# Patient Record
Sex: Female | Born: 1990 | Race: White | Hispanic: No | Marital: Married | Smoking: Never smoker
Health system: Southern US, Community
[De-identification: ages and names within clinical notes are randomized; demographics above are authoritative.]

## PROBLEM LIST (undated history)

## (undated) DIAGNOSIS — H9191 Unspecified hearing loss, right ear: Secondary | ICD-10-CM

## (undated) DIAGNOSIS — N879 Dysplasia of cervix uteri, unspecified: Secondary | ICD-10-CM

## (undated) DIAGNOSIS — F909 Attention-deficit hyperactivity disorder, unspecified type: Secondary | ICD-10-CM

## (undated) HISTORY — DX: Dysplasia of cervix uteri, unspecified: N87.9

## (undated) HISTORY — DX: Attention-deficit hyperactivity disorder, unspecified type: F90.9

## (undated) HISTORY — PX: TYMPANOPLASTY: SHX33

## (undated) HISTORY — DX: Unspecified hearing loss, right ear: H91.91

## (undated) HISTORY — PX: OTHER SURGICAL HISTORY: SHX169

---

## 2012-01-07 DIAGNOSIS — G43109 Migraine with aura, not intractable, without status migrainosus: Secondary | ICD-10-CM | POA: Insufficient documentation

## 2013-04-28 DIAGNOSIS — H9191 Unspecified hearing loss, right ear: Secondary | ICD-10-CM

## 2013-04-28 HISTORY — DX: Unspecified hearing loss, right ear: H91.91

## 2014-04-28 DIAGNOSIS — N879 Dysplasia of cervix uteri, unspecified: Secondary | ICD-10-CM

## 2014-04-28 DIAGNOSIS — R87619 Unspecified abnormal cytological findings in specimens from cervix uteri: Secondary | ICD-10-CM

## 2014-04-28 HISTORY — DX: Dysplasia of cervix uteri, unspecified: N87.9

## 2014-04-28 HISTORY — DX: Unspecified abnormal cytological findings in specimens from cervix uteri: R87.619

## 2015-04-29 HISTORY — PX: OTHER SURGICAL HISTORY: SHX169

## 2017-02-13 ENCOUNTER — Encounter: Payer: Self-pay | Admitting: Internal Medicine

## 2017-02-13 ENCOUNTER — Ambulatory Visit (INDEPENDENT_AMBULATORY_CARE_PROVIDER_SITE_OTHER): Payer: 59 | Admitting: Internal Medicine

## 2017-02-13 VITALS — BP 102/58 | HR 78 | Ht 62.0 in | Wt 131.4 lb

## 2017-02-13 DIAGNOSIS — F909 Attention-deficit hyperactivity disorder, unspecified type: Secondary | ICD-10-CM

## 2017-02-13 DIAGNOSIS — F901 Attention-deficit hyperactivity disorder, predominantly hyperactive type: Secondary | ICD-10-CM | POA: Insufficient documentation

## 2017-02-13 DIAGNOSIS — F902 Attention-deficit hyperactivity disorder, combined type: Secondary | ICD-10-CM | POA: Diagnosis not present

## 2017-02-13 DIAGNOSIS — R5383 Other fatigue: Secondary | ICD-10-CM

## 2017-02-13 DIAGNOSIS — L300 Nummular dermatitis: Secondary | ICD-10-CM | POA: Insufficient documentation

## 2017-02-13 DIAGNOSIS — R87619 Unspecified abnormal cytological findings in specimens from cervix uteri: Secondary | ICD-10-CM | POA: Diagnosis not present

## 2017-02-13 DIAGNOSIS — H9191 Unspecified hearing loss, right ear: Secondary | ICD-10-CM | POA: Diagnosis not present

## 2017-02-13 DIAGNOSIS — L309 Dermatitis, unspecified: Secondary | ICD-10-CM | POA: Diagnosis not present

## 2017-02-13 NOTE — Progress Notes (Signed)
Date:  02/13/2017   Name:  [name]   DOB:  [birth year]   MRN:  [id number]   Chief Complaint: Establish Care; Lethargic (Feeling sick for the last month. Feeling tired. Feeling like fighting off sickness for long time. Wants bloodwork to make sure everything is okay.); and ADHD (Insurance changed. Can no longer see previous psyc doctor for meds. Needs new referral.  )  HPI Fatigue - for the past month patient's been much less energetic than usual. She may have had some low-grade fevers but no other specific complaints. She has not been going to the gym and believes that she has gained about 10 pounds. There is a family history of thyroid disorder.  ADHD - diagnosed in 2014 to begin medication but had to start in grad school.  Now that she is working, only taking medication PRN when she has to do a lot of paper work.  Abnormal Pap - patient reports onset of abnormal Pap smear about 2 years ago. She has been getting follow-up Pap smears every 6 months and is about 2 months overdue. She does not know the exact diagnostic name of her cellular abnormality.   Review of Systems  Constitutional: Positive for fatigue. Negative for chills and fever.  HENT: Positive for congestion and hearing loss. Nosebleeds: right deafness.   Eyes: Negative for visual disturbance.  Respiratory: Negative for chest tightness, shortness of breath and wheezing.   Cardiovascular: Negative for chest pain, palpitations and leg swelling.  Gastrointestinal: Negative for constipation and diarrhea.  Genitourinary: Negative for dysuria.  Musculoskeletal: Negative for arthralgias and gait problem.  Skin: Positive for rash.  Neurological: Positive for weakness and headaches. Negative for dizziness and light-headedness.  Hematological: Negative for adenopathy.  Psychiatric/Behavioral: Positive for decreased concentration. Negative for dysphoric mood and sleep disturbance.    There are no active problems to  display for this patient.   Prior to Admission medications   Medication Sig Start Date End Date Taking? Authorizing Provider  dextroamphetamine (DEXTROSTAT) 10 MG tablet Take 10 mg by mouth daily.   Yes [provider]  etonogestrel (NEXPLANON) 68 MG IMPL implant 1 each by Subdermal route once.   Yes [provider]    Allergies  Allergen Reactions  . Codeine     Past Surgical History:  Procedure Laterality Date  . wisdom teeth removal      Social History  Substance Use Topics  . Smoking status: Never Smoker  . Smokeless tobacco: Never Used  . Alcohol use 1.2 oz/week    2 Glasses of wine per week     Medication list has been reviewed and updated.  PHQ 2/9 Scores 02/13/2017  PHQ - 2 Score 0    Physical Exam  Constitutional: She is oriented to person, place, and time. She appears well-developed. No distress.  HENT:  Head: Normocephalic and atraumatic.  Right Ear: Ear canal normal. Tympanic membrane is perforated.  Left Ear: Tympanic membrane and ear canal normal.  Nose: Nose normal. Right sinus exhibits no maxillary sinus tenderness. Left sinus exhibits no maxillary sinus tenderness.  Mouth/Throat: No posterior oropharyngeal edema or posterior oropharyngeal erythema.  Neck: Normal range of motion. No thyroid mass and no thyromegaly present.  Cardiovascular: Normal rate, regular rhythm, normal heart sounds and intact distal pulses.   Pulmonary/Chest: Effort normal and breath sounds normal. No respiratory distress. She has no wheezes. She has no rales.  Abdominal: Soft. Normal appearance and bowel sounds are normal. There is no tenderness.  Musculoskeletal: Normal range of motion.  Lymphadenopathy:    She has no cervical adenopathy.  Neurological: She is alert and oriented to person, place, and time. She has normal reflexes.  Skin: Skin is warm and dry. Rash noted. Rash is maculopapular (scaly pale lesions scattered on lower legs).  Psychiatric: She  has a normal mood and affect. Her behavior is normal. Thought content normal.  Nursing note and vitals reviewed.   BP (!) 102/58   Pulse 78   Ht 5\' 2"  (1.575 m)   Wt 131 lb 6.4 oz (59.6 kg)   SpO2 99%   BMI 24.03 kg/m   Assessment and Plan: 1. Abnormal cervical Papanicolaou smear, unspecified abnormal pap finding Obtain records from GYN to determine if she can have Pap with PCP or needs to go to GYN  2. Attention deficit hyperactivity disorder (ADHD), combined type Continue current medications PRN - Ambulatory referral to Psychiatry  3. Fatigue, unspecified type Likely EBV or CMV; rule out hypothyroidism Supportive care, adequate sleep - CBC with Differential/Platelet - Comprehensive metabolic panel - TSH - CMV IgM - Epstein-Barr virus VCA antibody panel  4. Dermatitis Recommend Dermatology follow up - can schedule with specialist of her choice  5. Deafness in right ear   No orders of the defined types were placed in this encounter.   Partially dictated using Animal nutritionist. Any errors are unintentional.  Bari Edward, MD [facility]  02/13/2017

## 2017-02-14 LAB — CBC WITH DIFFERENTIAL/PLATELET
BASOS ABS: 0 10*3/uL (ref 0.0–0.2)
Basos: 0 %
EOS (ABSOLUTE): 0.1 10*3/uL (ref 0.0–0.4)
Eos: 1 %
HEMOGLOBIN: 14.3 g/dL (ref 11.1–15.9)
Hematocrit: 42.5 % (ref 34.0–46.6)
IMMATURE GRANS (ABS): 0 10*3/uL (ref 0.0–0.1)
Immature Granulocytes: 0 %
LYMPHS: 36 %
Lymphocytes Absolute: 2.9 10*3/uL (ref 0.7–3.1)
MCH: 31.1 pg (ref 26.6–33.0)
MCHC: 33.6 g/dL (ref 31.5–35.7)
MCV: 92 fL (ref 79–97)
MONOCYTES: 4 %
Monocytes Absolute: 0.3 10*3/uL (ref 0.1–0.9)
NEUTROS ABS: 4.6 10*3/uL (ref 1.4–7.0)
Neutrophils: 59 %
Platelets: 319 10*3/uL (ref 150–379)
RBC: 4.6 x10E6/uL (ref 3.77–5.28)
RDW: 12.7 % (ref 12.3–15.4)
WBC: 7.9 10*3/uL (ref 3.4–10.8)

## 2017-02-14 LAB — COMPREHENSIVE METABOLIC PANEL
ALBUMIN: 4.6 g/dL (ref 3.5–5.5)
ALK PHOS: 55 IU/L (ref 39–117)
ALT: 13 IU/L (ref 0–32)
AST: 20 IU/L (ref 0–40)
Albumin/Globulin Ratio: 2 (ref 1.2–2.2)
BILIRUBIN TOTAL: 0.5 mg/dL (ref 0.0–1.2)
BUN / CREAT RATIO: 14 (ref 9–23)
BUN: 12 mg/dL (ref 6–20)
CHLORIDE: 101 mmol/L (ref 96–106)
CO2: 25 mmol/L (ref 20–29)
CREATININE: 0.85 mg/dL (ref 0.57–1.00)
Calcium: 9.9 mg/dL (ref 8.7–10.2)
GFR calc Af Amer: 110 mL/min/{1.73_m2} (ref >59)
GFR calc non Af Amer: 96 mL/min/{1.73_m2} (ref >59)
GLUCOSE: 89 mg/dL (ref 65–99)
Globulin, Total: 2.3 g/dL (ref 1.5–4.5)
POTASSIUM: 3.8 mmol/L (ref 3.5–5.2)
SODIUM: 141 mmol/L (ref 134–144)
TOTAL PROTEIN: 6.9 g/dL (ref 6.0–8.5)

## 2017-02-14 LAB — EPSTEIN-BARR VIRUS VCA ANTIBODY PANEL
EBV NA IgG: >600 U/mL — ABNORMAL HIGH (ref 0.0–17.9)
EBV VCA IgG: 170 U/mL — ABNORMAL HIGH (ref 0.0–17.9)
EBV VCA IgM: <36 U/mL (ref 0.0–35.9)

## 2017-02-14 LAB — CMV IGM

## 2017-02-14 LAB — TSH: TSH: 0.904 u[IU]/mL (ref 0.450–4.500)

## 2017-04-13 ENCOUNTER — Ambulatory Visit (INDEPENDENT_AMBULATORY_CARE_PROVIDER_SITE_OTHER): Payer: 59 | Admitting: Psychiatry

## 2017-04-13 ENCOUNTER — Other Ambulatory Visit: Payer: Self-pay

## 2017-04-13 ENCOUNTER — Encounter: Payer: Self-pay | Admitting: Psychiatry

## 2017-04-13 VITALS — BP 115/71 | HR 71 | Temp 97.9°F | Wt 132.4 lb

## 2017-04-13 DIAGNOSIS — F901 Attention-deficit hyperactivity disorder, predominantly hyperactive type: Secondary | ICD-10-CM

## 2017-04-13 MED ORDER — AMPHETAMINE-DEXTROAMPHETAMINE 10 MG PO TABS: 10.0000 mg | ORAL_TABLET | Freq: Two times a day (BID) | ORAL | 0 refills | Status: DC

## 2017-04-13 NOTE — Progress Notes (Signed)
Psychiatric Initial Adult Assessment   Patient Identification: Ronnette Rump MRN:  [id number] Date of Evaluation:  04/13/2017 Referral Source: Tia Alert  Chief Complaint:  " I have ADHD.'  Chief Complaint    Establish Care; ADHD     Visit Diagnosis:    ICD-10-CM   1. Attention deficit hyperactivity disorder (ADHD), predominantly hyperactive type F90.1 amphetamine-dextroamphetamine (ADDERALL) 10 MG tablet    History of Present Illness: Nala is a  26 year old Caucasian female, employed, married, who lives in [place], who presented to the clinic today to establish care.  Auna reports that she has a history of ADHD and she was under the care of Dr.Sarah Leighton Parody in [place].  Parisha also used to see a provider at [facility] in the past.  Marine reports that she struggles with attention and focus as well as impulsivity and hyperactivity.  She reports that she was diagnosed with ADHD when she was very little.  She reports later on she was rediagnosed when she went to grad school.  She struggles with attention and focus on a regular basis, making careless mistakes at work.  Being unable to focus, being hyperactive, interrupting others, falling back on assignments and project deadlines, being forgetful and losing things and so on.  She reports that she used to be on other medications like Dextrostat in the past.  However she is currently on Adderall which has been working well.  Last prescription was filled by her previous provider on 01/16/2017.  She reports that she has been stretching it out a little bit and she continues to have a few pills left from that prescription.  She denies any side effects to her medications.  She denies any restlessness, appetite suppression, weight loss, sleep issues from her medications.  Her vitals were reviewed and they seem to be within normal limits today.  She denies any history of sadness, mood changes, irritability, mania or hypomania,  perceptual disturbances.  She denies any history of trauma.  She denies any anxiety attacks or panic attacks.  She denies any history of alcohol or drug abuse.  She denies any history of medical problems.  She follows up with her PMD on a regular basis.  She had her TSH done recently, it was within normal limits.  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  She reports work is busy around this time of the year.  She reports she enjoys working here and she enjoys her patients.    Associated Signs/Symptoms: Depression Symptoms:  denies (Hypo) Manic Symptoms:  denies Anxiety Symptoms:  denies Psychotic Symptoms:  denies PTSD Symptoms: Negative  Past Psychiatric History: Past hx of ADHD, diagnosed when she was very little.  She was rediagnosed when she went to grad school.  She denies any history of IP mental health admissions.  She denies any history of suicide attempts.  She used to follow-up with Dr.Sarah Leighton Parody.  Previous Psychotropic Medications: Yes , dextrostat  Substance Abuse History in the last 12 months:  No.  Consequences of Substance Abuse: Negative  Past Medical History:  Past Medical History:  Diagnosis Date  . Abnormal cells of cervix 2016   Was told needs PAP every 6 months due to abnormal cells.   . Acquired deafness of right ear 2015  . ADHD     Past Surgical History:  Procedure Laterality Date  . nexplanon  2017  . wisdom teeth removal      Family Psychiatric History: Denies history of mental  health problems in her family.  Denies history of suicide in her family.  Denies history of substance abuse in family.  Family History:  Family History  Problem Relation Age of Onset  . Hypertension Father   . Hyperthyroidism Father   . Lung cancer Maternal Grandmother   . Lung cancer Paternal Grandmother   . Lung cancer Paternal Grandfather     Social History:   Social History   Socioeconomic History  . Marital status: Married    Spouse name:  jonathan  . Number of children: 0  . Years of education: None  . Highest education level: Doctorate  Social Needs  . Financial resource strain: Not hard at all  . Food insecurity - worry: Never true  . Food insecurity - inability: Never true  . Transportation needs - medical: No  . Transportation needs - non-medical: No  Occupational History    Comment: full time  Tobacco Use  . Smoking status: Never Smoker  . Smokeless tobacco: Never Used  Substance and Sexual Activity  . Alcohol use: Yes    Alcohol/week: 1.2 oz    Types: 1 Glasses of wine, 1 Cans of beer per week  . Drug use: No  . Sexual activity: Yes    Birth control/protection: Implant  Other Topics Concern  . None  Social History Narrative  . None    Additional Social History: [place] in [place]. 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 She denies having any children.  Allergies:   Allergies  Allergen Reactions  . Codeine     Metabolic Disorder Labs: No results found for: HGBA1C, MPG No results found for: PROLACTIN No results found for: CHOL, TRIG, HDL, CHOLHDL, VLDL, LDLCALC   Current Medications: Current Outpatient Medications  Medication Sig Dispense Refill  . dextroamphetamine (DEXTROSTAT) 10 MG tablet Take 10 mg by mouth daily.    Marland Kitchen. etonogestrel (NEXPLANON) 68 MG IMPL implant 1 each by Subdermal route once.    Marland Kitchen. amphetamine-dextroamphetamine (ADDERALL) 10 MG tablet Take 1 tablet (10 mg total) by mouth 2 (two) times daily. 60 tablet 0   No current facility-administered medications for this visit.     Neurologic: Headache: No Seizure: No Paresthesias:No  Musculoskeletal: Strength & Muscle Tone: within normal limits Gait & Station: normal Patient leans: N/A  Psychiatric Specialty Exam: Review of Systems  Psychiatric/Behavioral: Negative for depression, hallucinations, substance abuse and suicidal ideas. The patient does not have insomnia.   All other systems reviewed  and are negative.   Blood pressure 115/71, pulse 71, temperature 97.9 F (36.6 C), temperature source Oral, weight 132 lb 6.4 oz (60.1 kg).Body mass index is 24.22 kg/m.  General Appearance: Casual  Eye Contact:  Fair  Speech:  Clear and Coherent  Volume:  Normal  Mood:  euthymic  Affect:  Congruent  Thought Process:  Goal Directed and Descriptions of Associations: Intact  Orientation:  Full (Time, Place, and Person)  Thought Content:  Logical  Suicidal Thoughts:  No  Homicidal Thoughts:  No  Memory:  Immediate;   Fair Recent;   Fair Remote;   Fair  Judgement:  Fair  Insight:  Fair  Psychomotor Activity:  Normal  Concentration:  Concentration: Fair and Attention Span: Fair  Recall:  FiservFair  Fund of Knowledge:Fair  Language: Fair  Akathisia:  No  Handed:  Right  AIMS (if indicated):  NA  Assets:  Communication Skills Desire for Improvement Financial Resources/Insurance Housing Intimacy Leisure Time Physical Health Resilience  Social Support Energy managerTalents/Skills Transportation Vocational/Educational  ADL's:  Intact  Cognition: WNL  Sleep: Fair    Treatment Plan Summary:Harolyn is a 26 year old Caucasian female, who is employed, married, who has a history of ADHD, who presented to the clinic to establish care.  Jearld AdjutantMarina denied any anxiety or other mood symptoms at this time.  She denies a history of trauma.  She denies a history of substance use.  She denies any mental health problems in her family.  She denies any suicidality at this time.  She reports her medications as effective.  Discussed that we need to obtain medical records from her previous provider and she will sign a release today.  Plan as noted below. Medication management and Plan see below Plan  ADHD, hyperactive type Continue Adderall 10 mg p.o. twice daily. We will obtain medical records from her previous provider, she will sign a release.  I have reviewed [place] controlled substance database.  Reviewed vital  signs, within normal limits.  Reviewed TSH - 02/13/2017 - wnl.  Provided medication education provided handouts.  Follow-up in 4 weeks or sooner if needed.  This note was generated in part or whole with voice recognition software. Voice recognition is usually quite accurate but there are transcription errors that can and very often do occur. I apologize for any typographical errors that were not detected and corrected.       Jomarie LongsSaramma Vaanya Shambaugh, MD 12/17/20181:29 PM

## 2017-05-06 ENCOUNTER — Ambulatory Visit: Payer: 59 | Attending: Internal Medicine | Admitting: Occupational Therapy

## 2017-05-06 DIAGNOSIS — M25531 Pain in right wrist: Secondary | ICD-10-CM | POA: Insufficient documentation

## 2017-05-06 NOTE — Therapy (Signed)
[facility] [address] [place], [place], [address] Phone: [phone]   Fax:  [phone]  Occupational Therapy screen  Patient Details  Name: Susan Hoffman MRN: [id number] Date of Birth: 02/01/1991 No Data Recorded  Encounter Date: 05/06/2017    Past Medical History:  Diagnosis Date  . Abnormal cells of cervix 2016   Was told needs PAP every 6 months due to abnormal cells.   . Acquired deafness of right ear 2015  . ADHD     Past Surgical History:  Procedure Laterality Date  . nexplanon  2017  . wisdom teeth removal      There were no vitals filed for this visit.  Subjective Assessment - 05/06/17 1249    Subjective   I walked somebody with gaitbelt about 2 wks ago -and then thy jerk on it and my wrist twitch -and since then hurting and more lately - with weight bearing and gripping , twisting with some popping     Patient Stated Goals  I just don't want it to get worse     Currently in Pain?  -- Pain at rest 0/10 , but after using it or weight bear can go to 7/10        1/10 pain with AROM wrist flexion and extention end range  No pain with AROM RD,UD, sup and pronation -some popping and clicking per pt - but had some prior   Tender over ulnar side of wrist at [facility] - but 1/10  And radial side of wrist  With joint mobs at distal ulna and radial joint some pain but not with traction or compression  Pain with resistance of thumb flexion and ADD   some pain with gripping on volar wrist  Grip on R dominant hand 50 and L55   Recommend for pt to wear Benik neoprene splint most all the time with act - for compression and support at end range  Avoid weight bearing , gripping , pulling  And can do ionto with dexamethazone or pulse US - 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 Abnormal Pap smear of cervix 02/13/2017  . ADHD 02/13/2017  . Dermatitis 02/13/2017  . Deafness in right ear 02/13/2017    Oletta CohnuPreez, Austan Nicholl OTR/L,CLT 05/06/2017, 12:52 PM  [facility] [address] [place], [place], [address] Phone: [phone]   Fax:  [phone]  Name: Susan Hoffman MRN: [id number] Date of Birth: 02/01/1991

## 2017-05-08 ENCOUNTER — Other Ambulatory Visit: Payer: Self-pay

## 2017-05-08 ENCOUNTER — Encounter: Payer: Self-pay | Admitting: Psychiatry

## 2017-05-08 ENCOUNTER — Ambulatory Visit (INDEPENDENT_AMBULATORY_CARE_PROVIDER_SITE_OTHER): Payer: 59 | Admitting: Psychiatry

## 2017-05-08 DIAGNOSIS — F901 Attention-deficit hyperactivity disorder, predominantly hyperactive type: Secondary | ICD-10-CM | POA: Diagnosis not present

## 2017-05-08 MED ORDER — AMPHETAMINE-DEXTROAMPHETAMINE 10 MG PO TABS: 10.0000 mg | ORAL_TABLET | Freq: Two times a day (BID) | ORAL | 0 refills | Status: DC

## 2017-05-08 NOTE — Progress Notes (Signed)
BH MD OP Progress Note  05/08/2017 12:22 PM Susan Hoffman  MRN:  [id number]  Chief Complaint: ' I am ok.'  Chief Complaint    Follow-up; Medication Refill     HPI: Susan Hoffman a 27 year old Caucasian female, employed, married, lives in [place], presented to the clinic today for a follow-up visit.  Susan Hoffman currently is on Adderall for her ADHD symptoms.  She reports she has been doing well on the current dose.  She reports her focus and attention is better.  She denies any impulsivity or hyperactivity.  She denies any side effects to the medication like appetite changes, anxiety, sleep issues.  She reports her mood is okay.  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  She has a sprain to her right forearm.  She currently wears braces.  She reports she has to take pain medications for couple of days and also give her arm rest.  She denies any  new concerns.  She denies abusing any drugs or alcohol. Visit Diagnosis:    ICD-10-CM   1. Attention deficit hyperactivity disorder (ADHD), predominantly hyperactive type F90.1 amphetamine-dextroamphetamine (ADDERALL) 10 MG tablet    DISCONTINUED: amphetamine-dextroamphetamine (ADDERALL) 10 MG tablet    DISCONTINUED: amphetamine-dextroamphetamine (ADDERALL) 10 MG tablet    Past Psychiatric History: History of ADHD, diagnosed when she was very little.  She was rediagnosed when she went to grad school.  She denies any history of IP mental health admissions.  She denies any history of suicide attempts.  She used to follow up with Dr. Mahala MenghiniSarah Bryce in the past.  Previous trials of Dextrostat.  Past Medical History:  Past Medical History:  Diagnosis Date  . Abnormal cells of cervix 2016   Was told needs PAP every 6 months due to abnormal cells.   . Acquired deafness of right ear 2015  . ADHD     Past Surgical History:  Procedure Laterality Date  . nexplanon  2017  . wisdom teeth removal      Family Psychiatric  History: Denies history of mental health problems in her family.  Denies history of suicide in her family.  Denies history of substance abuse in her family  Family History:  Family History  Problem Relation Age of Onset  . Hypertension Father   . Hyperthyroidism Father   . Lung cancer Maternal Grandmother   . Lung cancer Paternal Grandmother   . Lung cancer Paternal Grandfather    Substance abuse history: Denies   Social History: She lives with her husband.  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  She denies having any children.  Her husband is currently in school and also in [employer] and they would hence like to wait another 3-4 years off before having children Social History   Socioeconomic History  . Marital status: Married    Spouse name: jonathan  . Number of children: 0  . Years of education: None  . Highest education level: Doctorate  Social Needs  . Financial resource strain: Not hard at all  . Food insecurity - worry: Never true  . Food insecurity - inability: Never true  . Transportation needs - medical: No  . Transportation needs - non-medical: No  Occupational History    Comment: full time  Tobacco Use  . Smoking status: Never Smoker  . Smokeless tobacco: Never Used  Substance and Sexual Activity  . Alcohol use: Yes    Alcohol/week: 1.2 oz    Types: 1  Glasses of wine, 1 Cans of beer per week  . Drug use: No  . Sexual activity: Yes    Birth control/protection: Implant  Other Topics Concern  . None  Social History Narrative  . None    Allergies:  Allergies  Allergen Reactions  . Codeine     Metabolic Disorder Labs: No results found for: HGBA1C, MPG No results found for: PROLACTIN No results found for: CHOL, TRIG, HDL, CHOLHDL, VLDL, LDLCALC Lab Results  Component Value Date   TSH 0.904 02/13/2017    Therapeutic Level Labs: No results found for: LITHIUM No results found for: VALPROATE No components found for:  CBMZ  Current  Medications: Current Outpatient Medications  Medication Sig Dispense Refill  . amphetamine-dextroamphetamine (ADDERALL) 10 MG tablet Take 1 tablet (10 mg total) by mouth 2 (two) times daily. 60 tablet 0  . etonogestrel (NEXPLANON) 68 MG IMPL implant 1 each by Subdermal route once.     No current facility-administered medications for this visit.      Musculoskeletal: Strength & Muscle Tone: within normal limits Gait & Station: normal Patient leans: N/A  Psychiatric Specialty Exam: Review of Systems  Musculoskeletal: Positive for joint pain (recent sprain of her forearm , wears braces).  All other systems reviewed and are negative.   Blood pressure 128/80, pulse (!) 56, temperature 98.5 F (36.9 C), temperature source Oral, weight 131 lb 12.8 oz (59.8 kg).Body mass index is 24.11 kg/m.  General Appearance: Casual  Eye Contact:  Fair  Speech:  Clear and Coherent  Volume:  Normal  Mood:  Euthymic  Affect:  Congruent  Thought Process:  Goal Directed and Descriptions of Associations: Intact  Orientation:  Full (Time, Place, and Person)  Thought Content: Logical   Suicidal Thoughts:  No  Homicidal Thoughts:  No  Memory:  Immediate;   Fair Recent;   Fair Remote;   Fair  Judgement:  Fair  Insight:  Fair  Psychomotor Activity:  Normal  Concentration:  Concentration: Fair and Attention Span: Fair  Recall:  [employer] of Knowledge: Fair  Language: Fair  Akathisia:  No  Handed:  Right  AIMS (if indicated): NA  Assets:  Communication Skills Desire for Improvement Financial Resources/Insurance Housing Intimacy Leisure Time Physical Health Resilience Social Support Talents/Skills Transportation Vocational/Educational  ADL's:  Intact  Cognition: WNL  Sleep:  Fair   Screenings: PHQ2-9     Office Visit from 02/13/2017 in [facility]  PHQ-2 Total Score  0       Assessment and Plan: Susan Hoffman is a 27 year old Caucasian female who is employed, married, has a  history of ADHD, presented to the clinic for a follow-up visit.  Patient currently reports she is stable on the current dose of Adderall.  She denies any side effects.  Denies any mood symptoms.  Denies any substance abuse problems.  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  Plan as noted below.  Plan ADHD, hyperactive type Continue Adderall 10 mg p.o. twice daily.  Provided 3 scripts with date specified. Pending Medical records from previous provider. Reviewed [place] controlled substance database.  Vital signs reviewed-within normal limits.  Provided medication education, provided handouts.  Follow-up in 2-3 months or sooner if needed  More than 50 % of the time was spent for psychoeducation and supportive psychotherapy and care coordination.  This note was generated in part or whole with voice recognition software. Voice recognition is usually quite accurate but there are transcription errors that  can and very often do occur. I apologize for any typographical errors that were not detected and corrected.      Jomarie Longs, MD 05/08/2017, 12:22 PM

## 2017-06-04 ENCOUNTER — Telehealth: Payer: Self-pay | Admitting: Physician Assistant

## 2017-06-04 ENCOUNTER — Other Ambulatory Visit: Payer: Self-pay

## 2017-06-04 ENCOUNTER — Ambulatory Visit
Admission: EM | Admit: 2017-06-04 | Discharge: 2017-06-04 | Disposition: A | Discharge status: Home / Self Care | Payer: 59 | Attending: Family Medicine | Admitting: Family Medicine

## 2017-06-04 ENCOUNTER — Ambulatory Visit (INDEPENDENT_AMBULATORY_CARE_PROVIDER_SITE_OTHER): Payer: 59

## 2017-06-04 DIAGNOSIS — R079 Chest pain, unspecified: Secondary | ICD-10-CM | POA: Diagnosis not present

## 2017-06-04 DIAGNOSIS — R42 Dizziness and giddiness: Secondary | ICD-10-CM

## 2017-06-04 DIAGNOSIS — R0602 Shortness of breath: Secondary | ICD-10-CM

## 2017-06-04 DIAGNOSIS — Z01810 Encounter for preprocedural cardiovascular examination: Secondary | ICD-10-CM | POA: Diagnosis not present

## 2017-06-04 LAB — COMPREHENSIVE METABOLIC PANEL
ALK PHOS: 49 U/L (ref 38–126)
ALT: 16 U/L (ref 14–54)
AST: 22 U/L (ref 15–41)
Albumin: 4.6 g/dL (ref 3.5–5.0)
Anion gap: 6 (ref 5–15)
BILIRUBIN TOTAL: 1.1 mg/dL (ref 0.3–1.2)
BUN: 10 mg/dL (ref 6–20)
CALCIUM: 9.3 mg/dL (ref 8.9–10.3)
CO2: 26 mmol/L (ref 22–32)
CREATININE: 0.92 mg/dL (ref 0.44–1.00)
Chloride: 104 mmol/L (ref 101–111)
Glucose, Bld: 105 mg/dL — ABNORMAL HIGH (ref 65–99)
Potassium: 3.3 mmol/L — ABNORMAL LOW (ref 3.5–5.1)
Sodium: 136 mmol/L (ref 135–145)
Total Protein: 7.2 g/dL (ref 6.5–8.1)

## 2017-06-04 LAB — CBC WITH DIFFERENTIAL/PLATELET
BASOS ABS: 0 10*3/uL (ref 0–0.1)
Basophils Relative: 0 %
EOS PCT: 1 %
Eosinophils Absolute: 0.1 10*3/uL (ref 0–0.7)
HEMATOCRIT: 41.3 % (ref 35.0–47.0)
HEMOGLOBIN: 14.3 g/dL (ref 12.0–16.0)
LYMPHS ABS: 3.4 10*3/uL (ref 1.0–3.6)
LYMPHS PCT: 51 %
MCH: 31.7 pg (ref 26.0–34.0)
MCHC: 34.7 g/dL (ref 32.0–36.0)
MCV: 91.3 fL (ref 80.0–100.0)
Monocytes Absolute: 0.4 10*3/uL (ref 0.2–0.9)
Monocytes Relative: 6 %
NEUTROS ABS: 2.9 10*3/uL (ref 1.4–6.5)
NEUTROS PCT: 42 %
Platelets: 313 10*3/uL (ref 150–440)
RBC: 4.52 MIL/uL (ref 3.80–5.20)
RDW: 12.1 % (ref 11.5–14.5)
WBC: 6.8 10*3/uL (ref 3.6–11.0)

## 2017-06-04 LAB — FIBRIN DERIVATIVES D-DIMER (ARMC ONLY): FIBRIN DERIVATIVES D-DIMER (ARMC): 114.21 ng{FEU}/mL (ref 0.00–499.00)

## 2017-06-04 MED ORDER — POTASSIUM CHLORIDE CRYS ER 20 MEQ PO TBCR: 20.0000 meq | EXTENDED_RELEASE_TABLET | Freq: Two times a day (BID) | ORAL | 0 refills | Status: DC

## 2017-06-04 NOTE — Discharge Instructions (Signed)
CBC normal.  Metabolic panel normal except for slightly low potassium. Chest xray normal. Rest. If persists, see [facility]  Take care  Dr. Adriana Simasook

## 2017-06-04 NOTE — ED Triage Notes (Signed)
Pt c/o dyspnea. Sx started with a cold about 1.5 weeks ago, and included productive cough. States the cough has resolved. States she is experiencing exertional chest pain described as a "pressure". Denies N/V, neck pain (but is c/o neck stiffness), denies back, arm pain.

## 2017-06-04 NOTE — ED Provider Notes (Signed)
[facility]    CSN: [id number] Arrival date & time: 06/04/17  1811  History   Chief Complaint Chief Complaint  Patient presents with  . Shortness of Breath   HPI  27 year old female presents with shortness of breath.  Patient states that a week and a half ago she had a typical cold.  She states she had cough, runny nose, and fatigue.  She states that she improved.  However, she has had persistent shortness of breath.  She has been doing her normal physical activity which includes a 3 mile run prior to her workout.  She states that she has been progressively getting more short of breath.  She finds her self shortening her run as she cannot complete it.  She states that she feels short of breath.  She reports chest tightness.  Has been worsening and was worse as of yesterday and today.  She reports that she has had some dizziness/lightheadedness as well.  No reports of fevers or chills.  No cough.  No reports of palpitations.  She is very physically active and very healthy at baseline.  She uses Nexplanon for birth control.  She has recently traveled to [place] well as [place].  No reports of calf swelling/Pain.  No other reported symptoms.  No other complaints or concerns at this time.  Past Medical History:  Diagnosis Date  . Abnormal cells of cervix 2016   Was told needs PAP every 6 months due to abnormal cells.   . Acquired deafness of right ear 2015  . ADHD    Patient Active Problem List   Diagnosis Date Noted  . Abnormal Pap smear of cervix 02/13/2017  . ADHD 02/13/2017  . Dermatitis 02/13/2017  . Deafness in right ear 02/13/2017   Past Surgical History:  Procedure Laterality Date  . nexplanon  2017  . wisdom teeth removal     OB History    Gravida Para Term Preterm AB Living   0 0 0 0 0 0   SAB TAB Ectopic Multiple Live Births   0 0 0 0 0     Home Medications    Prior to Admission medications   Medication Sig Start Date End Date Taking?  Authorizing Provider  amphetamine-dextroamphetamine (ADDERALL) 10 MG tablet Take 1 tablet (10 mg total) by mouth 2 (two) times daily. 05/08/17  Yes Jomarie LongsEappen, Saramma, MD  etonogestrel (NEXPLANON) 68 MG IMPL implant 1 each by Subdermal route once.   Yes [provider]  potassium chloride SA (K-DUR,KLOR-CON) 20 MEQ tablet Take 1 tablet (20 mEq total) by mouth 2 (two) times daily for 2 days. 06/04/17 06/06/17  Tommie Samsook, Elsi Stelzer G, DO    Family History Family History  Problem Relation Age of Onset  . Hypertension Father   . Hyperthyroidism Father   . Lung cancer Maternal Grandmother   . Lung cancer Paternal Grandmother   . Lung cancer Paternal Grandfather     Social History Social History   Tobacco Use  . Smoking status: Never Smoker  . Smokeless tobacco: Never Used  Substance Use Topics  . Alcohol use: Yes    Alcohol/week: 1.2 oz    Types: 1 Glasses of wine, 1 Cans of beer per week  . Drug use: No     Allergies   Codeine   Review of Systems Review of Systems  Constitutional: Negative.   Respiratory: Positive for chest tightness and shortness of breath.   Neurological: Positive for dizziness and light-headedness.   Physical  Exam Triage Vital Signs ED Triage Vitals  Enc Vitals Group     BP 06/04/17 1852 116/73     Pulse Rate 06/04/17 1852 66     Resp 06/04/17 1852 16     Temp 06/04/17 1852 98.2 F (36.8 C)     Temp Source 06/04/17 1852 Oral     SpO2 06/04/17 1852 100 %     Weight 06/04/17 1853 128 lb 6.4 oz (58.2 kg)     Height 06/04/17 1853 5\' 2"  (1.575 m)     Head Circumference --      Peak Flow --      Pain Score --      Pain Loc --      Pain Edu? --      Excl. in GC? --    Updated Vital Signs BP 116/73 (BP Location: Left Arm)   Pulse 66   Temp 98.2 F (36.8 C) (Oral)   Resp 16   Ht 5\' 2"  (1.575 m)   Wt 128 lb 6.4 oz (58.2 kg)   LMP 05/21/2017   SpO2 100%   BMI 23.48 kg/m    Physical Exam  Constitutional: She is oriented to person, place, and time.  She appears well-developed. No distress.  HENT:  Head: Normocephalic and atraumatic.  Mouth/Throat: Oropharynx is clear and moist.  Eyes: Conjunctivae are normal. Right eye exhibits no discharge. Left eye exhibits no discharge.  Neck: Neck supple.  Cardiovascular: Normal rate and regular rhythm.  No murmur heard. Pulmonary/Chest: Effort normal and breath sounds normal. She has no wheezes. She has no rales.  Abdominal: Soft. She exhibits no distension. There is no tenderness.  Lymphadenopathy:    She has no cervical adenopathy.  Neurological: She is alert and oriented to person, place, and time.  Skin: Skin is warm. No rash noted.  Psychiatric: She has a normal mood and affect. Her behavior is normal.  Nursing note and vitals reviewed.  UC Treatments / Results  Labs (all labs ordered are listed, but only abnormal results are displayed) Labs Reviewed  COMPREHENSIVE METABOLIC PANEL - Abnormal; Notable for the following components:      Result Value   Potassium 3.3 (*)    Glucose, Bld 105 (*)    All other components within normal limits  CBC WITH DIFFERENTIAL/PLATELET  FIBRIN DERIVATIVES D-DIMER [facility] ONLY)    EKG   Date: 06/04/2017  EKG Time: 8:49 PM  Rate: 62  Rhythm: Normal sinus rhythm with sinus arrhythmia.  Axis: Normal.  Intervals:Normal  ST&T Change: No  Narrative Interpretation: Normal sinus rhythm at a rate of 62.  Sinus arrhythmia.  Normal axis. Normal EKG.  Radiology Dg Chest 2 View  Result Date: 06/04/2017 CLINICAL DATA:  Shortness of breath. EXAM: CHEST  2 VIEW COMPARISON:  None. FINDINGS: The heart size and mediastinal contours are within normal limits. Both lungs are clear. The visualized skeletal structures are unremarkable. IMPRESSION: No active cardiopulmonary disease. Electronically Signed   By: Gerome Sam III M.D   On: 06/04/2017 20:09   Procedures Procedures (including critical care time)  Medications Ordered in UC Medications - No data to  display   Initial Impression / Assessment and Plan / UC Course  I have reviewed the triage vital signs and the nursing notes.  Pertinent labs & imaging results that were available during my care of the patient were reviewed by me and considered in my medical decision making (see chart for details).     27 year old female presents  with shortness of breath.  Etiology remains unclear.  Chest x-ray negative.  CBC normal.  Mild hypokalemia which I will replete.  EKG unremarkable.  Advised rest and supportive care.  If persists, need to see pulmonology.  Final Clinical Impressions(s) / UC Diagnoses   Final diagnoses:  SOB (shortness of breath)    ED Discharge Orders        Ordered    potassium chloride SA (K-DUR,KLOR-CON) 20 MEQ tablet  2 times daily     06/04/17 2044     Controlled Substance Prescriptions [place] Controlled Substance Registry consulted? Not Applicable   Tommie Sams, DO 06/04/17 2050

## 2017-06-04 NOTE — Progress Notes (Signed)
Based on what you shared with me it looks like you have a serious condition that should be evaluated in a face to face office visit.  NOTE: If you entered your credit card information for this eVisit, you will not be charged. You may see a "hold" on your card for the $30 but that hold will drop off and you will not have a charge processed.  If you are having a true medical emergency please call 911.  If you need an urgent face to face visit, [facility] has four urgent care centers for your convenience.  If you need care fast and have a high deductible or no insurance consider:   [email or web address] to reserve your spot online an avoid wait times  [facility] [address], [address] [place], [place] [address] 8 am to 8 pm Monday-Friday 10 am to 4 pm Saturday-Sunday *Across the street from [employer]  [facility]  [address] [place], [address] 8 am to 5 pm Monday-Friday * In the [facility]   The following sites will take your  insurance:  . [facility]  [phone] Get Driving Directions Find a Provider at this Location  [address] [place], [place] [address] . 10 am to 8 pm Monday-Friday . 12 pm to 8 pm Saturday-Sunday   . [facility]  [phone] Get Driving Directions Find a Provider at this Location  [address], [address] [place], [place] [address] . 8 am to 8 pm Monday-Friday . 9 am to 6 pm Saturday . 11 am to 6 pm Sunday   . [facility] [place]  [phone] Get Driving Directions  [address] [place], [place] [address] . 8 am to 8 pm Monday-Friday . 8 am to 4 pm Saturday-Sunday   Your e-visit answers were reviewed by a board certified advanced clinical practitioner to complete your personal care plan.  Thank you for using e-Visits.  

## 2017-06-23 ENCOUNTER — Encounter: Payer: Self-pay | Admitting: Internal Medicine

## 2017-06-23 ENCOUNTER — Ambulatory Visit (INDEPENDENT_AMBULATORY_CARE_PROVIDER_SITE_OTHER): Payer: 59 | Admitting: Internal Medicine

## 2017-06-23 VITALS — BP 118/68 | HR 119 | Ht 62.0 in | Wt 131.0 lb

## 2017-06-23 DIAGNOSIS — R87619 Unspecified abnormal cytological findings in specimens from cervix uteri: Secondary | ICD-10-CM

## 2017-06-23 DIAGNOSIS — Z0001 Encounter for general adult medical examination with abnormal findings: Secondary | ICD-10-CM | POA: Diagnosis not present

## 2017-06-23 DIAGNOSIS — Z3042 Encounter for surveillance of injectable contraceptive: Secondary | ICD-10-CM

## 2017-06-23 DIAGNOSIS — E876 Hypokalemia: Secondary | ICD-10-CM | POA: Diagnosis not present

## 2017-06-23 DIAGNOSIS — R0602 Shortness of breath: Secondary | ICD-10-CM

## 2017-06-23 DIAGNOSIS — Z0000 Encounter for general adult medical examination without abnormal findings: Secondary | ICD-10-CM | POA: Diagnosis not present

## 2017-06-23 LAB — POCT URINALYSIS DIPSTICK
Bilirubin, UA: NEGATIVE
Blood, UA: NEGATIVE
Glucose, UA: NEGATIVE
Ketones, UA: NEGATIVE
Leukocytes, UA: NEGATIVE
NITRITE UA: NEGATIVE
PH UA: 6.5 (ref 5.0–8.0)
PROTEIN UA: NEGATIVE
Spec Grav, UA: 1.01 (ref 1.010–1.025)
UROBILINOGEN UA: 0.2 U/dL

## 2017-06-23 NOTE — Progress Notes (Signed)
Date:  06/23/2017   Name:  Susan Hoffman   DOB:  03-16-1991   MRN:  [id number]   Chief Complaint: Annual Exam (Breast and Papsmear. Needs pap every 6 months for abnormal cells on cervix. ) and Referral (Lives in [place]. [facility]. )  Susan KindsMarina Natasha Delprado is a 27 y.o. female who presents today for her Complete Annual Exam. She feels fairly well. She reports exercising none currently due to shortness of breath. She reports she is sleeping well.  She denies breast problems.  SOB - started several weeks ago.  First with extreme exercise then with less exertion and sometimes now at rest.  She was seen at ED;  D-dimer was negative and other labs normal except for mildly decreased potassium.  She has been referred to Pulmonology.  Contraception - she has Nexplanon which needs to be replaced this year.  She has intermittent menstrual bleeding but no other concerns.  [facility] in [place].  Abnormal Pap - overdue for repeat Pap.  She had abnormal cells and previous biopsy and was just scheduled for yearly or q 6 mo repeats.  Review of Systems  Constitutional: Negative for chills, fatigue and fever.  HENT: Negative for congestion, hearing loss, tinnitus, trouble swallowing and voice change.   Eyes: Negative for visual disturbance.  Respiratory: Positive for chest tightness and shortness of breath. Negative for cough and wheezing.   Cardiovascular: Negative for chest pain, palpitations and leg swelling.  Gastrointestinal: Negative for abdominal pain, constipation, diarrhea and vomiting.  Endocrine: Negative for polydipsia and polyuria.  Genitourinary: Negative for dysuria, frequency, genital sores, vaginal bleeding and vaginal discharge.  Musculoskeletal: Negative for arthralgias, gait problem and joint swelling.  Skin: Negative for color change and rash.  Neurological: Positive for dizziness and  headaches. Negative for tremors and light-headedness.  Hematological: Negative for adenopathy. Does not bruise/bleed easily.  Psychiatric/Behavioral: Negative for dysphoric mood and sleep disturbance. The patient is not nervous/anxious.     Patient Active Problem List   Diagnosis Date Noted  . Abnormal Pap smear of cervix 02/13/2017  . ADHD 02/13/2017  . Dermatitis 02/13/2017  . Deafness in right ear 02/13/2017    Prior to Admission medications   Medication Sig Start Date End Date Taking? Authorizing Provider  amphetamine-dextroamphetamine (ADDERALL) 10 MG tablet Take 1 tablet (10 mg total) by mouth 2 (two) times daily. 05/08/17  Yes Jomarie LongsEappen, Saramma, MD  etonogestrel (NEXPLANON) 68 MG IMPL implant 1 each by Subdermal route once.   Yes [provider]    Allergies  Allergen Reactions  . Codeine     Past Surgical History:  Procedure Laterality Date  . nexplanon  2017  . wisdom teeth removal      Social History   Tobacco Use  . Smoking status: Never Smoker  . Smokeless tobacco: Never Used  Substance Use Topics  . Alcohol use: Yes    Alcohol/week: 1.2 oz    Types: 1 Glasses of wine, 1 Cans of beer per week  . Drug use: No     Medication list has been reviewed and updated.  PHQ 2/9 Scores 02/13/2017  PHQ - 2 Score 0    Physical Exam  Constitutional: She is oriented to person, place, and time. She appears well-developed and well-nourished. No distress.  HENT:  Head: Normocephalic and atraumatic.  Right Ear: Tympanic membrane and ear canal normal.  Left Ear: Tympanic membrane and  ear canal normal.  Nose: Right sinus exhibits no maxillary sinus tenderness. Left sinus exhibits no maxillary sinus tenderness.  Mouth/Throat: Uvula is midline and oropharynx is clear and moist.  Eyes: Conjunctivae and EOM are normal. Right eye exhibits no discharge. Left eye exhibits no discharge. No scleral icterus.  Neck: Normal range of motion. Carotid bruit is not present. No  erythema present. No thyromegaly present.  Cardiovascular: Normal rate, regular rhythm, normal heart sounds and normal pulses.  Pulmonary/Chest: Effort normal. No respiratory distress. She has no wheezes. Right breast exhibits no mass, no nipple discharge, no skin change and no tenderness. Left breast exhibits no mass, no nipple discharge, no skin change and no tenderness.  Abdominal: Soft. Bowel sounds are normal. There is no hepatosplenomegaly. There is no tenderness. There is no CVA tenderness.  Genitourinary: Vagina normal and uterus normal. There is no tenderness, lesion or injury on the right labia. There is no tenderness, lesion or injury on the left labia. Cervix exhibits no motion tenderness, no discharge and no friability. Right adnexum displays no mass, no tenderness and no fullness. Left adnexum displays no mass, no tenderness and no fullness.  Musculoskeletal: Normal range of motion.  Lymphadenopathy:    She has no cervical adenopathy.    She has no axillary adenopathy.  Neurological: She is alert and oriented to person, place, and time. She has normal reflexes. No cranial nerve deficit or sensory deficit.  Skin: Skin is warm, dry and intact. No rash noted.  Psychiatric: She has a normal mood and affect. Her speech is normal and behavior is normal. Thought content normal.  Nursing note and vitals reviewed.   BP 118/68   Pulse (!) 119   Ht 5\' 2"  (1.575 m)   Wt 131 lb (59.4 kg)   SpO2 100%   BMI 23.96 kg/m   Assessment and Plan: 1. Annual physical exam Normal exam - Comprehensive metabolic panel - Lipid panel - POCT urinalysis dipstick  2. Abnormal cervical Papanicolaou smear, unspecified abnormal pap finding Repeated today - Pap IG w/ reflex to HPV when ASC-U  3. Low blood potassium Seen at ED last week - took supplement for a week - Comprehensive metabolic panel  4. Depot contraception Due to be replaced - Ambulatory referral to Obstetrics / Gynecology  5.  Shortness of breath at rest Seeing Pulmonary shortly  No orders of the defined types were placed in this encounter.   Partially dictated using Animal nutritionist. Any errors are unintentional.  Bari Edward, MD [facility]  06/23/2017

## 2017-06-23 NOTE — Patient Instructions (Signed)

## 2017-06-24 ENCOUNTER — Encounter: Payer: Self-pay | Admitting: Pulmonary Disease

## 2017-06-24 ENCOUNTER — Ambulatory Visit (INDEPENDENT_AMBULATORY_CARE_PROVIDER_SITE_OTHER): Payer: 59 | Admitting: Pulmonary Disease

## 2017-06-24 VITALS — BP 110/68 | HR 80 | Ht 62.0 in | Wt 131.0 lb

## 2017-06-24 DIAGNOSIS — R0609 Other forms of dyspnea: Secondary | ICD-10-CM

## 2017-06-24 DIAGNOSIS — R0600 Dyspnea, unspecified: Secondary | ICD-10-CM

## 2017-06-24 LAB — COMPREHENSIVE METABOLIC PANEL
A/G RATIO: 2.2 (ref 1.2–2.2)
ALK PHOS: 49 IU/L (ref 39–117)
ALT: 10 IU/L (ref 0–32)
AST: 16 IU/L (ref 0–40)
Albumin: 4.6 g/dL (ref 3.5–5.5)
BILIRUBIN TOTAL: 0.3 mg/dL (ref 0.0–1.2)
BUN/Creatinine Ratio: 10 (ref 9–23)
BUN: 9 mg/dL (ref 6–20)
CHLORIDE: 104 mmol/L (ref 96–106)
CO2: 22 mmol/L (ref 20–29)
Calcium: 9.2 mg/dL (ref 8.7–10.2)
Creatinine, Ser: 0.86 mg/dL (ref 0.57–1.00)
GFR calc Af Amer: 108 mL/min/{1.73_m2} (ref >59)
GFR, EST NON AFRICAN AMERICAN: 94 mL/min/{1.73_m2} (ref >59)
Globulin, Total: 2.1 g/dL (ref 1.5–4.5)
Glucose: 89 mg/dL (ref 65–99)
POTASSIUM: 4.1 mmol/L (ref 3.5–5.2)
Sodium: 141 mmol/L (ref 134–144)
Total Protein: 6.7 g/dL (ref 6.0–8.5)

## 2017-06-24 LAB — LIPID PANEL
CHOL/HDL RATIO: 2.3 ratio (ref 0.0–4.4)
CHOLESTEROL TOTAL: 158 mg/dL (ref 100–199)
HDL: 70 mg/dL (ref >39)
LDL Calculated: 76 mg/dL (ref 0–99)
TRIGLYCERIDES: 61 mg/dL (ref 0–149)
VLDL Cholesterol Cal: 12 mg/dL (ref 5–40)

## 2017-06-24 MED ORDER — BUDESONIDE-FORMOTEROL FUMARATE 80-4.5 MCG/ACT IN AERO: 2.0000 | INHALATION_SPRAY | Freq: Two times a day (BID) | RESPIRATORY_TRACT | 12 refills | Status: DC

## 2017-06-24 MED ORDER — ALBUTEROL SULFATE HFA 108 (90 BASE) MCG/ACT IN AERS: 1.0000 | INHALATION_SPRAY | Freq: Four times a day (QID) | RESPIRATORY_TRACT | 10 refills | Status: DC | PRN

## 2017-06-24 NOTE — Patient Instructions (Signed)
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  Therefore, we will initiate treatment for this presumptive diagnosis.  I have prescribed Symbicort-2 inhalations twice a day and albuterol inhaler, 1-2 puffs to be used as needed for shortness of breath or chest tightness.  You may also use the albuterol prior to exercise.  Lung function tests (PFTs) have been ordered.  Follow-up in 2-3 weeks.

## 2017-06-24 NOTE — Progress Notes (Signed)
PULMONARY CONSULT NOTE  Requesting MD/Service: Everlene Other, MD Date of initial consultation: 06/24/17 Reason for consultation: Dyspnea  PT PROFILE: 27 y.o. female never smoker, very athletic woman referred for evaluation of 4-5 weeks of exertional dyspnea  HPI:  As above.  Her onset of symptoms started with a "cold" approximately 1 month ago when she had symptoms of cough, sneezing, fatigue for 2-3 days.  Since that time, she has noted increased exertional dyspnea.  She is extremely active and athletic.  She plays competitive soccer and exercises regularly.  In the course of these activities, she notes chest heaviness and tightness.  She is also noted "random" episodes of chest tightness and lightheadedness.  She denies cough, sputum production, pleurodynia, calf tenderness.  She does report occasional ankle edema after being on her feet all day long.  Her evaluation to date has included a d-dimer which was normal and an EKG, also normal.  She is a physical therapist.  She has no significant occupational or environmental exposures.  There is no past history of asthma, allergies, atopy.  Her chest x-ray is reviewed below.  Past Medical History:  Diagnosis Date  . Abnormal cells of cervix 2016   Was told needs PAP every 6 months due to abnormal cells.   . Acquired deafness of right ear 2015  . ADHD     Past Surgical History:  Procedure Laterality Date  . nexplanon  2017  . wisdom teeth removal      MEDICATIONS: I have reviewed all medications and confirmed regimen as documented  Social History   Socioeconomic History  . Marital status: Married    Spouse name: jonathan  . Number of children: 0  . Years of education: Not on file  . Highest education level: Doctorate  Social Needs  . Financial resource strain: Not hard at all  . Food insecurity - worry: Never true  . Food insecurity - inability: Never true  . Transportation needs - medical: No  . Transportation needs - non-medical:  No  Occupational History    Comment: full time  Tobacco Use  . Smoking status: Never Smoker  . Smokeless tobacco: Never Used  Substance and Sexual Activity  . Alcohol use: Yes    Alcohol/week: 1.2 oz    Types: 1 Glasses of wine, 1 Cans of beer per week  . Drug use: No  . Sexual activity: Yes    Birth control/protection: Implant  Other Topics Concern  . Not on file  Social History Narrative  . Not on file    Family History  Problem Relation Age of Onset  . Hypertension Father   . Hyperthyroidism Father   . Lung cancer Maternal Grandmother   . Lung cancer Paternal Grandmother   . Lung cancer Paternal Grandfather     ROS: No fever, myalgias/arthralgias, unexplained weight loss or weight gain No new focal weakness or sensory deficits No otalgia, hearing loss, visual changes, nasal and sinus symptoms, mouth and throat problems No neck pain or adenopathy No abdominal pain, N/V/D, diarrhea, change in bowel pattern No dysuria, change in urinary pattern   Vitals:   06/24/17 0821 06/24/17 0826  BP:  110/68  Pulse:  80  SpO2:  99%  Weight: 59.4 kg (131 lb)   Height: 5\' 2"  (1.575 m)      EXAM:  Gen: WDWN, No overt respiratory distress HEENT: NCAT, sclera white, oropharynx normal Neck: Supple without LAN, thyromegaly, JVD Lungs: Breath sounds are full without wheezes or other adventitious  sounds Cardiovascular: RRR, no murmurs noted Abdomen: Soft, nontender, normal BS Ext: without clubbing, cyanosis, edema Neuro: CNs grossly intact, motor and sensory intact Skin: Limited exam, no lesions noted  DATA:   BMP Latest Ref Rng & Units 06/23/2017 06/04/2017 02/13/2017  Glucose 65 - 99 mg/dL 89 161(W105(H) 89  BUN 6 - 20 mg/dL 9 10 12   Creatinine 0.57 - 1.00 mg/dL 9.600.86 4.540.92 0.980.85  BUN/Creat Ratio 9 - 23 10 - 14  Sodium 134 - 144 mmol/L 141 136 141  Potassium 3.5 - 5.2 mmol/L 4.1 3.3(L) 3.8  Chloride 96 - 106 mmol/L 104 104 101  CO2 20 - 29 mmol/L 22 26 25   Calcium 8.7 - 10.2  mg/dL 9.2 9.3 9.9    CBC Latest Ref Rng & Units 06/04/2017 02/13/2017  WBC 3.6 - 11.0 K/uL 6.8 7.9  Hemoglobin 12.0 - 16.0 g/dL 11.914.3 14.714.3  Hematocrit 82.935.0 - 47.0 % 41.3 42.5  Platelets 150 - 440 K/uL 313 319    CXR 06/04/17: No acute cardiac or pulmonary findings  IMPRESSION:     ICD-10-CM   1. Dyspnea on exertion R06.09 Pulmonary Function Test [facility] Only   Her exertional dyspnea is rather acute onset after a upper respiratory infection.  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  A normal d-dimer virtually rules out pulmonary embolism.  The only other diagnoses to consider would be possible PSVT (noting that these episodes can also occur at rest).  PLAN:  Symbicort inhaler, 2 actuations twice a day.  Rinse mouth after use Albuterol inhaler to be used as needed for chest tightness or shortness of breath.    She may also use it prior to exercise  Follow-up in 3-4 weeks after pulmonary function tests have been performed   Billy Fischeravid Simonds, MD [facility] service Mobile [phone] Pager [phone] 06/24/2017 9:17 AM

## 2017-06-25 LAB — PAP IG W/ RFLX HPV ASCU: PAP Smear Comment: 0

## 2017-06-25 LAB — SPECIMEN STATUS REPORT

## 2017-06-30 ENCOUNTER — Ambulatory Visit: Payer: 59 | Attending: Pulmonary Disease

## 2017-06-30 DIAGNOSIS — R0609 Other forms of dyspnea: Secondary | ICD-10-CM | POA: Diagnosis not present

## 2017-06-30 DIAGNOSIS — R0600 Dyspnea, unspecified: Secondary | ICD-10-CM

## 2017-07-03 ENCOUNTER — Ambulatory Visit (INDEPENDENT_AMBULATORY_CARE_PROVIDER_SITE_OTHER): Payer: 59 | Admitting: Certified Nurse Midwife

## 2017-07-03 ENCOUNTER — Encounter: Payer: Self-pay | Admitting: Certified Nurse Midwife

## 2017-07-03 VITALS — BP 104/67 | HR 64 | Ht 62.0 in | Wt 130.3 lb

## 2017-07-03 DIAGNOSIS — Z30017 Encounter for initial prescription of implantable subdermal contraceptive: Secondary | ICD-10-CM | POA: Diagnosis not present

## 2017-07-03 DIAGNOSIS — Z3046 Encounter for surveillance of implantable subdermal contraceptive: Secondary | ICD-10-CM

## 2017-07-03 MED ORDER — ETONOGESTREL 68 MG ~~LOC~~ IMPL
68.0000 mg | DRUG_IMPLANT | Freq: Once | SUBCUTANEOUS | Status: AC
  Administered 2017-07-03: 68 mg via SUBCUTANEOUS

## 2017-07-03 NOTE — Progress Notes (Signed)
Susan Hoffman is a 27 y.o. year old [id number] Caucasian female here for Nexplanon removal and reinsertion.  She was given informed consent for removal and reinsertion of her Nexplanon. Her Nexplanon was placed 2016, Patient's last menstrual period was 05/26/2017 (approximate).   Risks/benefits/side effects of Nexplanon have been discussed and her questions have been answered.  Specifically, a failure rate of 04/998 has been reported, with an increased failure rate if pt takes St. John's Wort and/or antiseizure medicaitons.    Susan Hoffman is aware of the common side effect of irregular bleeding, which the incidence of decreases over time.  BP 104/67   Pulse 64   Ht 5\' 2"  (1.575 m)   Wt 130 lb 4.8 oz (59.1 kg)   LMP 05/26/2017 (Approximate)   BMI 23.83 kg/m  Patient's last menstrual period was 05/26/2017 (approximate). No results found for this or any previous visit (from the past 24 hour(s)).   Appropriate time out taken. Nexplanon site identified.  Area prepped in usual sterile fashon. Two cc's of 2% lidocaine was used to anesthetize the area. A small stab incision was made right beside the implant on the distal portion.  The Nexplanon rod was grasped using hemostats and removed intact without difficulty.  The area was cleansed again with betadine and the Nexplanon was inserted per manufacturer's recommendations without difficulty.  Steri-strips and a pressure bandage was applied.  There was less than 3 cc blood loss. There were no complications.  The patient tolerated the procedure well.  She was instructed to keep the area clean and dry, remove pressure bandage in 24 hours, and keep insertion site covered with the steri-strips for 3-5 days.  She was given a card indicating date Nexplanon was inserted and date it needs to be removed.   Reviewed red flag symptoms and when to call.   RTC as needed.    Gunnar BullaJenkins Michelle Lawhorn, CNM [facility], [facility]

## 2017-07-03 NOTE — Patient Instructions (Addendum)
Nexplanon Instructions After Insertion   Keep bandage clean and dry for 24 hours   May use ice/Tylenol/Ibuprofen for soreness or pain   If you develop fever, drainage or increased warmth from incision site-contact office immediately  Etonogestrel implant What is this medicine? ETONOGESTREL (et oh noe JES trel) is a contraceptive (birth control) device. It is used to prevent pregnancy. It can be used for up to 3 years. This medicine may be used for other purposes; ask your health care provider or pharmacist if you have questions. COMMON BRAND NAME(S): Implanon, Nexplanon What should I tell my health care provider before I take this medicine? They need to know if you have any of these conditions: -abnormal vaginal bleeding -blood vessel disease or blood clots -cancer of the breast, cervix, or liver -depression -diabetes -gallbladder disease -headaches -heart disease or recent heart attack -high blood pressure -high cholesterol -kidney disease -liver disease -renal disease -seizures -tobacco smoker -an unusual or allergic reaction to etonogestrel, other hormones, anesthetics or antiseptics, medicines, foods, dyes, or preservatives -pregnant or trying to get pregnant -breast-feeding How should I use this medicine? This device is inserted just under the skin on the inner side of your upper arm by a health care professional. Talk to your pediatrician regarding the use of this medicine in children. Special care may be needed. Overdosage: If you think you have taken too much of this medicine contact a poison control center or emergency room at once. NOTE: This medicine is only for you. Do not share this medicine with others. What if I miss a dose? This does not apply. What may interact with this medicine? Do not take this medicine with any of the following medications: -amprenavir -bosentan -fosamprenavir This medicine may also interact with the following  medications: -barbiturate medicines for inducing sleep or treating seizures -certain medicines for fungal infections like ketoconazole and itraconazole -grapefruit juice -griseofulvin -medicines to treat seizures like carbamazepine, felbamate, oxcarbazepine, phenytoin, topiramate -modafinil -phenylbutazone -rifampin -rufinamide -some medicines to treat HIV infection like atazanavir, indinavir, lopinavir, nelfinavir, tipranavir, ritonavir -St. John's wort This list may not describe all possible interactions. Give your health care provider a list of all the medicines, herbs, non-prescription drugs, or dietary supplements you use. Also tell them if you smoke, drink alcohol, or use illegal drugs. Some items may interact with your medicine. What should I watch for while using this medicine? This product does not protect you against HIV infection (AIDS) or other sexually transmitted diseases. You should be able to feel the implant by pressing your fingertips over the skin where it was inserted. Contact your doctor if you cannot feel the implant, and use a non-hormonal birth control method (such as condoms) until your doctor confirms that the implant is in place. If you feel that the implant may have broken or become bent while in your arm, contact your healthcare provider. What side effects may I notice from receiving this medicine? Side effects that you should report to your doctor or health care professional as soon as possible: -allergic reactions like skin rash, itching or hives, swelling of the face, lips, or tongue -breast lumps -changes in emotions or moods -depressed mood -heavy or prolonged menstrual bleeding -pain, irritation, swelling, or bruising at the insertion site -scar at site of insertion -signs of infection at the insertion site such as fever, and skin redness, pain or discharge -signs of pregnancy -signs and symptoms of a blood clot such as breathing problems; changes in  vision; chest pain; severe,   sudden headache; pain, swelling, warmth in the leg; trouble speaking; sudden numbness or weakness of the face, arm or leg -signs and symptoms of liver injury like dark yellow or brown urine; general ill feeling or flu-like symptoms; light-colored stools; loss of appetite; nausea; right upper belly pain; unusually weak or tired; yellowing of the eyes or skin -unusual vaginal bleeding, discharge -signs and symptoms of a stroke like changes in vision; confusion; trouble speaking or understanding; severe headaches; sudden numbness or weakness of the face, arm or leg; trouble walking; dizziness; loss of balance or coordination Side effects that usually do not require medical attention (report to your doctor or health care professional if they continue or are bothersome): -acne -back pain -breast pain -changes in weight -dizziness -general ill feeling or flu-like symptoms -headache -irregular menstrual bleeding -nausea -sore throat -vaginal irritation or inflammation This list may not describe all possible side effects. Call your doctor for medical advice about side effects. You may report side effects to FDA at 1-800-FDA-1088. Where should I keep my medicine? This drug is given in a hospital or clinic and will not be stored at home. NOTE: This sheet is a summary. It may not cover all possible information. If you have questions about this medicine, talk to your doctor, pharmacist, or health care provider.  2018 Elsevier/Gold Standard (2015-11-01 11:19:22)  

## 2017-07-24 ENCOUNTER — Encounter: Payer: Self-pay | Admitting: Pulmonary Disease

## 2017-07-24 ENCOUNTER — Ambulatory Visit (INDEPENDENT_AMBULATORY_CARE_PROVIDER_SITE_OTHER): Payer: 59 | Admitting: Pulmonary Disease

## 2017-07-24 VITALS — BP 110/72 | HR 88 | Ht 62.0 in | Wt 132.0 lb

## 2017-07-24 DIAGNOSIS — J45990 Exercise induced bronchospasm: Secondary | ICD-10-CM | POA: Diagnosis not present

## 2017-07-24 DIAGNOSIS — J4530 Mild persistent asthma, uncomplicated: Secondary | ICD-10-CM | POA: Diagnosis not present

## 2017-07-24 MED ORDER — MONTELUKAST SODIUM 10 MG PO TABS: 10.0000 mg | ORAL_TABLET | Freq: Every day | ORAL | 10 refills | Status: DC

## 2017-07-24 NOTE — Progress Notes (Signed)
PULMONARY OFFICE FOLLOW UP NOTE  Requesting MD/Service: Everlene OtherJayce Cook, MD Date of initial consultation: 06/24/17 Reason for consultation: Dyspnea  PT PROFILE: 27 y.o. female never smoker, very athletic woman referred for evaluation of 4-5 weeks of exertional dyspnea  DATA: 06/30/17 PFTs: FVC:  4.29 L (128 %pred), FEV1:  3.07 L (106 %pred), FEV1/FVC: 72%, TLC: 5.84 L (123 %pred), DLCO 89 %pred    SUBJ:  Routine re-eval. Reports that she is "75% better". Does not believe that Symbicort has helped much but notices improvement with albuterol during times of exercise (plays competitive soccer). No new complaints. Denies CP, fever, purulent sputum, hemoptysis, LE edema and calf tenderness    Vitals:   07/24/17 1053 07/24/17 1057  BP:  110/72  Pulse:  88  SpO2:  97%  Weight: 132 lb (59.9 kg)   Height: 5\' 2"  (1.575 m)   RA   EXAM:  Gen: NAD HEENT: WNL Lungs: BS full without wheezes or other adventitious sounds Cardiovascular: Reg, no M Abdomen: Soft, nontender, normal BS Ext: no C/C/E Neuro: grossly intact  DATA:   BMP Latest Ref Rng & Units 06/23/2017 06/04/2017 02/13/2017  Glucose 65 - 99 mg/dL 89 960(A105(H) 89  BUN 6 - 20 mg/dL 9 10 12   Creatinine 0.57 - 1.00 mg/dL 5.400.86 9.810.92 1.910.85  BUN/Creat Ratio 9 - 23 10 - 14  Sodium 134 - 144 mmol/L 141 136 141  Potassium 3.5 - 5.2 mmol/L 4.1 3.3(L) 3.8  Chloride 96 - 106 mmol/L 104 104 101  CO2 20 - 29 mmol/L 22 26 25   Calcium 8.7 - 10.2 mg/dL 9.2 9.3 9.9    CBC Latest Ref Rng & Units 06/04/2017 02/13/2017  WBC 3.6 - 11.0 K/uL 6.8 7.9  Hemoglobin 12.0 - 16.0 g/dL 47.814.3 29.514.3  Hematocrit 62.135.0 - 47.0 % 41.3 42.5  Platelets 150 - 440 K/uL 313 319    CXR: NNF  IMPRESSION:     ICD-10-CM   1. Mild persistent asthma without complication J45.30   2. 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

## 2017-07-24 NOTE — Patient Instructions (Signed)
Continue Symbicort for now Add Singulair (montelukast) 10 mg daily taken at bedtime Continue albuterol rescue inhaler as you are currently using it In 3-4 weeks, you may try discontinuation of Symbicort noting its effect on your respiratory symptoms Follow-up in 3 months.  You may call in the interim with any questions or updates.

## 2017-08-07 ENCOUNTER — Ambulatory Visit: Payer: 59 | Admitting: Psychiatry

## 2017-08-31 ENCOUNTER — Encounter: Payer: Self-pay | Admitting: Family Medicine

## 2017-08-31 ENCOUNTER — Ambulatory Visit: Payer: Self-pay | Admitting: Family Medicine

## 2017-08-31 VITALS — BP 110/62 | HR 74 | Temp 98.4°F | Wt 133.0 lb

## 2017-08-31 DIAGNOSIS — J309 Allergic rhinitis, unspecified: Secondary | ICD-10-CM

## 2017-08-31 DIAGNOSIS — J029 Acute pharyngitis, unspecified: Secondary | ICD-10-CM

## 2017-08-31 LAB — POCT RAPID STREP A (OFFICE): Rapid Strep A Screen: NEGATIVE

## 2017-08-31 MED ORDER — IPRATROPIUM BROMIDE 0.03 % NA SOLN: 2.0000 | Freq: Two times a day (BID) | NASAL | 0 refills | Status: DC

## 2017-08-31 MED ORDER — LEVOCETIRIZINE DIHYDROCHLORIDE 5 MG PO TABS: 5.0000 mg | ORAL_TABLET | Freq: Every evening | ORAL | 0 refills | Status: DC

## 2017-08-31 MED ORDER — IBUPROFEN 800 MG PO TABS: 800.0000 mg | ORAL_TABLET | Freq: Two times a day (BID) | ORAL | 0 refills | Status: DC | PRN

## 2017-08-31 NOTE — Progress Notes (Signed)
Subjective:  Susan Hoffman is a 27 y.o. female who presents for evaluation of sore throat and rhinorrhea. Symptoms include a one day history of hoarseness. Symptoms has been gradually worsening since that time.  Treatment to date:  oral throat pain relief which temporarily improved symptoms.  High risk factors for influenza complications:  none.  The following portions of the patient's history were reviewed and updated as appropriate:  allergies, current medications and past medical history.  Pertinent items are noted in HPI. Objective:  Physical Exam  Constitutional: She is well-developed, well-nourished, and in no distress.  HENT:  Head: Normocephalic and atraumatic.  Right Ear: External ear normal.  Nose: Mucosal edema and rhinorrhea present. Right sinus exhibits no maxillary sinus tenderness and no frontal sinus tenderness. Left sinus exhibits no maxillary sinus tenderness and no frontal sinus tenderness.  Mouth/Throat: Uvula is midline. Posterior oropharyngeal erythema present. No oropharyngeal exudate or posterior oropharyngeal edema.  hoariness of voice noted during exam   Cardiovascular: Normal rate, regular rhythm, normal heart sounds and intact distal pulses.  Pulmonary/Chest: Effort normal and breath sounds normal. She has no wheezes.  Vitals reviewed.  Assessment and Plan:  1. Sore throat 2. Allergic rhinitis, unspecified seasonality, unspecified trigger  Current episode is acute and symptoms present for approximately 24 hours. Treating symptomatically. Recommended one day of voice-rest in order to improve/resolve hoarseness symptoms. Discussed the importance of avoiding unnecessary antibiotic therapy. See medication orders:  Meds ordered this encounter  Medications  . levocetirizine (XYZAL) 5 MG tablet    Sig: Take 1 tablet (5 mg total) by mouth every evening.    Dispense:  60 tablet    Refill:  0  . ipratropium (ATROVENT) 0.03 % nasal spray    Sig: Place 2 sprays  into both nostrils 2 (two) times daily.    Dispense:  30 mL    Refill:  0  . ibuprofen (ADVIL,MOTRIN) 800 MG tablet    Sig: Take 1 tablet (800 mg total) by mouth every 12 (twelve) hours as needed (throat pain).    Dispense:  30 tablet    Refill:  0     -The patient was given clear instructions to go to ER or return to medical center if symptoms do not improve, worsen or new problems develop. The patient verbalized understanding.   Godfrey Pick. Tiburcio Pea, MSN, FNP-C [facility] [address]   [place], [place] [address] [phone]

## 2017-08-31 NOTE — Patient Instructions (Signed)
Allergic Rhinitis, Adult Allergic rhinitis is an allergic reaction that affects the mucous membrane inside the nose. It causes sneezing, a runny or stuffy nose, and the feeling of mucus going down the back of the throat (postnasal drip). Allergic rhinitis can be mild to severe. There are two types of allergic rhinitis:  Seasonal. This type is also called hay fever. It happens only during certain seasons.  Perennial. This type can happen at any time of the year.  What are the causes? This condition happens when the body's defense system (immune system) responds to certain harmless substances called allergens as though they were germs.  Seasonal allergic rhinitis is triggered by pollen, which can come from grasses, trees, and weeds. Perennial allergic rhinitis may be caused by:  House dust mites.  Pet dander.  Mold spores.  What are the signs or symptoms? Symptoms of this condition include:  Sneezing.  Runny or stuffy nose (nasal congestion).  Postnasal drip.  Itchy nose.  Tearing of the eyes.  Trouble sleeping.  Daytime sleepiness.  How is this diagnosed? This condition may be diagnosed based on:  Your medical history.  A physical exam.  Tests to check for related conditions, such as: ? Asthma. ? Pink eye. ? Ear infection. ? Upper respiratory infection.  Tests to find out which allergens trigger your symptoms. These may include skin or blood tests.  How is this treated? There is no cure for this condition, but treatment can help control symptoms. Treatment may include:  Taking medicines that block allergy symptoms, such as antihistamines. Medicine may be given as a shot, nasal spray, or pill.  Avoiding the allergen.  Desensitization. This treatment involves getting ongoing shots until your body becomes less sensitive to the allergen. This treatment may be done if other treatments do not help.  If taking medicine and avoiding the allergen does not work,  new, stronger medicines may be prescribed.  Follow these instructions at home:  Find out what you are allergic to. Common allergens include smoke, dust, and pollen.  Avoid the things you are allergic to. These are some things you can do to help avoid allergens: ? Replace carpet with wood, tile, or vinyl flooring. Carpet can trap dander and dust. ? Do not smoke. Do not allow smoking in your home. ? Change your heating and air conditioning filter at least once a month. ? During allergy season:  Keep windows closed as much as possible.  Plan outdoor activities when pollen counts are lowest. This is usually during the evening hours.  When coming indoors, change clothing and shower before sitting on furniture or bedding.  Take over-the-counter and prescription medicines only as told by your health care provider.  Keep all follow-up visits as told by your health care provider. This is important. Contact a health care provider if:  You have a fever.  You develop a persistent cough.  You make whistling sounds when you breathe (you wheeze).  Your symptoms interfere with your normal daily activities. Get help right away if:  You have shortness of breath. Summary  This condition can be managed by taking medicines as directed and avoiding allergens.  Contact your health care provider if you develop a persistent cough or fever.  During allergy season, keep windows closed as much as possible. This information is not intended to replace advice given to you by your health care provider. Make sure you discuss any questions you have with your health care provider. Document Released: 01/07/2001 Document Revised:  05/22/2016 Document Reviewed: 05/22/2016 Elsevier Interactive Patient Education  2018 Elsevier Inc.  Sore Throat When you have a sore throat, your throat may:  Hurt.  Burn.  Feel irritated.  Feel scratchy.  Many things can cause a sore throat, including:  An  infection.  Allergies.  Dryness in the air.  Smoke or pollution.  Gastroesophageal reflux disease (GERD).  A tumor.  A sore throat can be the first sign of another sickness. It can happen with other problems, like coughing or a fever. Most sore throats go away without treatment. Follow these instructions at home:  Take over-the-counter medicines only as told by your doctor.  Drink enough fluids to keep your pee (urine) clear or pale yellow.  Rest when you feel you need to.  To help with pain, try: ? Sipping warm liquids, such as broth, herbal tea, or warm water. ? Eating or drinking cold or frozen liquids, such as frozen ice pops. ? Gargling with a salt-water mixture 3-4 times a day or as needed. To make a salt-water mixture, add -1 tsp of salt in 1 cup of warm water. Mix it until you cannot see the salt anymore. ? Sucking on hard candy or throat lozenges. ? Putting a cool-mist humidifier in your bedroom at night. ? Sitting in the bathroom with the door closed for 5-10 minutes while you run hot water in the shower.  Do not use any tobacco products, such as cigarettes, chewing tobacco, and e-cigarettes. If you need help quitting, ask your doctor. Contact a doctor if:  You have a fever for more than 2-3 days.  You keep having symptoms for more than 2-3 days.  Your throat does not get better in 7 days.  You have a fever and your symptoms suddenly get worse. Get help right away if:  You have trouble breathing.  You cannot swallow fluids, soft foods, or your saliva.  You have swelling in your throat or neck that gets worse.  You keep feeling like you are going to throw up (vomit).  You keep throwing up. This information is not intended to replace advice given to you by your health care provider. Make sure you discuss any questions you have with your health care provider. Document Released: 01/22/2008 Document Revised: 12/09/2015 Document Reviewed: 02/02/2015 Elsevier  Interactive Patient Education  [employer].

## 2017-09-03 ENCOUNTER — Telehealth: Payer: Self-pay | Admitting: Emergency Medicine

## 2017-09-03 NOTE — Telephone Encounter (Signed)
Left message follow up call from [facility] visit 

## 2017-09-04 ENCOUNTER — Ambulatory Visit: Payer: 59 | Admitting: Psychiatry

## 2017-09-07 ENCOUNTER — Telehealth: Payer: 59 | Admitting: Family Medicine

## 2017-09-07 DIAGNOSIS — J40 Bronchitis, not specified as acute or chronic: Secondary | ICD-10-CM

## 2017-09-07 MED ORDER — PREDNISONE 20 MG PO TABS: 40.0000 mg | ORAL_TABLET | Freq: Every day | ORAL | 0 refills | Status: AC

## 2017-09-07 NOTE — Progress Notes (Signed)

## 2017-09-24 ENCOUNTER — Encounter: Payer: Self-pay | Admitting: Psychiatry

## 2017-09-24 ENCOUNTER — Other Ambulatory Visit: Payer: Self-pay

## 2017-09-24 ENCOUNTER — Ambulatory Visit (INDEPENDENT_AMBULATORY_CARE_PROVIDER_SITE_OTHER): Payer: 59 | Admitting: Psychiatry

## 2017-09-24 VITALS — BP 105/72 | HR 88 | Temp 97.9°F | Wt 135.2 lb

## 2017-09-24 DIAGNOSIS — F901 Attention-deficit hyperactivity disorder, predominantly hyperactive type: Secondary | ICD-10-CM | POA: Diagnosis not present

## 2017-09-24 MED ORDER — AMPHETAMINE-DEXTROAMPHETAMINE 10 MG PO TABS: 10.0000 mg | ORAL_TABLET | Freq: Two times a day (BID) | ORAL | 0 refills | Status: DC

## 2017-09-24 NOTE — Progress Notes (Signed)
[facility] MD OP Progress Note  09/24/2017 9:51 AM Susan Hoffman  MRN:  [id number]  Chief Complaint: ' I am here for follow up." Chief Complaint    Follow-up; Medication Refill     HPI: Susan Hoffman is a 27 year old Caucasian female, employed, married, lives in [place], presented to the clinic today for a follow-up visit.  Patient today reports she is currently doing well on the Adderall.  She reports she had bronchiolitis last month and hence did not take the medication for a while.  She reports hence she had to reschedule her appointment.  She reports she does not have any side effects to the medications.  She denies any appetite changes, anxiety symptoms, sleep issues and so on.  Patient denies any suicidality.  Patient denies any perceptual disturbances.  Patient continues to stay away from drugs and alcohol.  Patient reports she has been trying to exercise and to lose some weight.  She denies any new concerns.   Visit Diagnosis:    ICD-10-CM   1. 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  I have reviewed past psychiatric history from my progress note on 05/08/2017.  Past Medical History:  Past Medical History:  Diagnosis Date  . Abnormal cells of cervix 2016   Was told needs PAP every 6 months due to abnormal cells.   . Acquired deafness of right ear 2015  . ADHD     Past Surgical History:  Procedure Laterality Date  . nexplanon  2017  . TYMPANOPLASTY    . wisdom teeth removal      Family Psychiatric History: Reviewed family psychiatric history from my progress note on 05/08/2017.  Family History:  Family History  Problem Relation Age of Onset  . Hypertension Father   . Hyperthyroidism Father   . Lung cancer Maternal Grandmother   . Lung cancer Paternal Grandmother   . Lung cancer Paternal Grandfather   . Cancer  Maternal Aunt   . Ovarian cancer Neg Hx   . AAA (abdominal aortic aneurysm) Neg Hx   . Colon cancer Neg Hx   . Diabetes Neg Hx    Substance abuse history: Denies  Social History: She lives with her husband in [place].  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  She denies having children. Social History   Socioeconomic History  . Marital status: Married    Spouse name: jonathan  . Number of children: 0  . Years of education: Not on file  . Highest education level: Doctorate  Occupational History    Comment: full time  Social Needs  . Financial resource strain: Not hard at all  . Food insecurity:    Worry: Never true    Inability: Never true  . Transportation needs:    Medical: No    Non-medical: No  Tobacco Use  . Smoking status: Never Smoker  . Smokeless tobacco: Never Used  Substance and Sexual Activity  . Alcohol use: Yes    Alcohol/week: 1.2 oz    Types: 1 Glasses of wine, 1 Cans of beer per week    Comment: wine weekly  . Drug use: No  . Sexual activity: Yes    Birth control/protection: Implant  Lifestyle  . Physical activity:    Days per week: 4 days    Minutes per session: 60 min  . Stress: Only a little  Relationships  . Social connections:  Talks on phone: More than three times a week    Gets together: Three times a week    Attends religious service: Never    Active member of club or organization: Yes    Attends meetings of clubs or organizations: More than 4 times per year    Relationship status: Married  Other Topics Concern  . Not on file  Social History Narrative  . Not on file    Allergies:  Allergies  Allergen Reactions  . Codeine     Metabolic Disorder Labs: No results found for: HGBA1C, MPG No results found for: PROLACTIN Lab Results  Component Value Date   CHOL 158 06/23/2017   TRIG 61 06/23/2017   HDL 70 06/23/2017   CHOLHDL 2.3 06/23/2017   LDLCALC 76 06/23/2017   Lab Results  Component Value Date   TSH 0.904  02/13/2017    Therapeutic Level Labs: No results found for: LITHIUM No results found for: VALPROATE No components found for:  CBMZ  Current Medications: Current Outpatient Medications  Medication Sig Dispense Refill  . amphetamine-dextroamphetamine (ADDERALL) 10 MG tablet Take 1 tablet (10 mg total) by mouth 2 (two) times daily. 60 tablet 0  . SF 1.1 % GEL dental gel   2  . [START ON 10/24/2017] amphetamine-dextroamphetamine (ADDERALL) 10 MG tablet Take 1 tablet (10 mg total) by mouth 2 (two) times daily. 60 tablet 0  . [START ON 11/22/2017] amphetamine-dextroamphetamine (ADDERALL) 10 MG tablet Take 1 tablet (10 mg total) by mouth 2 (two) times daily. 60 tablet 0   No current facility-administered medications for this visit.      Musculoskeletal: Strength & Muscle Tone: within normal limits Gait & Station: normal Patient leans: N/A  Psychiatric Specialty Exam: Review of Systems  Psychiatric/Behavioral: Negative for depression, hallucinations, substance abuse and suicidal ideas. The patient is not nervous/anxious.   All other systems reviewed and are negative.   Blood pressure 105/72, pulse 88, temperature 97.9 F (36.6 C), temperature source Oral, weight 135 lb 3.2 oz (61.3 kg).Body mass index is 24.73 kg/m.  General Appearance: Casual  Eye Contact:  Fair  Speech:  Clear and Coherent  Volume:  Normal  Mood:  Euthymic  Affect:  Congruent  Thought Process:  Goal Directed and Descriptions of Associations: Intact  Orientation:  Full (Time, Place, and Person)  Thought Content: Logical   Suicidal Thoughts:  No  Homicidal Thoughts:  No  Memory:  Immediate;   Fair Recent;   Fair Remote;   Fair  Judgement:  Fair  Insight:  Fair  Psychomotor Activity:  Normal  Concentration:  Concentration: Fair and Attention Span: Fair  Recall:  [employer] of Knowledge: Fair  Language: Fair  Akathisia:  No  Handed:  Right  AIMS (if indicated): na  Assets:  Communication Skills Desire  for Improvement Social Support Talents/Skills Transportation  ADL's:  Intact  Cognition: WNL  Sleep:  Fair   Screenings: PHQ2-9     Office Visit from 02/13/2017 in [facility]  PHQ-2 Total Score  0       Assessment and Plan: [name] is a 27 year old Caucasian female, employed, married, history of ADHD, presented to the clinic today for a follow-up visit.  Patient reports she is currently doing well on the Adderall.  She continues to deny any side effects.  Continue plan as noted below.  Plan  ADHD hyperactive type Continue Adderall 10 mg p.o. twice daily.  Provided 3 prescription with date specified.  The last one  to be filled on 11/22/2017. Reviewed [place] controlled substance database.  Reviewed vital signs-within normal limits.  Follow-up in clinic in 3 months or sooner if needed.  More than 50 % of the time was spent for psychoeducation and supportive psychotherapy and care coordination.  This note was generated in part or whole with voice recognition software. Voice recognition is usually quite accurate but there are transcription errors that can and very often do occur. I apologize for any typographical errors that were not detected and corrected.         Jomarie Longs, MD 09/25/2017, 9:11 AM

## 2017-12-11 ENCOUNTER — Ambulatory Visit: Payer: Self-pay | Admitting: Family Medicine

## 2017-12-11 ENCOUNTER — Encounter: Payer: Self-pay | Admitting: Family Medicine

## 2017-12-11 ENCOUNTER — Encounter: Payer: Self-pay | Admitting: Internal Medicine

## 2017-12-11 ENCOUNTER — Telehealth: Payer: 59 | Admitting: Nurse Practitioner

## 2017-12-11 DIAGNOSIS — L989 Disorder of the skin and subcutaneous tissue, unspecified: Secondary | ICD-10-CM

## 2017-12-11 NOTE — Progress Notes (Signed)
Thanks for picture. Not 100% sure what that is, but it is not something I can treat in an e visit.  Based on what you shared with me it looks like you have a serious condition that should be evaluated in a face to face office visit.  NOTE: If you entered your credit card information for this eVisit, you will not be charged. You may see a "hold" on your card for the $30 but that hold will drop off and you will not have a charge processed.  If you are having a true medical emergency please call 911.  If you need an urgent face to face visit, [facility] has four urgent care centers for your convenience.  If you need care fast and have a high deductible or no insurance consider:   [email or web address] to reserve your spot online an avoid wait times  [facility] [address], [address] [place], [place] [address] 8 am to 8 pm Monday-Friday 10 am to 4 pm Saturday-Sunday *Across the street from [employer]  [facility]  [address] [place], [id number] 8 am to 5 pm Monday-Friday * In the [facility] on the [facility]   The following sites will take your  insurance:  . [facility]  [phone] Get Driving Directions Find a Provider at this Location  [address] [place], [place] [address] . 10 am to 8 pm Monday-Friday . 12 pm to 8 pm Saturday-Sunday   . [facility]  [phone] Get Driving Directions Find a Provider at this Location  [address] [place], [place] [address] . 8 am to 8 pm Monday-Friday . 9 am to 6 pm Saturday . 11 am to 6 pm Sunday   . [facility]  [phone] Get Driving Directions  [address] [place], [place] [address] . 8 am to 8 pm Monday-Friday . 8 am to 4 pm Saturday-Sunday   Your e-visit answers were reviewed by a board certified advanced clinical practitioner to complete your  personal care plan.  Thank you for using e-Visits.

## 2017-12-11 NOTE — Progress Notes (Signed)
Patient referred back to PCP for evaluation of chronic foot issue suspected to be plantar wart present for over 1 year and with acute onset pain x 1 day.  Referred back to PCP for referral to podiatry.

## 2017-12-24 ENCOUNTER — Other Ambulatory Visit: Payer: Self-pay

## 2017-12-24 ENCOUNTER — Ambulatory Visit (INDEPENDENT_AMBULATORY_CARE_PROVIDER_SITE_OTHER): Payer: 59 | Admitting: Psychiatry

## 2017-12-24 ENCOUNTER — Encounter: Payer: Self-pay | Admitting: Psychiatry

## 2017-12-24 VITALS — BP 102/67 | HR 73 | Temp 98.8°F | Wt 139.2 lb

## 2017-12-24 DIAGNOSIS — F901 Attention-deficit hyperactivity disorder, predominantly hyperactive type: Secondary | ICD-10-CM | POA: Diagnosis not present

## 2017-12-24 MED ORDER — AMPHETAMINE-DEXTROAMPHETAMINE 10 MG PO TABS: 10.0000 mg | ORAL_TABLET | Freq: Two times a day (BID) | ORAL | 0 refills | Status: DC

## 2017-12-24 NOTE — Progress Notes (Signed)
BH MD OP Progress Note  12/24/2017 9:14 AM Susan Hoffman  MRN:  [id number]  Chief Complaint:  ' I am here for follow up." Chief Complaint    Follow-up; Medication Refill     HPI: Susan Hoffman is a 27 year old Caucasian female, employed, married, lives in [place], presented to the clinic today for a follow-up visit.  Patient today reports she is doing well on her medications.  She reports her focus and attention as good.  She reports work is going well.  She reports she continues to exercise on a regular basis.  She denies any side effects to the medications.  She reports sleep is good.  Patient denies any suicidality.  Patient continues to have good social support from [facility].  Patient continues to stay away from illicit drugs and alcohol. Visit Diagnosis:    ICD-10-CM   1. 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  I have reviewed psychiatric history from my progress note on 05/08/2017  Past Medical History:  Past Medical History:  Diagnosis Date  . Abnormal cells of cervix 2016   Was told needs PAP every 6 months due to abnormal cells.   . Acquired deafness of right ear 2015  . ADHD     Past Surgical History:  Procedure Laterality Date  . nexplanon  2017  . TYMPANOPLASTY    . wisdom teeth removal      Family Psychiatric History: Reviewed family psychiatric history from my progress note on 05/08/2017.  Family History:  Family History  Problem Relation Age of Onset  . Hypertension Father   . Hyperthyroidism Father   . Lung cancer Maternal Grandmother   . Lung cancer Paternal Grandmother   . Lung cancer Paternal Grandfather   . Cancer Maternal Aunt   . Ovarian cancer Neg Hx   . AAA (abdominal aortic  aneurysm) Neg Hx   . Colon cancer Neg Hx   . Diabetes Neg Hx     Social History: Reviewed social history from my progress note on 05/08/2017. Social History   Socioeconomic History  . Marital status: Married    Spouse name: jonathan  . Number of children: 0  . Years of education: Not on file  . Highest education level: Doctorate  Occupational History    Comment: full time  Social Needs  . Financial resource strain: Not hard at all  . Food insecurity:    Worry: Never true    Inability: Never true  . Transportation needs:    Medical: No    Non-medical: No  Tobacco Use  . Smoking status: Never Smoker  . Smokeless tobacco: Never Used  Substance and Sexual Activity  . Alcohol use: Yes    Alcohol/week: 2.0 standard drinks    Types: 1 Glasses of wine, 1 Cans of beer per week    Comment: wine weekly  . Drug use: No  . Sexual activity: Yes    Birth control/protection: Implant  Lifestyle  . Physical activity:    Days per week: 4 days    Minutes per session: 60 min  . Stress: Only a little  Relationships  . Social connections:    Talks on phone: More than three times a week    Gets together: Three times a week    Attends religious service: Never  Active member of club or organization: Yes    Attends meetings of clubs or organizations: More than 4 times per year    Relationship status: Married  Other Topics Concern  . Not on file  Social History Narrative  . Not on file    Allergies:  Allergies  Allergen Reactions  . Codeine   . Mushroom Extract Complex     Metabolic Disorder Labs: No results found for: HGBA1C, MPG No results found for: PROLACTIN Lab Results  Component Value Date   CHOL 158 06/23/2017   TRIG 61 06/23/2017   HDL 70 06/23/2017   CHOLHDL 2.3 06/23/2017   LDLCALC 76 06/23/2017   Lab Results  Component Value Date   TSH 0.904 02/13/2017    Therapeutic Level Labs: No results found for: LITHIUM No results found for: VALPROATE No components  found for:  CBMZ  Current Medications: Current Outpatient Medications  Medication Sig Dispense Refill  . amphetamine-dextroamphetamine (ADDERALL) 10 MG tablet Take 1 tablet (10 mg total) by mouth 2 (two) times daily. 60 tablet 0  . SF 1.1 % GEL dental gel   2  . [START ON 01/22/2018] amphetamine-dextroamphetamine (ADDERALL) 10 MG tablet Take 1 tablet (10 mg total) by mouth 2 (two) times daily. 60 tablet 0  . [START ON 02/20/2018] amphetamine-dextroamphetamine (ADDERALL) 10 MG tablet Take 1 tablet (10 mg total) by mouth 2 (two) times daily. 60 tablet 0   No current facility-administered medications for this visit.      Musculoskeletal: Strength & Muscle Tone: within normal limits Gait & Station: normal Patient leans: N/A  Psychiatric Specialty Exam: Review of Systems  Psychiatric/Behavioral: Negative for depression. The patient is not nervous/anxious.   All other systems reviewed and are negative.   Blood pressure 102/67, pulse 73, temperature 98.8 F (37.1 C), temperature source Oral, weight 139 lb 3.2 oz (63.1 kg).Body mass index is 25.46 kg/m.  General Appearance: Casual  Eye Contact:  Fair  Speech:  Clear and Coherent  Volume:  Normal  Mood:  Euthymic  Affect:  Congruent  Thought Process:  Goal Directed and Descriptions of Associations: Intact  Orientation:  Full (Time, Place, and Person)  Thought Content: Logical   Suicidal Thoughts:  No  Homicidal Thoughts:  No  Memory:  Immediate;   Fair Recent;   Fair Remote;   Fair  Judgement:  Fair  Insight:  Fair  Psychomotor Activity:  Normal  Concentration:  Concentration: Fair and Attention Span: Fair  Recall:  [employer]Fair  Fund of Knowledge: Fair  Language: Fair  Akathisia:  No  Handed:  Right  AIMS (if indicated): na  Assets:  Communication Skills Desire for Improvement Financial Resources/Insurance Housing Social Support  ADL's:  Intact  Cognition: WNL  Sleep:  Fair   Screenings: PHQ2-9     [facility]  PHQ-2 Total Score  0       Assessment and Plan: Susan Hoffman is a 27 year old Caucasian female, employed, married, history of ADHD, presented to the clinic today for a follow-up visit.  Patient continues to do well on her medication regimen.  She continues to tolerate it well.  Continue plan as noted below.  Plan PhD Continue Adderall 10 mg p.o. twice daily.  I have provided 3 prescriptions with date specified.  The last one to be filled on or after 02/20/2018. I have reviewed [place] controlled substance database.  I have reviewed her vital signs today are within normal limits.  Follow-up in clinic  in 3 months or sooner if needed.  More than 50 % of the time was spent for psychoeducation and supportive psychotherapy and care coordination.  This note was generated in part or whole with voice recognition software. Voice recognition is usually quite accurate but there are transcription errors that can and very often do occur. I apologize for any typographical errors that were not detected and corrected.        Jomarie Longs, MD 12/24/2017, 9:14 AM

## 2018-03-15 ENCOUNTER — Ambulatory Visit: Payer: 59 | Admitting: Psychiatry

## 2018-04-02 ENCOUNTER — Ambulatory Visit (INDEPENDENT_AMBULATORY_CARE_PROVIDER_SITE_OTHER): Payer: 59 | Admitting: Psychiatry

## 2018-04-02 ENCOUNTER — Encounter: Payer: Self-pay | Admitting: Psychiatry

## 2018-04-02 VITALS — BP 131/80 | HR 60 | Ht 62.0 in | Wt 138.0 lb

## 2018-04-02 DIAGNOSIS — F901 Attention-deficit hyperactivity disorder, predominantly hyperactive type: Secondary | ICD-10-CM | POA: Diagnosis not present

## 2018-04-02 MED ORDER — AMPHETAMINE-DEXTROAMPHETAMINE 10 MG PO TABS: 10.0000 mg | ORAL_TABLET | Freq: Two times a day (BID) | ORAL | 0 refills | Status: DC

## 2018-04-02 NOTE — Progress Notes (Signed)
BH MD OP Progress Note  04/02/2018 12:12 PM Susan Hoffman  MRN:  [id number]  Chief Complaint: ' I am here for follow up." Chief Complaint    Follow-up     HPI: Susan Hoffman is a 27 year old Caucasian female, employed, married, lives in [place], presented to the clinic today for a follow-up visit.  Patient today reports she is compliant with her Adderall.  She denies any appetite suppression, sleep problems, anxiety symptoms.  She reports she is able to focus at work.  She continues to exercise on a regular basis.  She denies any suicidality, homicidality.  Patient denies abusing any drugs or alcohol at this time.  She looks forward to Christmas holidays and plans to spend some time with her family.    Visit Diagnosis:    ICD-10-CM   1. 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  I have reviewed psychiatric history from my progress note on 05/08/2017  Past Medical History:  Past Medical History:  Diagnosis Date  . Abnormal cells of cervix 2016   Was told needs PAP every 6 months due to abnormal cells.   . Acquired deafness of right ear 2015  . ADHD     Past Surgical History:  Procedure Laterality Date  . nexplanon  2017  . TYMPANOPLASTY    . wisdom teeth removal      Family Psychiatric History: Reviewed family psychiatric history from my progress note on 05/08/2017.  Family History:  Family History  Problem Relation Age of Onset  . Hypertension Father   . Hyperthyroidism Father   . Lung cancer Maternal Grandmother   . Lung cancer Paternal Grandmother   . Lung cancer Paternal Grandfather   . Cancer Maternal Aunt   . Ovarian cancer Neg Hx   . AAA (abdominal aortic aneurysm) Neg Hx   . Colon cancer  Neg Hx   . Diabetes Neg Hx     Social History: Reviewed social history from my progress note on 05/08/2017 Social History   Socioeconomic History  . Marital status: Married    Spouse name: jonathan  . Number of children: 0  . Years of education: Not on file  . Highest education level: Doctorate  Occupational History    Comment: full time  Social Needs  . Financial resource strain: Not hard at all  . Food insecurity:    Worry: Never true    Inability: Never true  . Transportation needs:    Medical: No    Non-medical: No  Tobacco Use  . Smoking status: Never Smoker  . Smokeless tobacco: Never Used  Substance and Sexual Activity  . Alcohol use: Yes    Alcohol/week: 2.0 standard drinks    Types: 1 Glasses of wine, 1 Cans of beer per week    Comment: wine weekly  . Drug use: No  . Sexual activity: Yes    Birth control/protection: Implant  Lifestyle  . Physical activity:    Days per week: 4 days    Minutes per session: 60 min  . Stress: Only a little  Relationships  . Social connections:    Talks on phone: More than three times a week    Gets together: Three times a week    Attends religious service: Never    Active member of club or organization:  Yes    Attends meetings of clubs or organizations: More than 4 times per year    Relationship status: Married  Other Topics Concern  . Not on file  Social History Narrative  . Not on file    Allergies:  Allergies  Allergen Reactions  . Codeine   . Mushroom Extract Complex     Metabolic Disorder Labs: No results found for: HGBA1C, MPG No results found for: PROLACTIN Lab Results  Component Value Date   CHOL 158 06/23/2017   TRIG 61 06/23/2017   HDL 70 06/23/2017   CHOLHDL 2.3 06/23/2017   LDLCALC 76 06/23/2017   Lab Results  Component Value Date   TSH 0.904 02/13/2017    Therapeutic Level Labs: No results found for: LITHIUM No results found for: VALPROATE No components found for:  CBMZ  Current  Medications: Current Outpatient Medications  Medication Sig Dispense Refill  . amphetamine-dextroamphetamine (ADDERALL) 10 MG tablet Take 1 tablet (10 mg total) by mouth 2 (two) times daily. 60 tablet 0  . [START ON 04/30/2018] amphetamine-dextroamphetamine (ADDERALL) 10 MG tablet Take 1 tablet (10 mg total) by mouth 2 (two) times daily. 60 tablet 0  . [START ON 05/29/2018] amphetamine-dextroamphetamine (ADDERALL) 10 MG tablet Take 1 tablet (10 mg total) by mouth 2 (two) times daily. 60 tablet 0  . SF 1.1 % GEL dental gel   2   No current facility-administered medications for this visit.      Musculoskeletal: Strength & Muscle Tone: within normal limits Gait & Station: normal Patient leans: N/A  Psychiatric Specialty Exam: Review of Systems  Psychiatric/Behavioral: The patient is not nervous/anxious.   All other systems reviewed and are negative.   Blood pressure 131/80, pulse 60, height 5\' 2"  (1.575 m), weight 138 lb (62.6 kg), SpO2 99 %.Body mass index is 25.24 kg/m.  General Appearance: Casual  Eye Contact:  Fair  Speech:  Clear and Coherent  Volume:  Normal  Mood:  Euthymic  Affect:  Congruent  Thought Process:  Goal Directed and Descriptions of Associations: Intact  Orientation:  Full (Time, Place, and Person)  Thought Content: Logical   Suicidal Thoughts:  No  Homicidal Thoughts:  No  Memory:  Immediate;   Fair Recent;   Fair Remote;   Fair  Judgement:  Fair  Insight:  Fair  Psychomotor Activity:  Normal  Concentration:  Concentration: Fair and Attention Span: Fair  Recall:  FiservFair  Fund of Knowledge: Fair  Language: Fair  Akathisia:  No  Handed:  Right  AIMS (if indicated): na  Assets:  Communication Skills Desire for Improvement Social Support  ADL's:  Intact  Cognition: WNL  Sleep:  Fair   Screenings: PHQ2-9     [facility]  PHQ-2 Total Score  0       Assessment and Plan: Susan Hoffman is a 27 year old Caucasian  female, employed, married, history of ADHD, presented to the clinic today for a follow-up visit.  Patient continues to do well on her current medication regimen.  Plan ADHD Continue Adderall 10 mg p.o. twice daily.  I have provided 3 prescriptions with date specified.  The last one to be filled on or after 05/29/2018. I have reviewed [place] controlled substance database.  Have reviewed vital signs.  Follow-up in clinic in 3 months or sooner if needed.  More than 50 % of the time was spent for psychoeducation and supportive psychotherapy and care coordination.  This note was generated in part or  whole with voice recognition software. Voice recognition is usually quite accurate but there are transcription errors that can and very often do occur. I apologize for any typographical errors that were not detected and corrected.        Jomarie Longs, MD 04/02/2018, 12:12 PM

## 2018-06-15 ENCOUNTER — Ambulatory Visit: Payer: Self-pay | Admitting: Physician Assistant

## 2018-06-15 ENCOUNTER — Encounter: Payer: Self-pay | Admitting: Physician Assistant

## 2018-06-15 VITALS — BP 100/80 | HR 76 | Temp 97.8°F | Resp 16 | Ht 64.0 in | Wt 139.0 lb

## 2018-06-15 DIAGNOSIS — R52 Pain, unspecified: Secondary | ICD-10-CM

## 2018-06-15 DIAGNOSIS — R6883 Chills (without fever): Secondary | ICD-10-CM

## 2018-06-15 DIAGNOSIS — R519 Headache, unspecified: Secondary | ICD-10-CM

## 2018-06-15 DIAGNOSIS — M436 Torticollis: Secondary | ICD-10-CM

## 2018-06-15 DIAGNOSIS — R51 Headache: Secondary | ICD-10-CM

## 2018-06-15 LAB — POCT INFLUENZA A/B
Influenza A, POC: NEGATIVE
Influenza B, POC: NEGATIVE

## 2018-06-15 NOTE — Progress Notes (Signed)
Patient ID: Susan Hoffman DOB: 24-Feb-1991 AGE: 28 y.o. MRN: [id number]   PCP: Reubin Milan, MD   Chief Complaint:  Chief Complaint  Patient presents with  . Chills    x2d  . Generalized Body Aches    x2d  . Headache    x2d     Subjective:    HPI:  Susan Hoffman is a 28 y.o. female presents for evaluation  Chief Complaint  Patient presents with  . Chills    x2d  . Generalized Body Aches    x2d  . Headache    x57d    28 year old female presents to [facility] with three day history of illness. Began Sunday 06/13/2018 morning. Began with malaise/fatigue and diffuse body aches. States felt hard to get out of bed. Associated chills/sweats. Does not have thermometer; did not take temperature. Associated headache. Located bilateral temporal area and occipital area. Episodes of stabbing pain; primarily dull ache. Associated neck stiffness/soreness; paraspinal area, equal bilaterally. States head feels heavy. Associated dizziness, describes as room spinning sensation and imbalance, occurs with moving from sitting to standing position. Headache also improves with lying flat and worsens with standing up. Patient also reports sense of confusion/difficulty focusing. Was severe Sunday and yesterday; today states she feels 50% better. Took OTC ibuprofen 600mg  yesterday evening for headache, minimal improvement.  Denies head injury/trauma. Denies associated URI symptoms; other than nasal congestion this morning from the cold weather. Denies presyncope/syncope sensation, arm/leg weakness/paresthesias, photosensitivity (more than per her normal), ear pain, tinnitus, sinus pain, sore throat, cough, chest pain, SOB, wheezing, nausea/vomiting, abdominal pain, diarrhea, rash.  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 Multiple sick exposures. Patient did receive this season's influenza vaccination.  Patient states the last time she had  diffuse body aches was due to [place] Nile virus; 10 years ago, when living in [place]. Patient denies recent travel to [place], or contact with someone recently traveling in [place].  A limited review of symptoms was performed, pertinent positives and negatives as mentioned in HPI.  The following portions of the patient's history were reviewed and updated as appropriate: allergies, current medications and past medical history.  Patient Active Problem List   Diagnosis Date Noted  . Abnormal Pap smear of cervix 02/13/2017  . ADHD 02/13/2017  . Dermatitis 02/13/2017  . 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 Codeine   . Mushroom Extract Complex     Current Outpatient Medications on File Prior to Visit  Medication Sig Dispense Refill  . amphetamine-dextroamphetamine (ADDERALL) 10 MG tablet Take 1 tablet (10 mg total) by mouth 2 (two) times daily. 60 tablet 0   No current facility-administered medications on file prior to visit.        Objective:   Vitals:   06/15/18 1141  BP: 100/80  Pulse: 76  Resp: 16  Temp: 97.8 F (36.6 C)  SpO2: 98%     Wt Readings from Last 3 Encounters:  06/15/18 139 lb (63 kg)  12/11/17 135 lb (61.2 kg)  08/31/17 133 lb (60.3 kg)    Physical Exam:   General Appearance:  Patient sitting comfortably on examination table. Conversational. Peri Jefferson self-historian. In no acute distress. Afebrile.   Patient, when MA or provider leaves room, will lie down flat on bed for rest.  Head:  Normocephalic, without obvious abnormality, atraumatic  Eyes:  PERRL, conjunctiva/corneas clear, EOM's intact. No nystagmus; patient able to  follow finger with eyes, however during accomodation, states exacerbates headache. Noticeable light sensitivity; patient states per her normal.  Ears:  Bilateral ear canals WNL. No erythema or edema. No discharge/drainage. Right TM with significant scar tissue and no identifiable landmarks (known injury to  patient years ago, has resulted in loss of hearing from right ear). No erythema. No active discharge/drainage. Left TM WNL.  Nose: Nares normal, septum midline. No discharge. Normal mucosa. No sinus tenderness with percussion/palpation.  Throat: Lips, mucosa, and tongue normal; teeth and gums normal. Throat reveals no erythema. Tonsils with no enlargement or exudate.  Neck: Supple, symmetrical, trachea midline, no adenopathy  Lungs:   Clear to auscultation bilaterally, respirations unlabored  Heart:  Regular rate and rhythm, S1 and S2 normal, no murmur, rub, or gallop  Extremities: Extremities normal, atraumatic, no cyanosis or edema Neck normal to inspection. No midline cervical spine tenderness. No palpable stepoff, deformity, or crepitus. Full ROM; pain with lateral flexion and forward flexion. No nuchal rigidity; negative Brudzinski's sign.  Pulses: 2+ and symmetric  Skin: Skin color, texture, turgor normal, no rashes or lesions  Lymph nodes: Cervical, supraclavicular, and axillary nodes normal  Neurologic: Normal. No gait abnormality. No pronator drift. Finger to nose WNL. RAMs WNL. Mild imbalance with Romberg; patient quickly recovers.    Assessment & Plan:    Exam findings, diagnosis etiology and medication use and indications reviewed with patient. Follow-Up and discharge instructions provided. No emergent/urgent issues found on exam.  Patient education was provided.   Patient verbalized understanding of information provided and agrees with plan of care (POC), all questions answered. The patient is advised to call or return to clinic if condition does not see an improvement in symptoms, or to seek the care of the closest emergency department if condition worsens with the below plan.    1. Nonintractable headache, unspecified chronicity pattern, unspecified headache type  2. Chills - POCT Influenza A/B  3. Neck stiffness  4. Generalized body aches  Patient with three day  history of generalized body aches, headache, neck stiffness/soreness, and dizziness. Negative rapid flu test. VSS, afebrile, in no acute distress (appears mildly ill/uncomfortable). Had long discussion with patient; possible her symptoms are due to a self limited viral illness, however feel her illness warrants further evaluation, suspect she would benefit from blood work performed at the urgent care or ED. Further evaluation based on results of blood work; possible LP, CT scan of head/neck, CXR, vs other. Patient states she plans on taking dose of ibuprofen and returning to work, will closely monitor symptoms, will go to urgent care this evening or tomorrow morning if no better. Advised patient go directly to the ED with any worsening symptoms or new/concerning symptoms. Patient agrees with plan.   Janalyn Harder, MHS, PA-C Rulon Sera, MHS, PA-C Advanced Practice Provider [facility]  [address] [facility], [address] [place], [place] [address] (p):  [phone] [email or web address]

## 2018-06-15 NOTE — Patient Instructions (Addendum)
Thank you for choosing [facility] for your health care needs.  Your rapid flu test was NEGATIVE.  Possible viral illness.  Recommend increase fluids; water, Gatorade, diluted juice (1/2 water and 1/2 juice). Rest. Take over the counter Tylenol or ibuprofen for headache and body aches.  May apply heating pad to back of neck. Perform gentle stretching exercise.  Follow-up with ED or urgent care this evening or tomorrow if symptoms do not continue to improve. Go sooner with any worsening, new/concerning symptoms.  Hope you feel better soon!  General Headache Without Cause A headache is pain or discomfort that is felt around the head or neck area. There are many causes and types of headaches. In some cases, the cause may not be found. Follow these instructions at home: Watch your condition for any changes. Let your doctor know about them. Take these steps to help with your condition: Managing pain      Take over-the-counter and prescription medicines only as told by your doctor.  Lie down in a dark, quiet room when you have a headache.  If told, put ice on your head and neck area: ? Put ice in a plastic bag. ? Place a towel between your skin and the bag. ? Leave the ice on for 20 minutes, 2-3 times per day.  If told, put heat on the affected area. Use the heat source that your doctor recommends, such as a moist heat pack or a heating pad. ? Place a towel between your skin and the heat source. ? Leave the heat on for 20-30 minutes. ? Remove the heat if your skin turns bright red. This is very important if you are unable to feel pain, heat, or cold. You may have a greater risk of getting burned.  Keep lights dim if bright lights bother you or make your headaches worse. Eating and drinking  Eat meals on a regular schedule.  If you drink alcohol: ? Limit how much you use to:  0-1 drink a day for women.  0-2 drinks a day for men. ? Be aware of how much alcohol is in your  drink. In the [place] one drink equals one 12 oz bottle of beer (355 mL), one 5 oz glass of wine (148 mL), or one 1 oz glass of hard liquor (44 mL).  Stop drinking caffeine, or reduce how much caffeine you drink. General instructions   Keep a journal to find out if certain things bring on headaches. For example, write down: ? What you eat and drink. ? How much sleep you get. ? Any change to your diet or medicines.  Get a massage or try other ways to relax.  Limit stress.  Sit up straight. Do not tighten (tense) your muscles.  Do not use any products that contain nicotine or tobacco. This includes cigarettes, e-cigarettes, and chewing tobacco. If you need help quitting, ask your doctor.  Exercise regularly as told by your doctor.  Get enough sleep. This often means 7-9 hours of sleep each night.  Keep all follow-up visits as told by your doctor. This is important. Contact a doctor if:  Your symptoms are not helped by medicine.  You have a headache that feels different than the other headaches.  You feel sick to your stomach (nauseous) or you throw up (vomit).  You have a fever. Get help right away if:  Your headache gets very bad quickly.  Your headache gets worse after a lot of physical activity.  You keep throwing  up.  You have a stiff neck.  You have trouble seeing.  You have trouble speaking.  You have pain in the eye or ear.  Your muscles are weak or you lose muscle control.  You lose your balance or have trouble walking.  You feel like you will pass out (faint) or you pass out.  You are mixed up (confused).  You have a seizure. Summary  A headache is pain or discomfort that is felt around the head or neck area.  There are many causes and types of headaches. In some cases, the cause may not be found.  Keep a journal to help find out what causes your headaches. Watch your condition for any changes. Let your doctor know about them.  Contact a doctor  if you have a headache that is different from usual, or if your headache is not helped by medicine.  Get help right away if your headache gets very bad, you throw up, you have trouble seeing, you lose your balance, or you have a seizure. This information is not intended to replace advice given to you by your health care provider. Make sure you discuss any questions you have with your health care provider. Document Released: 01/22/2008 Document Revised: 11/02/2017 Document Reviewed: 11/02/2017 Elsevier Interactive Patient Education  2019 [employer].

## 2018-06-17 ENCOUNTER — Telehealth: Payer: Self-pay | Admitting: Emergency Medicine

## 2018-06-17 NOTE — Telephone Encounter (Signed)
Left message following up on visit with [facility] 

## 2018-06-23 ENCOUNTER — Encounter: Payer: Self-pay | Admitting: Internal Medicine

## 2018-06-23 ENCOUNTER — Other Ambulatory Visit: Payer: Self-pay

## 2018-06-23 ENCOUNTER — Ambulatory Visit (INDEPENDENT_AMBULATORY_CARE_PROVIDER_SITE_OTHER): Payer: 59 | Admitting: Internal Medicine

## 2018-06-23 VITALS — BP 119/78 | HR 78 | Resp 16 | Ht 64.0 in | Wt 138.0 lb

## 2018-06-23 DIAGNOSIS — R87619 Unspecified abnormal cytological findings in specimens from cervix uteri: Secondary | ICD-10-CM | POA: Diagnosis not present

## 2018-06-23 DIAGNOSIS — Z0000 Encounter for general adult medical examination without abnormal findings: Secondary | ICD-10-CM

## 2018-06-23 DIAGNOSIS — Z23 Encounter for immunization: Secondary | ICD-10-CM | POA: Diagnosis not present

## 2018-06-23 DIAGNOSIS — F900 Attention-deficit hyperactivity disorder, predominantly inattentive type: Secondary | ICD-10-CM

## 2018-06-23 LAB — POCT URINALYSIS DIPSTICK
BILIRUBIN UA: NEGATIVE
Blood, UA: NEGATIVE
Glucose, UA: NEGATIVE
KETONES UA: NEGATIVE
Leukocytes, UA: NEGATIVE
NITRITE UA: NEGATIVE
Protein, UA: NEGATIVE
SPEC GRAV UA: 1.015 (ref 1.010–1.025)
UROBILINOGEN UA: 0.2 U/dL
pH, UA: 6 (ref 5.0–8.0)

## 2018-06-23 NOTE — Progress Notes (Signed)
Date:  06/23/2018   Name:  Susan Hoffman   DOB:  [birth year]   MRN:  [id number]   Chief Complaint: Annual Exam Susan Hoffman is a 28 y.o. female who presents today for her Complete Annual Exam. She feels well. She reports exercising regularly. She reports she is sleeping well. Pap was normal last year. She believes that she has had Gardasil several years ago at her GYN. She is using nexplanon for contraception.  She is considering starting a family in the next few years.  HPI  Review of Systems  Constitutional: Negative for chills, fatigue and fever.  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 Negative for congestion, tinnitus, trouble swallowing and voice change.   Eyes: Negative for visual disturbance.  Respiratory: Negative for cough, chest tightness, shortness of breath and wheezing.   Cardiovascular: Negative for chest pain, palpitations and leg swelling.  Gastrointestinal: Negative for abdominal pain, constipation, diarrhea and vomiting.  Endocrine: Negative for polydipsia and polyuria.  Genitourinary: Negative for dysuria, frequency, genital sores, menstrual problem, vaginal bleeding and vaginal discharge.  Musculoskeletal: Positive for arthralgias (knee OA). Negative for gait problem and joint swelling.  Skin: Negative for color change and rash.  Neurological: Negative for dizziness, tremors, light-headedness and headaches.  Hematological: Negative for adenopathy. Does not bruise/bleed easily.  Psychiatric/Behavioral: Negative for dysphoric mood and sleep disturbance. The patient is not nervous/anxious.     Patient Active Problem List   Diagnosis Date Noted  . Abnormal Pap smear of cervix 02/13/2017  . ADHD 02/13/2017  . Dermatitis 02/13/2017  . 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 Codeine   . Mushroom Extract Complex     Past Surgical History:  Procedure Laterality Date  . nexplanon  2017  .  TYMPANOPLASTY    . wisdom teeth removal      Social History   Tobacco Use  . Smoking status: Never Smoker  . Smokeless tobacco: Never Used  Substance Use Topics  . Alcohol use: Yes    Alcohol/week: 2.0 standard drinks    Types: 1 Glasses of wine, 1 Cans of beer per week    Comment: wine weekly  . Drug use: No     Medication list has been reviewed and updated.  Current Meds  Medication Sig  . amphetamine-dextroamphetamine (ADDERALL) 10 MG tablet Take 1 tablet (10 mg total) by mouth 2 (two) times daily.  Marland Kitchen etonogestrel (NEXPLANON) 68 MG IMPL implant 1 each by Subdermal route once.    PHQ 2/9 Scores 06/23/2018 02/13/2017  PHQ - 2 Score 0 0  PHQ- 9 Score 0 -   Wt Readings from Last 3 Encounters:  06/23/18 138 lb (62.6 kg)  06/15/18 139 lb (63 kg)  12/11/17 135 lb (61.2 kg)    Physical Exam Vitals signs and nursing note reviewed.  Constitutional:      General: She is not in acute distress.    Appearance: She is well-developed.  HENT:     Head: Normocephalic and atraumatic.     Right Ear: Tympanic membrane and ear canal normal.     Left Ear: Tympanic membrane and ear canal normal.     Nose:     Right Sinus: No maxillary sinus tenderness.     Left Sinus: No maxillary sinus tenderness.     Mouth/Throat:     Pharynx: Uvula midline.  Eyes:     General: No scleral icterus.  Right eye: No discharge.        Left eye: No discharge.     Conjunctiva/sclera: Conjunctivae normal.  Neck:     Musculoskeletal: Normal range of motion. No erythema.     Thyroid: No thyromegaly.     Vascular: No carotid bruit.  Cardiovascular:     Rate and Rhythm: Normal rate and regular rhythm.     Pulses: Normal pulses.     Heart sounds: Normal heart sounds.  Pulmonary:     Effort: Pulmonary effort is normal. No respiratory distress.     Breath sounds: No wheezing.  Chest:     Breasts:        Right: No mass, nipple discharge, skin change or tenderness.        Left: No mass, nipple  discharge, skin change or tenderness.  Abdominal:     General: Bowel sounds are normal.     Palpations: Abdomen is soft.     Tenderness: There is no abdominal tenderness.  Musculoskeletal: Normal range of motion.  Lymphadenopathy:     Cervical: No cervical adenopathy.  Skin:    General: Skin is warm and dry.     Findings: No rash.  Neurological:     Mental Status: She is alert and oriented to person, place, and time.     Cranial Nerves: No cranial nerve deficit.     Sensory: No sensory deficit.     Deep Tendon Reflexes: Reflexes are normal and symmetric.  Psychiatric:        Speech: Speech normal.        Behavior: Behavior normal.        Thought Content: Thought content normal.     BP 119/78   Pulse 78   Resp 16   Ht 5\' 4"  (1.626 m)   Wt 138 lb (62.6 kg)   SpO2 98%   BMI 23.69 kg/m   Assessment and Plan: 1. Annual physical exam Normal exam Continue healthy diet and exercise - Comprehensive metabolic panel - CBC with Differential/Platelet - TSH - POCT urinalysis dipstick  2. Abnormal cervical Papanicolaou smear, unspecified abnormal pap finding Normal last year - return to q 3 yr testing  3. Attention deficit hyperactivity disorder (ADHD), predominantly inattentive type On Adderall from Psychiatry  4. Need for diphtheria-tetanus-pertussis (Tdap) vaccine - Tdap vaccine greater than or equal to 7yo IM   Partially dictated using Animal nutritionist. Any errors are unintentional.  Bari Edward, MD [facility]  06/23/2018

## 2018-06-23 NOTE — Patient Instructions (Addendum)
Please Mychart me with dates of HPV vaccines.  Tdap Vaccine (Tetanus, Diphtheria and Pertussis): What You Need to Know 1. Why get vaccinated? Tetanus, diphtheria and pertussis are very serious diseases. Tdap vaccine can protect [place] from these diseases. And, Tdap vaccine given to pregnant women can protect newborn babies against pertussis.Marland Kitchen TETANUS (Lockjaw) is rare in the [place] today. It causes painful muscle tightening and stiffness, usually all over the body.  It can lead to tightening of muscles in the head and neck so you can't open your mouth, swallow, or sometimes even breathe. Tetanus kills about 1 out of 10 people who are infected even after receiving the best medical care. DIPHTHERIA is also rare in the [place] today. It can cause a thick coating to form in the back of the throat.  It can lead to breathing problems, heart failure, paralysis, and death. PERTUSSIS (Whooping Cough) causes severe coughing spells, which can cause difficulty breathing, vomiting and disturbed sleep.  It can also lead to weight loss, incontinence, and rib fractures. Up to 2 in 100 adolescents and 5 in 100 adults with pertussis are hospitalized or have complications, which could include pneumonia or death. These diseases are caused by bacteria. Diphtheria and pertussis are spread from person to person through secretions from coughing or sneezing. Tetanus enters the body through cuts, scratches, or wounds. Before vaccines, as many as 200,000 cases of diphtheria, 200,000 cases of pertussis, and hundreds of cases of tetanus, were reported in the [place] each year. Since vaccination began, reports of cases for tetanus and diphtheria have dropped by about 99% and for pertussis by about 80%. 2. Tdap vaccine Tdap vaccine can protect adolescents and adults from tetanus, diphtheria, and pertussis. One dose of Tdap is routinely given at age 28 or 54. People who did not get Tdap at that age should get it as  soon as possible. Tdap is especially important for healthcare professionals and anyone having close contact with a baby younger than 12 months. Pregnant women should get a dose of Tdap during every pregnancy, to protect the newborn from pertussis. Infants are most at risk for severe, life-threatening complications from pertussis. Another vaccine, called Td, protects against tetanus and diphtheria, but not pertussis. A Td booster should be given every 10 years. Tdap may be given as one of these boosters if you have never gotten Tdap before. Tdap may also be given after a severe cut or burn to prevent tetanus infection. Your doctor or the person giving you the vaccine can give you more information. Tdap may safely be given at the same time as other vaccines. 3. Some people should not get this vaccine  A person who has ever had a life-threatening allergic reaction after a previous dose of any diphtheria, tetanus or pertussis containing vaccine, OR has a severe allergy to any part of this vaccine, should not get Tdap vaccine. Tell the person giving the vaccine about any severe allergies.  Anyone who had coma or long repeated seizures within 7 days after a childhood dose of DTP or DTaP, or a previous dose of Tdap, should not get Tdap, unless a cause other than the vaccine was found. They can still get Td.  Talk to your doctor if you: ? have seizures or another nervous system problem, ? had severe pain or swelling after any vaccine containing diphtheria, tetanus or pertussis, ? ever had a condition called Guillain-Barr Syndrome (GBS), ? aren't feeling well on the day the shot is scheduled.  4. Risks With any medicine, including vaccines, there is a chance of side effects. These are usually mild and go away on their own. Serious reactions are also possible but are rare. Most people who get Tdap vaccine do not have any problems with it. Mild problems following Tdap (Did not interfere with  activities)  Pain where the shot was given (about 3 in 4 adolescents or 2 in 3 adults)  Redness or swelling where the shot was given (about 1 person in 5)  Mild fever of at least 100.73F (up to about 1 in 25 adolescents or 1 in 100 adults)  Headache (about 3 or 4 people in 10)  Tiredness (about 1 person in 3 or 4)  Nausea, vomiting, diarrhea, stomach ache (up to 1 in 4 adolescents or 1 in 10 adults)  Chills, sore joints (about 1 person in 10)  Body aches (about 1 person in 3 or 4)  Rash, swollen glands (uncommon) Moderate problems following Tdap (Interfered with activities, but did not require medical attention)  Pain where the shot was given (up to 1 in 5 or 6)  Redness or swelling where the shot was given (up to about 1 in 16 adolescents or 1 in 12 adults)  Fever over 102F (about 1 in 100 adolescents or 1 in 250 adults)  Headache (about 1 in 7 adolescents or 1 in 10 adults)  Nausea, vomiting, diarrhea, stomach ache (up to 1 or 3 people in 100)  Swelling of the entire arm where the shot was given (up to about 1 in 500). Severe problems following Tdap (Unable to perform usual activities; required medical attention)  Swelling, severe pain, bleeding and redness in the arm where the shot was given (rare). Problems that could happen after any vaccine:  People sometimes faint after a medical procedure, including vaccination. Sitting or lying down for about 15 minutes can help prevent fainting, and injuries caused by a fall. Tell your doctor if you feel dizzy, or have vision changes or ringing in the ears.  Some people get severe pain in the shoulder and have difficulty moving the arm where a shot was given. This happens very rarely.  Any medication can cause a severe allergic reaction. Such reactions from a vaccine are very rare, estimated at fewer than 1 in a million doses, and would happen within a few minutes to a few hours after the vaccination. As with any medicine, there  is a very remote chance of a vaccine causing a serious injury or death. The safety of vaccines is always being monitored. For more information, visit: http://floyd.org/ 5. What if there is a serious problem? What should I look for?  Look for anything that concerns you, such as signs of a severe allergic reaction, very high fever, or unusual behavior. Signs of a severe allergic reaction can include hives, swelling of the face and throat, difficulty breathing, a fast heartbeat, dizziness, and weakness. These would usually start a few minutes to a few hours after the vaccination. What should I do?  If you think it is a severe allergic reaction or other emergency that can't wait, call 9-1-1 or get the person to the nearest hospital. Otherwise, call your doctor.  Afterward, the reaction should be reported to the Vaccine Adverse Event Reporting System (VAERS). Your doctor might file this report, or you can do it yourself through the VAERS web site at www.vaers.LAgents.no, or by calling 1-626-669-3173. VAERS does not give medical advice. 6. The National Vaccine Injury Compensation  Program The [employer] Injury Compensation Program (VICP) is a federal program that was created to compensate people who may have been injured by certain vaccines. Persons who believe they may have been injured by a vaccine can learn about the program and about filing a claim by calling 1-440 802 2318 or visiting the VICP website at SpiritualWord.at. There is a time limit to file a claim for compensation. 7. How can I learn more?  Ask your doctor. He or she can give you the vaccine package insert or suggest other sources of information.  Call your local or state health department.  Contact the Centers for Disease Control and Prevention (CDC): ? Call 901 543 6010 (1-800-CDC-INFO) or ? Visit CDC's website at PicCapture.uy Vaccine Information Statement Tdap Vaccine (06/21/2013) This  information is not intended to replace advice given to you by your health care provider. Make sure you discuss any questions you have with your health care provider. Document Released: 10/14/2011 Document Revised: 11/30/2017 Document Reviewed: 11/30/2017 Elsevier Interactive Patient Education  2019 [employer].

## 2018-06-24 LAB — COMPREHENSIVE METABOLIC PANEL
A/G RATIO: 2.1 (ref 1.2–2.2)
ALBUMIN: 4.7 g/dL (ref 3.9–5.0)
ALT: 14 IU/L (ref 0–32)
AST: 19 IU/L (ref 0–40)
Alkaline Phosphatase: 58 IU/L (ref 39–117)
BUN/Creatinine Ratio: 7 — ABNORMAL LOW (ref 9–23)
BUN: 7 mg/dL (ref 6–20)
Bilirubin Total: 0.7 mg/dL (ref 0.0–1.2)
CALCIUM: 9.8 mg/dL (ref 8.7–10.2)
CHLORIDE: 105 mmol/L (ref 96–106)
CO2: 21 mmol/L (ref 20–29)
Creatinine, Ser: 0.95 mg/dL (ref 0.57–1.00)
GFR, EST AFRICAN AMERICAN: 95 mL/min/{1.73_m2} (ref >59)
GFR, EST NON AFRICAN AMERICAN: 82 mL/min/{1.73_m2} (ref >59)
Globulin, Total: 2.2 g/dL (ref 1.5–4.5)
Glucose: 86 mg/dL (ref 65–99)
Potassium: 4.4 mmol/L (ref 3.5–5.2)
SODIUM: 139 mmol/L (ref 134–144)
Total Protein: 6.9 g/dL (ref 6.0–8.5)

## 2018-06-24 LAB — CBC WITH DIFFERENTIAL/PLATELET
BASOS ABS: 0 10*3/uL (ref 0.0–0.2)
Basos: 0 %
EOS (ABSOLUTE): 0.1 10*3/uL (ref 0.0–0.4)
EOS: 1 %
Hematocrit: 41.8 % (ref 34.0–46.6)
Hemoglobin: 14.5 g/dL (ref 11.1–15.9)
IMMATURE GRANULOCYTES: 0 %
Immature Grans (Abs): 0 10*3/uL (ref 0.0–0.1)
LYMPHS ABS: 2.4 10*3/uL (ref 0.7–3.1)
Lymphs: 41 %
MCH: 31.8 pg (ref 26.6–33.0)
MCHC: 34.7 g/dL (ref 31.5–35.7)
MCV: 92 fL (ref 79–97)
MONOS ABS: 0.3 10*3/uL (ref 0.1–0.9)
Monocytes: 5 %
NEUTROS PCT: 53 %
Neutrophils Absolute: 3.1 10*3/uL (ref 1.4–7.0)
PLATELETS: 291 10*3/uL (ref 150–450)
RBC: 4.56 x10E6/uL (ref 3.77–5.28)
RDW: 11.7 % (ref 11.7–15.4)
WBC: 5.9 10*3/uL (ref 3.4–10.8)

## 2018-06-24 LAB — TSH: TSH: 1.03 u[IU]/mL (ref 0.450–4.500)

## 2018-06-25 NOTE — Progress Notes (Signed)
Patient informed. 

## 2018-06-28 ENCOUNTER — Ambulatory Visit
Admission: RE | Admit: 2018-06-28 | Discharge: 2018-06-28 | Disposition: A | Discharge status: Home / Self Care | Payer: 59 | Source: Ambulatory Visit | Attending: Internal Medicine | Admitting: Internal Medicine

## 2018-06-28 ENCOUNTER — Encounter: Payer: Self-pay | Admitting: Internal Medicine

## 2018-06-28 ENCOUNTER — Other Ambulatory Visit: Payer: Self-pay

## 2018-06-28 DIAGNOSIS — M898X1 Other specified disorders of bone, shoulder: Secondary | ICD-10-CM | POA: Insufficient documentation

## 2018-06-28 DIAGNOSIS — M25511 Pain in right shoulder: Secondary | ICD-10-CM | POA: Diagnosis not present

## 2018-07-02 ENCOUNTER — Ambulatory Visit (INDEPENDENT_AMBULATORY_CARE_PROVIDER_SITE_OTHER): Payer: 59 | Admitting: Psychiatry

## 2018-07-02 ENCOUNTER — Encounter: Payer: Self-pay | Admitting: Psychiatry

## 2018-07-02 ENCOUNTER — Other Ambulatory Visit: Payer: Self-pay

## 2018-07-02 VITALS — BP 121/81 | HR 62 | Temp 98.7°F | Wt 139.8 lb

## 2018-07-02 DIAGNOSIS — F901 Attention-deficit hyperactivity disorder, predominantly hyperactive type: Secondary | ICD-10-CM | POA: Diagnosis not present

## 2018-07-02 MED ORDER — AMPHETAMINE-DEXTROAMPHETAMINE 10 MG PO TABS: 10.0000 mg | ORAL_TABLET | Freq: Two times a day (BID) | ORAL | 0 refills | Status: DC

## 2018-07-02 NOTE — Progress Notes (Signed)
BH MD OP Progress Note  07/02/2018 12:47 PM Susan Hoffman  MRN:  [id number]  Chief Complaint: ' I am here for follow up." Chief Complaint    Follow-up; Medication Refill     HPI: Susan Hoffman is a 28 year old Caucasian female, employed, married, lives in [place], presented to clinic today for a follow-up visit.  Patient has a history of ADHD.  Patient reports she continues to be compliant with her Adderall 10 mg twice a day.  She reports she continues to be able to focus and function at work.  She denies any appetite suppression, sleep problems or other side effects of the Adderall.  Patient denies any suicidality, homicidality or perceptual disturbances.  She reports she continues to exercise.  She continues to have good social support system from her family.  Patient denies any other concerns today. Visit Diagnosis:    ICD-10-CM   1. 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  I have reviewed psychiatric history from my progress note on 05/08/2017.  Past Medical History:  Past Medical History:  Diagnosis Date  . Abnormal cells of cervix 2016   Was told needs PAP every 6 months due to abnormal cells.   . Acquired deafness of right ear 2015  . ADHD     Past Surgical History:  Procedure Laterality Date  . nexplanon  2017  . TYMPANOPLASTY    . wisdom teeth removal      Family Psychiatric History: I have reviewed family psychiatric history from my progress note on 05/08/2017  Family History:  Family History  Problem Relation Age of Onset  . Hypertension Father   . Hyperthyroidism Father   . Lung cancer Maternal Grandmother   . Lung cancer Paternal Grandmother   . Lung cancer Paternal Grandfather   . Cancer  Maternal Aunt   . Ovarian cancer Neg Hx   . AAA (abdominal aortic aneurysm) Neg Hx   . Colon cancer Neg Hx   . Diabetes Neg Hx     Social History: Reviewed social history from my progress note on 05/08/2017 Social History   Socioeconomic History  . Marital status: Married    Spouse name: jonathan  . Number of children: 0  . Years of education: Not on file  . Highest education level: Doctorate  Occupational History    Comment: full time  Social Needs  . Financial resource strain: Not hard at all  . Food insecurity:    Worry: Never true    Inability: Never true  . Transportation needs:    Medical: No    Non-medical: No  Tobacco Use  . Smoking status: Never Smoker  . Smokeless tobacco: Never Used  Substance and Sexual Activity  . Alcohol use: Yes    Alcohol/week: 2.0 standard drinks    Types: 1 Glasses of wine, 1 Cans of beer per week    Comment: wine weekly  . Drug use: No  . Sexual activity: Yes    Birth control/protection: Implant  Lifestyle  . Physical activity:    Days per week: 4 days    Minutes per session: 60 min  . Stress: Only a little  Relationships  . Social connections:    Talks on phone: More than three times a week    Gets together: Three times a week  Attends religious service: Never    Active member of club or organization: Yes    Attends meetings of clubs or organizations: More than 4 times per year    Relationship status: Married  Other Topics Concern  . Not on file  Social History Narrative  . Not on file    Allergies:  Allergies  Allergen Reactions  . Codeine   . Mushroom Extract Complex     Metabolic Disorder Labs: No results found for: HGBA1C, MPG No results found for: PROLACTIN Lab Results  Component Value Date   CHOL 158 06/23/2017   TRIG 61 06/23/2017   HDL 70 06/23/2017   CHOLHDL 2.3 06/23/2017   LDLCALC 76 06/23/2017   Lab Results  Component Value Date   TSH 1.030 06/23/2018   TSH 0.904 02/13/2017    Therapeutic  Level Labs: No results found for: LITHIUM No results found for: VALPROATE No components found for:  CBMZ  Current Medications: Current Outpatient Medications  Medication Sig Dispense Refill  . [START ON 07/05/2018] amphetamine-dextroamphetamine (ADDERALL) 10 MG tablet Take 1 tablet (10 mg total) by mouth 2 (two) times daily. 60 tablet 0  . etonogestrel (NEXPLANON) 68 MG IMPL implant 1 each by Subdermal route once.    Melene Muller ON 08/04/2018] amphetamine-dextroamphetamine (ADDERALL) 10 MG tablet Take 1 tablet (10 mg total) by mouth 2 (two) times daily. 60 tablet 0  . [START ON 09/02/2018] amphetamine-dextroamphetamine (ADDERALL) 10 MG tablet Take 1 tablet (10 mg total) by mouth 2 (two) times daily. 60 tablet 0   No current facility-administered medications for this visit.      Musculoskeletal: Strength & Muscle Tone: within normal limits Gait & Station: normal Patient leans: N/A  Psychiatric Specialty Exam: Review of Systems  Psychiatric/Behavioral: Negative for depression. The patient is not nervous/anxious.   All other systems reviewed and are negative.   Blood pressure 121/81, pulse 62, temperature 98.7 F (37.1 C), temperature source Oral, weight 139 lb 12.8 oz (63.4 kg).Body mass index is 24 kg/m.  General Appearance: Casual  Eye Contact:  Fair  Speech:  Normal Rate  Volume:  Normal  Mood:  Euthymic  Affect:  Congruent  Thought Process:  Goal Directed and Descriptions of Associations: Intact  Orientation:  Full (Time, Place, and Person)  Thought Content: Logical   Suicidal Thoughts:  No  Homicidal Thoughts:  No  Memory:  Immediate;   Fair Recent;   Fair Remote;   Fair  Judgement:  Fair  Insight:  Good  Psychomotor Activity:  Normal  Concentration:  Concentration: Fair and Attention Span: Fair  Recall:  [employer] of Knowledge: Fair  Language: Fair  Akathisia:  No  Handed:  Right  AIMS (if indicated): na  Assets:  Communication Skills Desire for Improvement Social  Support  ADL's:  Intact  Cognition: WNL  Sleep:  Fair   Screenings: PHQ2-9     Office Visit from 06/23/2018 in [facility] Office Visit from 02/13/2017 in [facility]  PHQ-2 Total Score  0  0  PHQ-9 Total Score  0  -       Assessment and Plan: [name] is a 28 yr old Caucasian female, employed, married, has a history of ADHD, presented to clinic today for a follow-up visit.  Patient is currently tolerating the medications well.  Denies any side effects.  Plan as noted below.  Plan ADHD- stable Continue Adderall 10 mg p.o. twice daily. I have provided 3 prescriptions with date specified.  The  last 1 to be filled on or after 09/02/2018. I have reviewed vital signs. I have reviewed [place] controlled substance database.  Follow-up in clinic in 3 months or sooner if needed  I have spent atleast 10 minutes face to face with patient today. More than 50 % of the time was spent for psychoeducation and supportive psychotherapy and care coordination.  This note was generated in part or whole with voice recognition software. Voice recognition is usually quite accurate but there are transcription errors that can and very often do occur. I apologize for any typographical errors that were not detected and corrected.       Jomarie Longs, MD 07/02/2018, 12:47 PM

## 2018-09-24 ENCOUNTER — Ambulatory Visit (INDEPENDENT_AMBULATORY_CARE_PROVIDER_SITE_OTHER): Payer: 59 | Admitting: Psychiatry

## 2018-09-24 ENCOUNTER — Other Ambulatory Visit: Payer: Self-pay

## 2018-09-24 ENCOUNTER — Encounter: Payer: Self-pay | Admitting: Psychiatry

## 2018-09-24 DIAGNOSIS — F901 Attention-deficit hyperactivity disorder, predominantly hyperactive type: Secondary | ICD-10-CM

## 2018-09-24 NOTE — Progress Notes (Signed)
Virtual Visit via Video Note  I connected with Susan Hoffman on 09/24/18 at 11:30 AM EDT by a video enabled telemedicine application and verified that I am speaking with the correct person using two identifiers.   I discussed the limitations of evaluation and management by telemedicine and the availability of in person appointments. The patient expressed understanding and agreed to proceed.   I discussed the assessment and treatment plan with the patient. The patient was provided an opportunity to ask questions and all were answered. The patient agreed with the plan and demonstrated an understanding of the instructions.   The patient was advised to call back or seek an in-person evaluation if the symptoms worsen or if the condition fails to improve as anticipated.   BH MD OP Progress Note  09/24/2018 12:23 PM Susan Hoffman  MRN:  [id number]  Chief Complaint:  Chief Complaint    Follow-up     HPI: Susan Hoffman is a 28 year old Caucasian female, employed, married, lives in [place], was evaluated by telemedicine today.  She has a history of ADHD.  Patient today reports she has been sick with food poisoning the past couple of days.  She reports she was asked by her work to take at least 48 hours of to get some rest and self isolate due to the COVID-19 outbreak.  She reports she is feeling better now.  Patient reports she has been compliant with her Adderall as prescribed however since the past few days she has not been taking it since she was sick.  She reports the medication is helpful.  She denies any concerns at this time.  She denies suicidality, homicidality or perceptual disturbances.  She reports sleep is good.  She reports appetite is limited due to her recent food poisoning however otherwise she was doing okay.  She reports good social support system from her family.  She reports her work changed a little bit and currently she works both inpatient and outpatient however  she likes it better.  Patient denies any other concerns today.  visit Diagnosis:    ICD-10-CM   1. 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  Past Medical History:  Past Medical History:  Diagnosis Date  . Abnormal cells of cervix 2016   Was told needs PAP every 6 months due to abnormal cells.   . Acquired deafness of right ear 2015  . ADHD     Past Surgical History:  Procedure Laterality Date  . nexplanon  2017  . TYMPANOPLASTY    . wisdom teeth removal      Family Psychiatric History: Family psychiatric history from my progress note on 05/08/2017.  Family History:  Family History  Problem Relation Age of Onset  . Hypertension Father   . Hyperthyroidism Father   . Lung cancer Maternal Grandmother   . Lung cancer Paternal Grandmother   . Lung cancer Paternal Grandfather   . Cancer Maternal Aunt   . Ovarian cancer Neg Hx   . AAA (abdominal aortic aneurysm) Neg Hx   . Colon cancer Neg Hx   . Diabetes Neg Hx     Social History: Reviewed social history from my progress note on 05/08/2017. Social History   Socioeconomic History  . Marital status: Married    Spouse name: jonathan  . Number of children: 0  . Years of education: Not on file  .  Highest education level: Doctorate  Occupational History    Comment: full time  Social Needs  . Financial resource strain: Not hard at all  . Food insecurity:    Worry: Never true    Inability: Never true  . Transportation needs:    Medical: No    Non-medical: No  Tobacco Use  . Smoking status: Never Smoker  . Smokeless tobacco: Never Used  Substance and Sexual Activity  . Alcohol use: Yes    Alcohol/week: 2.0 standard drinks    Types: 1 Glasses of wine, 1 Cans of beer per week    Comment: wine weekly  . Drug use: No  . Sexual activity: Yes    Birth  control/protection: Implant  Lifestyle  . Physical activity:    Days per week: 4 days    Minutes per session: 60 min  . Stress: Only a little  Relationships  . Social connections:    Talks on phone: More than three times a week    Gets together: Three times a week    Attends religious service: Never    Active member of club or organization: Yes    Attends meetings of clubs or organizations: More than 4 times per year    Relationship status: Married  Other Topics Concern  . Not on file  Social History Narrative  . Not on file    Allergies:  Allergies  Allergen Reactions  . Codeine   . Mushroom Extract Complex     Metabolic Disorder Labs: No results found for: HGBA1C, MPG No results found for: PROLACTIN Lab Results  Component Value Date   CHOL 158 06/23/2017   TRIG 61 06/23/2017   HDL 70 06/23/2017   CHOLHDL 2.3 06/23/2017   LDLCALC 76 06/23/2017   Lab Results  Component Value Date   TSH 1.030 06/23/2018   TSH 0.904 02/13/2017    Therapeutic Level Labs: No results found for: LITHIUM No results found for: VALPROATE No components found for:  CBMZ  Current Medications: Current Outpatient Medications  Medication Sig Dispense Refill  . amphetamine-dextroamphetamine (ADDERALL) 10 MG tablet Take 1 tablet (10 mg total) by mouth 2 (two) times daily. 60 tablet 0  . amphetamine-dextroamphetamine (ADDERALL) 10 MG tablet Take 1 tablet (10 mg total) by mouth 2 (two) times daily. 60 tablet 0  . amphetamine-dextroamphetamine (ADDERALL) 10 MG tablet Take 1 tablet (10 mg total) by mouth 2 (two) times daily. 60 tablet 0  . etonogestrel (NEXPLANON) 68 MG IMPL implant 1 each by Subdermal route once.     No current facility-administered medications for this visit.      Musculoskeletal: Strength & Muscle Tone: within normal limits Gait & Station: normal Patient leans: N/A  Psychiatric Specialty Exam: Review of Systems  Psychiatric/Behavioral: The patient is nervous/anxious.    All other systems reviewed and are negative.   There were no vitals taken for this visit.There is no height or weight on file to calculate BMI.  General Appearance: Casual  Eye Contact:  Fair  Speech:  Clear and Coherent  Volume:  Normal  Mood:  Anxious improving  Affect:  Congruent  Thought Process:  Goal Directed and Descriptions of Associations: Intact  Orientation:  Full (Time, Place, and Person)  Thought Content: Logical   Suicidal Thoughts:  No  Homicidal Thoughts:  No  Memory:  Immediate;   Fair Recent;   Fair Remote;   Fair  Judgement:  Fair  Insight:  Fair  Psychomotor Activity:  Normal  Concentration:  Concentration: Fair and Attention Span: Fair  Recall:  [employer]  Fund of Knowledge: Fair  Language: Fair  Akathisia:  No  Handed:  Right  AIMS (if indicated): denies tremors, rigidity  Assets:  Communication Skills Desire for Improvement Social Support  ADL's:  Intact  Cognition: WNL  Sleep:  Fair   Screenings: PHQ2-9     [facility]  PHQ-2 Total Score  0  0  PHQ-9 Total Score  0  -       Assessment and Plan: Susan Hoffman is a 28 year old Caucasian female, employed, married, has a history of ADHD was evaluated by telemedicine today.  Patient is currently sick due to food poisoning however is recovering.  Patient otherwise denies any concerns.  Plan as noted below.  Plan ADHD-stable Continue Adderall 10 mg p.o. twice daily I have provided medication education.  She has 1 more prescription pending at the pharmacy.  When she picks it up she will let writer know to send more refills. I have reviewed [place] controlled substance database.  Follow-up in clinic in 3 months or sooner if needed.  Appointment scheduled for September 11 at 10:45 AM.  I have spent atleast 10 minutes non face to face with patient today. More than 50 % of the time was spent for psychoeducation and  supportive psychotherapy and care coordination.  This note was generated in part or whole with voice recognition software. Voice recognition is usually quite accurate but there are transcription errors that can and very often do occur. I apologize for any typographical errors that were not detected and corrected.          Jomarie LongsSaramma Ilani Otterson, MD 09/24/2018, 12:23 PM

## 2018-10-04 ENCOUNTER — Telehealth: Payer: Self-pay

## 2018-10-04 ENCOUNTER — Telehealth: Payer: Self-pay | Admitting: Psychiatry

## 2018-10-04 DIAGNOSIS — F901 Attention-deficit hyperactivity disorder, predominantly hyperactive type: Secondary | ICD-10-CM

## 2018-10-04 NOTE — Telephone Encounter (Signed)
pt called needs refill on her adderall

## 2018-10-05 MED ORDER — AMPHETAMINE-DEXTROAMPHETAMINE 10 MG PO TABS: 10.0000 mg | ORAL_TABLET | Freq: Two times a day (BID) | ORAL | 0 refills | Status: DC

## 2018-10-05 NOTE — Telephone Encounter (Signed)
Sent adderall to pharmacy 

## 2018-10-05 NOTE — Telephone Encounter (Signed)
Sent Adderall with dates specified - last one to be filled on or after 12/27/2018

## 2019-01-07 ENCOUNTER — Ambulatory Visit: Payer: 59 | Admitting: Psychiatry

## 2019-01-18 ENCOUNTER — Telehealth: Payer: 59 | Admitting: Physician Assistant

## 2019-01-18 DIAGNOSIS — R112 Nausea with vomiting, unspecified: Secondary | ICD-10-CM

## 2019-01-18 DIAGNOSIS — M791 Myalgia, unspecified site: Secondary | ICD-10-CM | POA: Diagnosis not present

## 2019-01-18 MED ORDER — ONDANSETRON 4 MG PO TBDP: 4.0000 mg | ORAL_TABLET | Freq: Three times a day (TID) | ORAL | 0 refills | Status: DC | PRN

## 2019-01-18 NOTE — Progress Notes (Addendum)
We are sorry that you are not feeling well. Here is how we plan to help!  Based on what you have shared with me it looks like you may have a viral illness or are experiencing side effects from the flu vaccine.     Vomiting is the forceful emptying of a portion of the stomach's content through the mouth.  Although nausea and vomiting can make you feel miserable, it's important to remember that these are not diseases, but rather symptoms of an underlying illness.  When we treat short term symptoms, we always caution that any symptoms that persist should be fully evaluated in a medical office.  I have prescribed a medication that will help alleviate your symptoms and allow you to stay hydrated:  Zofran 4 mg 1 tablet every 8 hours as needed for nausea and vomiting. Let the medicine dissolve under your tongue and wait around 10 minutes before you have anything to eat or drink to give it time to work.   You can take 1 to 2 tablets of Tylenol (350mg -1000mg  depending on the dose) every 6 hours as needed for headaches or muscle pains.  Do not exceed 4000 mg of Tylenol daily.  If your pain persists you can take a doses of ibuprofen in between doses of Tylenol.  I usually recommend 400 to 600 mg of ibuprofen every 6 hours.  Take this with food to avoid upset stomach issues.   HOME CARE:  Drink clear liquids.  This is very important! Dehydration (the lack of fluid) can lead to a serious complication.  Start off with 1 tablespoon every 5 minutes for 8 hours.  You may begin eating bland foods after 8 hours without vomiting.  Start with saltine crackers, white bread, rice, mashed potatoes, applesauce.  After 48 hours on a bland diet, you may resume a normal diet.  Try to go to sleep.  Sleep often empties the stomach and relieves the need to vomit.  GET HELP RIGHT AWAY IF:   You develop worsening headache, fever, neck stiffness, or vision changes  Develop severe abdominal pain, shortness of breath, or  chest pain  Your symptoms do not improve or worsen within 2 days after treatment.  You have a fever for over 3 days.  You cannot keep down fluids after trying the medication.  MAKE SURE YOU:   Understand these instructions.  Will watch your condition.  Will get help right away if you are not doing well or get worse.   Thank you for choosing an e-visit. Your e-visit answers were reviewed by a board certified advanced clinical practitioner to complete your personal care plan. Depending upon the condition, your plan could have included both over the counter or prescription medications. Please review your pharmacy choice. Be sure that the pharmacy you have chosen is open so that you can pick up your prescription now.  If there is a problem you may message your provider in [place] to have the prescription routed to another pharmacy. Your safety is important to [place]. If you have drug allergies check your prescription carefully.  For the next 24 hours, you can use MyChart to ask questions about today's visit, request a non-urgent call back, or ask for a work or school excuse from your e-visit provider. You will get an e-mail in the next two days asking about your experience. I hope that your e-visit has been valuable and will speed your recovery.  Greater than 5 minutes, yet less than 10 minutes of  time have been spent researching, coordinating, and implementing care for this patient today.

## 2019-01-20 ENCOUNTER — Encounter: Payer: Self-pay | Admitting: Psychiatry

## 2019-01-20 ENCOUNTER — Other Ambulatory Visit: Payer: Self-pay

## 2019-01-20 ENCOUNTER — Ambulatory Visit (INDEPENDENT_AMBULATORY_CARE_PROVIDER_SITE_OTHER): Payer: 59 | Admitting: Psychiatry

## 2019-01-20 DIAGNOSIS — F901 Attention-deficit hyperactivity disorder, predominantly hyperactive type: Secondary | ICD-10-CM | POA: Diagnosis not present

## 2019-01-20 MED ORDER — AMPHETAMINE-DEXTROAMPHETAMINE 10 MG PO TABS: 10.0000 mg | ORAL_TABLET | Freq: Two times a day (BID) | ORAL | 0 refills | Status: DC

## 2019-01-20 NOTE — Progress Notes (Signed)
Virtual Visit via Video Note  I connected with Susan Hoffman on 01/20/19 at  8:30 AM EDT by a video enabled telemedicine application and verified that I am speaking with the correct person using two identifiers.   I discussed the limitations of evaluation and management by telemedicine and the availability of in person appointments. The patient expressed understanding and agreed to proceed.   I discussed the assessment and treatment plan with the patient. The patient was provided an opportunity to ask questions and all were answered. The patient agreed with the plan and demonstrated an understanding of the instructions.   The patient was advised to call back or seek an in-person evaluation if the symptoms worsen or if the condition fails to improve as anticipated.   [place] MD OP Progress Note  01/20/2019 12:53 PM [name]  MRN:  [id number]  Chief Complaint:  Chief Complaint    Follow-up     HPI: Susan Hoffman is a 28 year old Caucasian female, employed, married, lives in [place], was evaluated by telemedicine today.  She has a history of ADHD.  Patient reports she recently has flulike symptoms since she got her flu shots.  She hence is taking a break from work at this time.  She otherwise reports she is compliant on Adderall.  She denies any side effects.  She reports sleep and appetite is fair.  Patient denies any suicidality, homicidality or perceptual disturbances.  Patient denies any other concerns today. Visit Diagnosis:    ICD-10-CM   1. Attention deficit hyperactivity disorder (ADHD), predominantly hyperactive type  F90.1 amphetamine-dextroamphetamine (ADDERALL) 10 MG tablet    amphetamine-dextroamphetamine (ADDERALL) 10 MG tablet    amphetamine-dextroamphetamine (ADDERALL) 10 MG tablet    Past Psychiatric History: I have reviewed past psychiatric history from my progress note on 05/08/2017  Past Medical History:  Past Medical History:  Diagnosis Date  .  Abnormal cells of cervix 2016   Was told needs PAP every 6 months due to abnormal cells.   . Acquired deafness of right ear 2015  . ADHD     Past Surgical History:  Procedure Laterality Date  . nexplanon  2017  . TYMPANOPLASTY    . wisdom teeth removal      Family Psychiatric History: I have reviewed family psychiatric history from my progress note on 05/08/2017  Family History:  Family History  Problem Relation Age of Onset  . Hypertension Father   . Hyperthyroidism Father   . Lung cancer Maternal Grandmother   . Lung cancer Paternal Grandmother   . Lung cancer Paternal Grandfather   . Cancer Maternal Aunt   . Ovarian cancer Neg Hx   . AAA (abdominal aortic aneurysm) Neg Hx   . Colon cancer Neg Hx   . Diabetes Neg Hx     Social History: I have reviewed social history from my progress note on 05/08/2017 Social History   Socioeconomic History  . Marital status: Married    Spouse name: Susan Hoffman  . Number of children: 0  . Years of education: Not on file  . Highest education level: Doctorate  Occupational History    Comment: full time  Social Needs  . Financial resource strain: Not hard at all  . Food insecurity    Worry: Never true    Inability: Never true  . Transportation needs    Medical: No    Non-medical: No  Tobacco Use  . Smoking status: Never Smoker  . Smokeless tobacco: Never Used  Substance and Sexual  Activity  . Alcohol use: Yes    Alcohol/week: 2.0 standard drinks    Types: 1 Glasses of wine, 1 Cans of beer per week    Comment: wine weekly  . Drug use: No  . Sexual activity: Yes    Birth control/protection: Implant  Lifestyle  . Physical activity    Days per week: 4 days    Minutes per session: 60 min  . Stress: Only a little  Relationships  . Social connections    Talks on phone: More than three times a week    Gets together: Three times a week    Attends religious service: Never    Active member of club or organization: Yes    Attends  meetings of clubs or organizations: More than 4 times per year    Relationship status: Married  Other Topics Concern  . Not on file  Social History Narrative  . Not on file    Allergies:  Allergies  Allergen Reactions  . Codeine   . Mushroom Extract Complex     Metabolic Disorder Labs: No results found for: HGBA1C, MPG No results found for: PROLACTIN Lab Results  Component Value Date   CHOL 158 06/23/2017   TRIG 61 06/23/2017   HDL 70 06/23/2017   CHOLHDL 2.3 06/23/2017   LDLCALC 76 06/23/2017   Lab Results  Component Value Date   TSH 1.030 06/23/2018   TSH 0.904 02/13/2017    Therapeutic Level Labs: No results found for: LITHIUM No results found for: VALPROATE No components found for:  CBMZ  Current Medications: Current Outpatient Medications  Medication Sig Dispense Refill  . [START ON 02/08/2019] amphetamine-dextroamphetamine (ADDERALL) 10 MG tablet Take 1 tablet (10 mg total) by mouth 2 (two) times daily. 60 tablet 0  . [START ON 03/09/2019] amphetamine-dextroamphetamine (ADDERALL) 10 MG tablet Take 1 tablet (10 mg total) by mouth 2 (two) times daily. 60 tablet 0  . [START ON 04/07/2019] amphetamine-dextroamphetamine (ADDERALL) 10 MG tablet Take 1 tablet (10 mg total) by mouth 2 (two) times daily. 60 tablet 0  . etonogestrel (NEXPLANON) 68 MG IMPL implant 1 each by Subdermal route once.    . ondansetron (ZOFRAN ODT) 4 MG disintegrating tablet Take 1 tablet (4 mg total) by mouth every 8 (eight) hours as needed for nausea or vomiting. 10 tablet 0   No current facility-administered medications for this visit.      Musculoskeletal: Strength & Muscle Tone: UTA Gait & Station: Observed as seated Patient leans: N/A  Psychiatric Specialty Exam: Review of Systems  Constitutional: Positive for chills.  Gastrointestinal: Positive for nausea.  Musculoskeletal: Positive for myalgias.  All other systems reviewed and are negative.   There were no vitals taken for  this visit.There is no height or weight on file to calculate BMI.  General Appearance: Casual  Eye Contact:  Fair  Speech:  Normal Rate  Volume:  Normal  Mood:  Euthymic  Affect:  Congruent  Thought Process:  Goal Directed and Descriptions of Associations: Intact  Orientation:  Full (Time, Place, and Person)  Thought Content: Logical   Suicidal Thoughts:  No  Homicidal Thoughts:  No  Memory:  Immediate;   Fair Recent;   Fair Remote;   Fair  Judgement:  Fair  Insight:  Fair  Psychomotor Activity:  Normal  Concentration:  Concentration: Fair and Attention Span: Fair  Recall:  [employer] of Knowledge: Fair  Language: Fair  Akathisia:  No  Handed:  Right  AIMS (if  indicated): denies tremors, rigidity  Assets:  Communication Skills Desire for Improvement Social Support  ADL's:  Intact  Cognition: WNL  Sleep:  Fair   Screenings: PHQ2-9     Office Visit from 06/23/2018 in [facility]  PHQ-2 Total Score  0  0  PHQ-9 Total Score  0  -       Assessment and Plan: Susan Hoffman is a 28 year old Caucasian female, employed, married, has a history of ADHD was evaluated by telemedicine today.  Patient is currently doing well except for flulike symptoms after she got her flu shot.  Plan as noted below.  Plan ADHD-stable Continue Adderall 10 mg p.o. twice daily. We will provide her 3 prescriptions with date specified last 1 to be filled on or after 04/07/2019 I have reviewed [place] controlled substance database.  Follow-up in clinic in 3 months or sooner if needed.  January 7 at 8:30 AM  I have spent atleast 10 minutes non  face to face with patient today. More than 50 % of the time was spent for psychoeducation and supportive psychotherapy and care coordination. This note was generated in part or whole with voice recognition software. Voice recognition is usually quite accurate but there are transcription errors that can and very  often do occur. I apologize for any typographical errors that were not detected and corrected.         Jomarie LongsSaramma Makeda Peeks, MD 01/20/2019, 12:53 PM

## 2019-02-11 DIAGNOSIS — H5211 Myopia, right eye: Secondary | ICD-10-CM | POA: Diagnosis not present

## 2019-05-05 ENCOUNTER — Ambulatory Visit: Payer: 59 | Admitting: Psychiatry

## 2019-05-05 ENCOUNTER — Ambulatory Visit: Payer: 59 | Attending: Internal Medicine

## 2019-05-05 DIAGNOSIS — Z20822 Contact with and (suspected) exposure to covid-19: Secondary | ICD-10-CM

## 2019-05-07 LAB — NOVEL CORONAVIRUS, NAA: SARS-CoV-2, NAA: NOT DETECTED

## 2019-05-19 ENCOUNTER — Telehealth: Payer: 59 | Admitting: Physician Assistant

## 2019-05-19 DIAGNOSIS — T7840XA Allergy, unspecified, initial encounter: Secondary | ICD-10-CM

## 2019-05-19 NOTE — Progress Notes (Signed)
I am sorry that you are not feeling well.  Given your persistent symptoms after vaccine I feel it important that you be evaluated with a face-to-face visit.  Based on what you shared with me, I feel your condition warrants further evaluation and I recommend that you be seen for a face to face office visit.   NOTE: If you entered your credit card information for this eVisit, you will not be charged. You may see a "hold" on your card for the $35 but that hold will drop off and you will not have a charge processed.   If you are having a true medical emergency please call 911.      For an urgent face to face visit, [facility] has five urgent care centers for your convenience:      NEW:  [facility] at [facility] Directions [phone] [address] [place], [place] [address] . 10 am - 6pm Monday - Friday    [facility]) Get Driving Directions [phone] [address] [place], [place] [address] . 10 am to 8 pm Monday-Friday . 12 pm to 8 pm [facility] at [facility] Get Driving Directions [phone] [address] [place], [place] [address] . 8 am to 8 pm Monday-Friday . 9 am to 6 pm Saturday . 11 am to 6 pm Sunday     [facility] at [facility] Get Driving Directions  [phone] [address] [place], [place] [address] . 8 am to 8 pm Monday-Friday . 8 am to 4 pm [facility] at [facility] Directions [phone] [address] [place], [place] [address] . 12 pm to 6 pm Monday-Friday      Your e-visit answers were reviewed by a board certified advanced clinical practitioner to complete your personal care plan.  Thank you for using e-Visits.   Greater than 5 minutes, yet less than 10 minutes of time have been spent researching, coordinating, and implementing  care for this patient today

## 2019-06-03 ENCOUNTER — Encounter: Payer: Self-pay | Admitting: Psychiatry

## 2019-06-03 ENCOUNTER — Ambulatory Visit (INDEPENDENT_AMBULATORY_CARE_PROVIDER_SITE_OTHER): Payer: 59 | Admitting: Psychiatry

## 2019-06-03 ENCOUNTER — Other Ambulatory Visit: Payer: Self-pay

## 2019-06-03 DIAGNOSIS — F901 Attention-deficit hyperactivity disorder, predominantly hyperactive type: Secondary | ICD-10-CM | POA: Diagnosis not present

## 2019-06-03 MED ORDER — AMPHETAMINE-DEXTROAMPHETAMINE 10 MG PO TABS: 10.0000 mg | ORAL_TABLET | Freq: Two times a day (BID) | ORAL | 0 refills | Status: DC

## 2019-06-03 NOTE — Progress Notes (Signed)
Provider Location : [facility] Patient Location : Home  Virtual Visit via Video Note  I connected with Susan Hoffman on 06/03/19 at  8:30 AM EST by a video enabled telemedicine application and verified that I am speaking with the correct person using two identifiers.   I discussed the limitations of evaluation and management by telemedicine and the availability of in person appointments. The patient expressed understanding and agreed to proceed.    I discussed the assessment and treatment plan with the patient. The patient was provided an opportunity to ask questions and all were answered. The patient agreed with the plan and demonstrated an understanding of the instructions.   The patient was advised to call back or seek an in-person evaluation if the symptoms worsen or if the condition fails to improve as anticipated.   [facility] MD OP Progress Note  06/03/2019 8:37 AM Susan Hoffman  MRN:  [id number]  Chief Complaint:  Chief Complaint    Follow-up     HPI: Susan Hoffman is a 29 year old Caucasian female, employed, married, lives in [place] was evaluated by telemedicine today.  Patient today reports she is currently doing well on the Adderall.  She denies any side effects.  She she denies depression, anxiety or sleep problems.  She reports appetite is good.  She denies suicidality, homicidality or perceptual disturbances.  She reports she recently got her COVID-19 vaccination and developed severe reaction to it.  She reports she had swelling of her throat and tongue which lasted for a week or so.  She reports she had to take Benadryl several times a day and she slept through it all. She currently feels better.  She denies any other concerns today. Visit Diagnosis:    ICD-10-CM   1. Attention deficit hyperactivity disorder (ADHD), predominantly hyperactive type  F90.1 amphetamine-dextroamphetamine (ADDERALL) 10 MG tablet    amphetamine-dextroamphetamine (ADDERALL) 10 MG tablet     amphetamine-dextroamphetamine (ADDERALL) 10 MG tablet    Past Psychiatric History: Reviewed past psychiatric history from my progress note on 05/08/2017.  Past Medical History:  Past Medical History:  Diagnosis Date  . Abnormal cells of cervix 2016   Was told needs PAP every 6 months due to abnormal cells.   . Acquired deafness of right ear 2015  . ADHD     Past Surgical History:  Procedure Laterality Date  . nexplanon  2017  . TYMPANOPLASTY    . wisdom teeth removal      Family Psychiatric History: Reviewed family psychiatric history from my progress note on 05/08/2017.  Family History:  Family History  Problem Relation Age of Onset  . Hypertension Father   . Hyperthyroidism Father   . Lung cancer Maternal Grandmother   . Lung cancer Paternal Grandmother   . Lung cancer Paternal Grandfather   . Cancer Maternal Aunt   . Ovarian cancer Neg Hx   . AAA (abdominal aortic aneurysm) Neg Hx   . Colon cancer Neg Hx   . Diabetes Neg Hx     Social History: Reviewed social history from my progress note on 05/08/2017. Social History   Socioeconomic History  . Marital status: Married    Spouse name: jonathan  . Number of children: 0  . Years of education: Not on file  . Highest education level: Doctorate  Occupational History    Comment: full time  Tobacco Use  . Smoking status: Never Smoker  . Smokeless tobacco: Never Used  Substance and Sexual Activity  . Alcohol use: Yes  Alcohol/week: 2.0 standard drinks    Types: 1 Glasses of wine, 1 Cans of beer per week    Comment: wine weekly  . Drug use: No  . Sexual activity: Yes    Birth control/protection: Implant  Other Topics Concern  . Not on file  Social History Narrative  . Not on file   Social Determinants of Health   Financial Resource Strain:   . Difficulty of Paying Living Expenses: Not on file  Food Insecurity:   . Worried About Charity fundraiser in the Last Year: Not on file  . Ran Out of Food in the  Last Year: Not on file  Transportation Needs:   . Lack of Transportation (Medical): Not on file  . Lack of Transportation (Non-Medical): Not on file  Physical Activity:   . Days of Exercise per Week: Not on file  . Minutes of Exercise per Session: Not on file  Stress:   . Feeling of Stress : Not on file  Social Connections:   . Frequency of Communication with Friends and Family: Not on file  . Frequency of Social Gatherings with Friends and Family: Not on file  . Attends Religious Services: Not on file  . Active Member of Clubs or Organizations: Not on file  . Attends Archivist Meetings: Not on file  . Marital Status: Not on file    Allergies:  Allergies  Allergen Reactions  . Codeine   . Mushroom Extract Complex     Metabolic Disorder Labs: No results found for: HGBA1C, MPG No results found for: PROLACTIN Lab Results  Component Value Date   CHOL 158 06/23/2017   TRIG 61 06/23/2017   HDL 70 06/23/2017   CHOLHDL 2.3 06/23/2017   LDLCALC 76 06/23/2017   Lab Results  Component Value Date   TSH 1.030 06/23/2018   TSH 0.904 02/13/2017    Therapeutic Level Labs: No results found for: LITHIUM No results found for: VALPROATE No components found for:  CBMZ  Current Medications: Current Outpatient Medications  Medication Sig Dispense Refill  . amphetamine-dextroamphetamine (ADDERALL) 10 MG tablet Take 1 tablet (10 mg total) by mouth 2 (two) times daily. 60 tablet 0  . [START ON 07/03/2019] amphetamine-dextroamphetamine (ADDERALL) 10 MG tablet Take 1 tablet (10 mg total) by mouth 2 (two) times daily. 60 tablet 0  . [START ON 08/01/2019] amphetamine-dextroamphetamine (ADDERALL) 10 MG tablet Take 1 tablet (10 mg total) by mouth 2 (two) times daily. 60 tablet 0  . etonogestrel (NEXPLANON) 68 MG IMPL implant 1 each by Subdermal route once.    . ondansetron (ZOFRAN ODT) 4 MG disintegrating tablet Take 1 tablet (4 mg total) by mouth every 8 (eight) hours as needed for  nausea or vomiting. 10 tablet 0   No current facility-administered medications for this visit.     Musculoskeletal: Strength & Muscle Tone: UTA Gait & Station: normal Patient leans: N/A  Psychiatric Specialty Exam: Review of Systems  Psychiatric/Behavioral: Negative for agitation, behavioral problems, confusion, decreased concentration, dysphoric mood, hallucinations, self-injury, sleep disturbance and suicidal ideas. The patient is not nervous/anxious and is not hyperactive.   All other systems reviewed and are negative.   There were no vitals taken for this visit.There is no height or weight on file to calculate BMI.  General Appearance: Casual  Eye Contact:  Fair  Speech:  Clear and Coherent  Volume:  Normal  Mood:  Euthymic  Affect:  Congruent  Thought Process:  Goal Directed and Descriptions of Associations:  Intact  Orientation:  Full (Time, Place, and Person)  Thought Content: Logical   Suicidal Thoughts:  No  Homicidal Thoughts:  No  Memory:  Immediate;   Fair Recent;   Fair Remote;   Fair  Judgement:  Fair  Insight:  Fair  Psychomotor Activity:  Normal  Concentration:  Concentration: Fair and Attention Span: Fair  Recall:  [employer] of Knowledge: Fair  Language: Fair  Akathisia:  No  Handed:  Right  AIMS (if indicated):Denies tremors, rigidity  Assets:  Communication Skills Desire for Improvement Housing Social Support  ADL's:  Intact  Cognition: WNL  Sleep:  Fair   Screenings: PHQ2-9     [facility] Visit from 06/23/2018 in [facility] Office Visit from 02/13/2017 in [facility]  PHQ-2 Total Score  0  0  PHQ-9 Total Score  0  --       Assessment and Plan: Helia is a 29 year old Caucasian female, employed, married, has a history of ADHD was evaluated by telemedicine today.  Patient is currently doing well on current medication regimen.  Plan as noted below.  Plan ADHD-stable Adderall 10 mg p.o. twice daily I have provided her with  3 prescriptions with date specified last 1 to be filled on or after-August 01, 2019. I have reviewed [place] controlled substance database.  Follow-up in clinic in 3 months or sooner if needed.  April 30 at 8:30 AM  I have spent atleast 15 minutes non face to face with patient today. More than 50 % of the time was spent for preparing to see the patient ( e.g., review of test, records ), ordering medications and test ,psychoeducation and supportive psychotherapy and care coordination,as well as documenting clinical information in electronic health record. This note was generated in part or whole with voice recognition software. Voice recognition is usually quite accurate but there are transcription errors that can and very often do occur. I apologize for any typographical errors that were not detected and corrected.       Jomarie Longs, MD 06/03/2019, 8:37 AM

## 2019-06-06 ENCOUNTER — Encounter: Payer: Self-pay | Admitting: Internal Medicine

## 2019-06-06 ENCOUNTER — Other Ambulatory Visit: Payer: Self-pay

## 2019-06-06 DIAGNOSIS — L309 Dermatitis, unspecified: Secondary | ICD-10-CM

## 2019-06-17 ENCOUNTER — Encounter: Payer: Self-pay | Admitting: Internal Medicine

## 2019-06-25 ENCOUNTER — Other Ambulatory Visit: Payer: Self-pay | Admitting: Internal Medicine

## 2019-06-27 ENCOUNTER — Other Ambulatory Visit: Payer: Self-pay

## 2019-06-27 ENCOUNTER — Ambulatory Visit (INDEPENDENT_AMBULATORY_CARE_PROVIDER_SITE_OTHER): Payer: 59 | Admitting: Internal Medicine

## 2019-06-27 ENCOUNTER — Encounter: Payer: Self-pay | Admitting: Internal Medicine

## 2019-06-27 VITALS — BP 96/58 | HR 71 | Temp 97.9°F | Ht 64.0 in | Wt 140.4 lb

## 2019-06-27 DIAGNOSIS — L309 Dermatitis, unspecified: Secondary | ICD-10-CM

## 2019-06-27 DIAGNOSIS — F901 Attention-deficit hyperactivity disorder, predominantly hyperactive type: Secondary | ICD-10-CM | POA: Diagnosis not present

## 2019-06-27 DIAGNOSIS — Z0000 Encounter for general adult medical examination without abnormal findings: Secondary | ICD-10-CM | POA: Diagnosis not present

## 2019-06-27 LAB — POCT URINALYSIS DIPSTICK
Bilirubin, UA: NEGATIVE
Blood, UA: NEGATIVE
Glucose, UA: NEGATIVE
Ketones, UA: NEGATIVE
Leukocytes, UA: NEGATIVE
Nitrite, UA: NEGATIVE
Protein, UA: NEGATIVE
Spec Grav, UA: <=1.005 — AB (ref 1.010–1.025)
Urobilinogen, UA: 0.2 E.U./dL
pH, UA: 6 (ref 5.0–8.0)

## 2019-06-27 NOTE — Progress Notes (Signed)
Date:  06/27/2019   Name:  Susan Hoffman   DOB:  [birth year]   MRN:  [id number]   Chief Complaint: Annual Exam (Breast Exam. Next pap- 2024.) Susan Hoffman is a 29 y.o. female who presents today for her Complete Annual Exam. She feels fairly well. She reports exercising regularly. She reports she is sleeping well. She denies breast issues.  She has Nexplanon for contraception and is doing well - only has bleeding about once a year.  She believes it was placed in 2019. She had a moderately severe reaction to her second covid vaccine with throat swelling. It was managed by benadryl but she was out of work for a week.  Feels normal now with only some mild persistent enlarged lymph nodes.  Pap - 2019 thin prep  Immunization History  Administered Date(s) Administered  . Influenza-Unspecified 02/10/2018, 02/15/2019  . PFIZER SARS-COV-2 Vaccination 04/29/2019, 05/19/2019  . Tdap 06/23/2018    HPI  Lab Results  Component Value Date   CREATININE 0.95 06/23/2018   BUN 7 06/23/2018   NA 139 06/23/2018   K 4.4 06/23/2018   CL 105 06/23/2018   CO2 21 06/23/2018   Lab Results  Component Value Date   CHOL 158 06/23/2017   HDL 70 06/23/2017   LDLCALC 76 06/23/2017   TRIG 61 06/23/2017   CHOLHDL 2.3 06/23/2017   Lab Results  Component Value Date   TSH 1.030 06/23/2018   No results found for: HGBA1C   Review of Systems  Constitutional: Negative for chills, fatigue and fever.  HENT: Positive for hearing loss. Negative for congestion, tinnitus, trouble swallowing and voice change.   Eyes: Negative for visual disturbance.  Respiratory: Negative for cough, chest tightness, shortness of breath and wheezing.   Cardiovascular: Negative for chest pain, palpitations and leg swelling.  Gastrointestinal: Negative for abdominal pain, constipation, diarrhea and vomiting.  Endocrine: Negative for polydipsia and polyuria.  Genitourinary: Positive for frequency. Negative for  dysuria, genital sores, menstrual problem, vaginal bleeding and vaginal discharge.  Musculoskeletal: Negative for arthralgias, gait problem and joint swelling.  Skin: Negative for color change and rash.  Allergic/Immunologic: Negative for environmental allergies.  Neurological: Negative for dizziness, tremors, light-headedness and headaches.  Hematological: Negative for adenopathy. Does not bruise/bleed easily.  Psychiatric/Behavioral: Negative for dysphoric mood and sleep disturbance. The patient is not nervous/anxious.     Patient Active Problem List   Diagnosis Date Noted  . Abnormal Pap smear of cervix 02/13/2017  . Attention deficit hyperactivity disorder (ADHD), predominantly hyperactive type 02/13/2017  . Dermatitis 02/13/2017  . 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 Codeine   . Mushroom Extract Complex     Past Surgical History:  Procedure Laterality Date  . nexplanon  2017  . TYMPANOPLASTY    . wisdom teeth removal      Social History   Tobacco Use  . Smoking status: Never Smoker  . Smokeless tobacco: Never Used  Substance Use Topics  . Alcohol use: Yes    Alcohol/week: 2.0 standard drinks    Types: 1 Glasses of wine, 1 Cans of beer per week    Comment: wine weekly  . Drug use: No     Medication list has been reviewed and updated.  Current Meds  Medication Sig  . [START ON 08/01/2019] amphetamine-dextroamphetamine (ADDERALL) 10 MG tablet Take 1 tablet (10 mg total) by mouth 2 (two) times daily.  Marland Kitchen etonogestrel (NEXPLANON) 68 MG IMPL  implant 1 each by Subdermal route once.    PHQ 2/9 Scores 06/27/2019 06/23/2018 02/13/2017  PHQ - 2 Score 0 0 0  PHQ- 9 Score 3 0 -    BP Readings from Last 3 Encounters:  06/27/19 (!) 96/58  06/23/18 119/78  06/15/18 100/80    Physical Exam Vitals and nursing note reviewed.  Constitutional:      General: She is not in acute distress.    Appearance: She is well-developed.  HENT:      Head: Normocephalic and atraumatic.     Right Ear: Tympanic membrane and ear canal normal.     Left Ear: Tympanic membrane and ear canal normal.     Nose:     Right Sinus: No maxillary sinus tenderness.     Left Sinus: No maxillary sinus tenderness.  Eyes:     General: No scleral icterus.       Right eye: No discharge.        Left eye: No discharge.     Conjunctiva/sclera: Conjunctivae normal.  Neck:     Thyroid: No thyromegaly.     Vascular: No carotid bruit.  Cardiovascular:     Rate and Rhythm: Normal rate and regular rhythm.     Pulses: Normal pulses.     Heart sounds: Normal heart sounds.  Pulmonary:     Effort: Pulmonary effort is normal. No respiratory distress.     Breath sounds: No wheezing.  Chest:     Breasts:        Right: No mass, nipple discharge, skin change or tenderness.        Left: No mass, nipple discharge, skin change or tenderness.  Abdominal:     General: Bowel sounds are normal.     Palpations: Abdomen is soft.     Tenderness: There is no abdominal tenderness.  Musculoskeletal:        General: Normal range of motion.     Cervical back: Normal range of motion. No erythema.     Right lower leg: No edema.     Left lower leg: No edema.  Lymphadenopathy:     Cervical: No cervical adenopathy.  Skin:    General: Skin is warm and dry.     Capillary Refill: Capillary refill takes less than 2 seconds.     Findings: No rash.  Neurological:     General: No focal deficit present.     Mental Status: She is alert and oriented to person, place, and time.     Cranial Nerves: No cranial nerve deficit.     Sensory: No sensory deficit.     Deep Tendon Reflexes: Reflexes are normal and symmetric.  Psychiatric:        Speech: Speech normal.        Behavior: Behavior normal.        Thought Content: Thought content normal.     Wt Readings from Last 3 Encounters:  06/27/19 140 lb 6.4 oz (63.7 kg)  06/23/18 138 lb (62.6 kg)  06/15/18 139 lb (63 kg)    BP (!)  96/58   Pulse 71   Temp 97.9 F (36.6 C) (Oral)   Ht 5\' 4"  (1.626 m)   Wt 140 lb 6.4 oz (63.7 kg)   SpO2 98%   BMI 24.10 kg/m   Assessment and Plan: 1. Annual physical exam Normal exam Continue healthy diet, regular exercise Pap due next year - CBC with Differential/Platelet - Comprehensive metabolic panel - POCT urinalysis dipstick - TSH -  Lipid panel  2. Attention deficit hyperactivity disorder (ADHD), predominantly hyperactive type Followed by Psych - on Adderall  3. Dermatitis Upcoming appointment with Dermatology tomorrow   Partially dictated using Dragon software. Any errors are unintentional.  Bari Edward, MD [facility]  06/27/2019

## 2019-06-28 DIAGNOSIS — L300 Nummular dermatitis: Secondary | ICD-10-CM | POA: Diagnosis not present

## 2019-06-28 LAB — COMPREHENSIVE METABOLIC PANEL
ALT: 10 IU/L (ref 0–32)
AST: 16 IU/L (ref 0–40)
Albumin/Globulin Ratio: 2 (ref 1.2–2.2)
Albumin: 4.3 g/dL (ref 3.9–5.0)
Alkaline Phosphatase: 60 IU/L (ref 39–117)
BUN/Creatinine Ratio: 9 (ref 9–23)
BUN: 9 mg/dL (ref 6–20)
Bilirubin Total: 0.5 mg/dL (ref 0.0–1.2)
CO2: 22 mmol/L (ref 20–29)
Calcium: 9.9 mg/dL (ref 8.7–10.2)
Chloride: 103 mmol/L (ref 96–106)
Creatinine, Ser: 0.98 mg/dL (ref 0.57–1.00)
GFR calc Af Amer: 91 mL/min/{1.73_m2} (ref >59)
GFR calc non Af Amer: 79 mL/min/{1.73_m2} (ref >59)
Globulin, Total: 2.1 g/dL (ref 1.5–4.5)
Glucose: 81 mg/dL (ref 65–99)
Potassium: 4.9 mmol/L (ref 3.5–5.2)
Sodium: 138 mmol/L (ref 134–144)
Total Protein: 6.4 g/dL (ref 6.0–8.5)

## 2019-06-28 LAB — CBC WITH DIFFERENTIAL/PLATELET
Basophils Absolute: 0 10*3/uL (ref 0.0–0.2)
Basos: 1 %
EOS (ABSOLUTE): 0.1 10*3/uL (ref 0.0–0.4)
Eos: 2 %
Hematocrit: 44.7 % (ref 34.0–46.6)
Hemoglobin: 14.6 g/dL (ref 11.1–15.9)
Immature Grans (Abs): 0 10*3/uL (ref 0.0–0.1)
Immature Granulocytes: 0 %
Lymphocytes Absolute: 2.4 10*3/uL (ref 0.7–3.1)
Lymphs: 34 %
MCH: 30.6 pg (ref 26.6–33.0)
MCHC: 32.7 g/dL (ref 31.5–35.7)
MCV: 94 fL (ref 79–97)
Monocytes Absolute: 0.4 10*3/uL (ref 0.1–0.9)
Monocytes: 6 %
Neutrophils Absolute: 4 10*3/uL (ref 1.4–7.0)
Neutrophils: 57 %
Platelets: 356 10*3/uL (ref 150–450)
RBC: 4.77 x10E6/uL (ref 3.77–5.28)
RDW: 12.2 % (ref 11.7–15.4)
WBC: 7 10*3/uL (ref 3.4–10.8)

## 2019-06-28 LAB — TSH: TSH: 0.889 u[IU]/mL (ref 0.450–4.500)

## 2019-06-28 LAB — LIPID PANEL
Chol/HDL Ratio: 2.1 ratio (ref 0.0–4.4)
Cholesterol, Total: 147 mg/dL (ref 100–199)
HDL: 70 mg/dL (ref >39)
LDL Chol Calc (NIH): 69 mg/dL (ref 0–99)
Triglycerides: 31 mg/dL (ref 0–149)
VLDL Cholesterol Cal: 8 mg/dL (ref 5–40)

## 2019-07-07 ENCOUNTER — Encounter: Payer: Self-pay | Admitting: Internal Medicine

## 2019-08-26 ENCOUNTER — Other Ambulatory Visit: Payer: Self-pay

## 2019-08-26 ENCOUNTER — Telehealth (INDEPENDENT_AMBULATORY_CARE_PROVIDER_SITE_OTHER): Payer: 59 | Admitting: Psychiatry

## 2019-08-26 ENCOUNTER — Encounter: Payer: Self-pay | Admitting: Psychiatry

## 2019-08-26 DIAGNOSIS — F419 Anxiety disorder, unspecified: Secondary | ICD-10-CM

## 2019-08-26 DIAGNOSIS — F901 Attention-deficit hyperactivity disorder, predominantly hyperactive type: Secondary | ICD-10-CM | POA: Diagnosis not present

## 2019-08-26 MED ORDER — AMPHETAMINE-DEXTROAMPHETAMINE 10 MG PO TABS: 10.0000 mg | ORAL_TABLET | Freq: Two times a day (BID) | ORAL | 0 refills | Status: DC
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
## 2019-09-09 ENCOUNTER — Encounter: Payer: Self-pay | Admitting: Internal Medicine

## 2019-09-09 ENCOUNTER — Other Ambulatory Visit: Payer: Self-pay

## 2019-09-09 ENCOUNTER — Telehealth: Payer: Self-pay

## 2019-09-09 ENCOUNTER — Telehealth: Payer: Self-pay | Admitting: Psychiatry

## 2019-09-09 ENCOUNTER — Ambulatory Visit
Admission: RE | Admit: 2019-09-09 | Discharge: 2019-09-09 | Disposition: A | Discharge status: Home / Self Care | Payer: 59 | Attending: Internal Medicine | Admitting: Internal Medicine

## 2019-09-09 ENCOUNTER — Ambulatory Visit
Admission: RE | Admit: 2019-09-09 | Discharge: 2019-09-09 | Disposition: A | Discharge status: Home / Self Care | Payer: 59 | Source: Ambulatory Visit | Attending: Internal Medicine | Admitting: Internal Medicine

## 2019-09-09 DIAGNOSIS — M25571 Pain in right ankle and joints of right foot: Secondary | ICD-10-CM | POA: Diagnosis not present

## 2019-09-09 DIAGNOSIS — W19XXXA Unspecified fall, initial encounter: Secondary | ICD-10-CM | POA: Diagnosis not present

## 2019-09-09 DIAGNOSIS — F419 Anxiety disorder, unspecified: Secondary | ICD-10-CM

## 2019-09-09 DIAGNOSIS — F901 Attention-deficit hyperactivity disorder, predominantly hyperactive type: Secondary | ICD-10-CM

## 2019-09-09 NOTE — Telephone Encounter (Signed)
Attempted to contact patient back.  Left voicemail.

## 2019-09-09 NOTE — Telephone Encounter (Signed)
pt called she states she seen you in april and that when she went into her my chart she now has a dx; of anxiety and she wants to know why that is on there.  she doesnt want it on her chart.

## 2019-09-14 ENCOUNTER — Other Ambulatory Visit: Payer: Self-pay

## 2019-09-14 DIAGNOSIS — W19XXXA Unspecified fall, initial encounter: Secondary | ICD-10-CM

## 2019-09-14 DIAGNOSIS — M25571 Pain in right ankle and joints of right foot: Secondary | ICD-10-CM

## 2019-10-11 ENCOUNTER — Other Ambulatory Visit: Payer: Self-pay

## 2019-10-11 ENCOUNTER — Encounter: Payer: Self-pay | Admitting: Sports Medicine

## 2019-10-11 ENCOUNTER — Ambulatory Visit (INDEPENDENT_AMBULATORY_CARE_PROVIDER_SITE_OTHER): Payer: 59 | Admitting: Sports Medicine

## 2019-10-11 ENCOUNTER — Ambulatory Visit: Payer: Self-pay

## 2019-10-11 VITALS — BP 120/60 | Ht 62.0 in | Wt 138.0 lb

## 2019-10-11 DIAGNOSIS — M25571 Pain in right ankle and joints of right foot: Secondary | ICD-10-CM | POA: Diagnosis not present

## 2019-10-11 NOTE — Assessment & Plan Note (Signed)
Patient with acute right ankle pain following a fall.  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  Patient does have increased laxity with inversion and ultrasound imaging did show right ATF tear with avulsion fracture.  This explains patient's symptoms and pain.  Advised to continue patient's home physical therapy.  Advised 1 foot balance exercises as well as standard reach exercise.  Also advised gentle dorsiflexion as well.  Patient is an active person and play soccer.  Advised that once patient has good balance with 1 foot balance exercises she may start to play soccer again.  Also advised orthotic use especially given her bunions.  Patient should continue to use bracing for stability.  Has several braces at home and will offer BIOflex braces as well.  Follow-up as needed with Dr. Darrick Penna.

## 2019-10-11 NOTE — Progress Notes (Signed)
Susan Hoffman is a 29 y.o. female who presents to [facility] today for the following:  Right ankle pain Patient presenting as new patient for right ankle pain.  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  Since then she has had right ankle pain.  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  Initially was having pain on and off but for the past 2 months has been having progressively worsening pain.  States it is more of an annoyance pain since it happens daily and interferes with work.  States that her ankle feels unstable so she has to wear a brace daily which is difficult at work.  Does report edema to her lateral ankle especially at the end of the day or after a long day of work.  Is trying rice therapy and the ice does help with swelling.  Used to play adult league soccer and run but she cannot do these secondary to pain and instability now.  States in the past she has always had left-sided ankle sprains and fractures but never on her right side.  PMH reviewed. None pertinent  ROS as above. Medications reviewed.  Exam:  BP 120/60   Ht 5\' 2"  (1.575 m)   Wt 138 lb (62.6 kg)   BMI 25.24 kg/m  Gen: Well NAD MSK: Ankle: - Inspection: No obvious deformity, erythema, swelling, or ecchymosis, ulcers, calluses, blisters - Palpation: No TTP at MT heads, no TTP at base of 5th MT, no TTP over cuboid, no tenderness over navicular prominence, no TTP over lateral or medial malleolus. TTP of peroneal tendons and ATFL.  - Strength: Normal strength with dorsiflexion, plantarflexion, inversion, and eversion of foot but pain with inversion. Normal flexion and extension of toes - ROM: Full ROM - Neuro/vasc: NV intact - Special Tests: Negative anterior drawer, increased laxity with inversion test.  Negative syndesmotic compression.  Feet: bilateral bunions noted with right lateral  bunionette. flattened longitudinal arch bilat. With resting mild pronation.  MSK RT. Ankle performed by Dr. [place] and Dr. Ruta Hinds 3 peroneus tendons noted with quartius tendon on lateral RT ankle but not on left No swelling or abnormality of tendons Right ATF ligament partial tear with avulsion fracture noted that is about 3 mm long from talus Dynamic drawer test did not show instability No joint effusion Talar dome with no fracture but some loss of cartilage width  Impression:  Small avulsion fracture and partial ATFL tear; incidental peroneuws quartius tendon identified on Right  Ultrasound and interpretation by Susan Hoffman. Susan Berns, MD   Assessment and Plan: 1) Ankle pain, right Patient with acute right ankle pain following a fall.  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  Patient does have increased laxity with inversion and ultrasound imaging did show right ATF tear with avulsion fracture.  This explains patient's symptoms and pain.  Advised to continue patient's home physical therapy.  Advised 1 foot balance exercises as well as standard reach exercise.  Also advised gentle dorsiflexion as well.  Patient is an active person and play soccer.  Advised that once patient has good balance with 1 foot balance exercises she may start to play soccer again.  Also advised orthotic use especially given her bunions.  Patient should continue to use bracing for stability.  Has several braces at home and will offer BIOflex  braces as well.  Follow-up as needed with Dr. Oneida Alar.   Dalphine Handing, PGY-3 [facility] Resident  10/11/2019 11:33 AM   I observed and examined the patient with the resident and agree with assessment and plan.  Note reviewed and modified by me. KB Kollin Udell

## 2019-10-11 NOTE — Patient Instructions (Addendum)
1. Continue to work on 1 foot balance 2. Continue stand and reach exercises 3. Continue gentle dorsiflexion exercises  4. You can start trying to play soccer when you are able to do 1 foot balance confidently 5. Please try orthodics as tolerated 6. You should try and use an ankle brace to help provide more stability    Today we found a peroneus quartus tendon. We do not think this is what is causing the pain but it is an extra tendon on your right side.   For your pain, it is likely secondary to the anterior talo-fibular ligament. There was an avulsion fracture noted in that area.

## 2019-11-25 ENCOUNTER — Telehealth (INDEPENDENT_AMBULATORY_CARE_PROVIDER_SITE_OTHER): Payer: 59 | Admitting: Psychiatry

## 2019-11-25 ENCOUNTER — Other Ambulatory Visit: Payer: Self-pay

## 2019-11-25 ENCOUNTER — Encounter: Payer: Self-pay | Admitting: Psychiatry

## 2019-11-25 DIAGNOSIS — F901 Attention-deficit hyperactivity disorder, predominantly hyperactive type: Secondary | ICD-10-CM | POA: Diagnosis not present

## 2019-11-25 MED ORDER — AMPHETAMINE-DEXTROAMPHETAMINE 10 MG PO TABS: 10.0000 mg | ORAL_TABLET | Freq: Two times a day (BID) | ORAL | 0 refills | Status: DC

## 2019-11-25 NOTE — Progress Notes (Signed)
Provider Location : [facility] Patient Location : Work  Virtual Visit via Video Note  I connected with [name] on 11/25/19 at  8:30 AM EDT by a video enabled telemedicine application and verified that I am speaking with the correct person using two identifiers.   I discussed the limitations of evaluation and management by telemedicine and the availability of in person appointments. The patient expressed understanding and agreed to proceed.     I discussed the assessment and treatment plan with the patient. The patient was provided an opportunity to ask questions and all were answered. The patient agreed with the plan and demonstrated an understanding of the instructions.   The patient was advised to call back or seek an in-person evaluation if the symptoms worsen or if the condition fails to improve as anticipated.  BH MD OP Progress Note  11/25/2019 8:52 AM Susan Hoffman  MRN:  [id number]  Chief Complaint:  Chief Complaint    Follow-up     HPI: Susan Hoffman is a 29 year old Caucasian female, employed, married, lives in [place], has a history of ADHD was evaluated by telemedicine today.  Patient today reports she is currently doing well.  She reports her anxiety symptoms related to the incident that she had at the gym has resolved.  She reports she is currently at a new gym and she loves it.  She currently denies any trauma related symptoms like intrusive memories, nightmares, hypervigilance or mood symptoms.  She is doing well at her work.  She has been working overtime and that has been going well so far.  She is compliant on the Adderall and reports the medication as beneficial.  She denies any side effects.  She reports sleep is good.  Patient denies any suicidality, homicidality or perceptual disturbances.  Patient denies any other concerns today.  Visit Diagnosis:    ICD-10-CM   1. Attention deficit hyperactivity disorder (ADHD), predominantly  hyperactive type  F90.1 amphetamine-dextroamphetamine (ADDERALL) 10 MG tablet    amphetamine-dextroamphetamine (ADDERALL) 10 MG tablet    amphetamine-dextroamphetamine (ADDERALL) 10 MG tablet    Past Psychiatric History: I have reviewed past psychiatric history from my progress note on 05/08/2017  Past Medical History:  Past Medical History:  Diagnosis Date  . Abnormal cells of cervix 2016   Was told needs PAP every 6 months due to abnormal cells.   . Acquired deafness of right ear 2015  . ADHD     Past Surgical History:  Procedure Laterality Date  . nexplanon  2017  . TYMPANOPLASTY    . wisdom teeth removal      Family Psychiatric History: I have reviewed family psychiatric history from my progress note on 05/08/2017  Family History:  Family History  Problem Relation Age of Onset  . Hypertension Father   . Hyperthyroidism Father   . Lung cancer Maternal Grandmother   . Lung cancer Paternal Grandmother   . Lung cancer Paternal Grandfather   . Cancer Maternal Aunt   . Ovarian cancer Neg Hx   . AAA (abdominal aortic aneurysm) Neg Hx   . Colon cancer Neg Hx   . Diabetes Neg Hx     Social History: Reviewed social history from my progress note on 05/08/2017 Social History   Socioeconomic History  . Marital status: Married    Spouse name: jonathan  . Number of children: 0  . Years of education: Not on file  . Highest education level: Doctorate  Occupational History  Comment: full time  Tobacco Use  . Smoking status: Never Smoker  . Smokeless tobacco: Never Used  Vaping Use  . Vaping Use: Never used  Substance and Sexual Activity  . Alcohol use: Yes    Alcohol/week: 2.0 standard drinks    Types: 1 Glasses of wine, 1 Cans of beer per week    Comment: wine weekly  . Drug use: No  . Sexual activity: Yes    Birth control/protection: Implant  Other Topics Concern  . Not on file  Social History Narrative  . Not on file   Social Determinants of Health    Financial Resource Strain:   . Difficulty of Paying Living Expenses:   Food Insecurity:   . Worried About Programme researcher, broadcasting/film/video in the Last Year:   . Barista in the Last Year:   Transportation Needs:   . Freight forwarder (Medical):   Marland Kitchen Lack of Transportation (Non-Medical):   Physical Activity:   . Days of Exercise per Week:   . Minutes of Exercise per Session:   Stress:   . Feeling of Stress :   Social Connections:   . Frequency of Communication with Friends and Family:   . Frequency of Social Gatherings with Friends and Family:   . Attends Religious Services:   . Active Member of Clubs or Organizations:   . Attends Banker Meetings:   Marland Kitchen Marital Status:     Allergies:  Allergies  Allergen Reactions  . Codeine   . Mushroom Extract Complex     Metabolic Disorder Labs: No results found for: HGBA1C, MPG No results found for: PROLACTIN Lab Results  Component Value Date   CHOL 147 06/27/2019   TRIG 31 06/27/2019   HDL 70 06/27/2019   CHOLHDL 2.1 06/27/2019   LDLCALC 69 06/27/2019   LDLCALC 76 06/23/2017   Lab Results  Component Value Date   TSH 0.889 06/27/2019   TSH 1.030 06/23/2018    Therapeutic Level Labs: No results found for: LITHIUM No results found for: VALPROATE No components found for:  CBMZ  Current Medications: Current Outpatient Medications  Medication Sig Dispense Refill  . [START ON 12/07/2019] amphetamine-dextroamphetamine (ADDERALL) 10 MG tablet Take 1 tablet (10 mg total) by mouth 2 (two) times daily. Take 7 am and 12 pm 60 tablet 0  . [START ON 01/05/2020] amphetamine-dextroamphetamine (ADDERALL) 10 MG tablet Take 1 tablet (10 mg total) by mouth 2 (two) times daily. Take 7 am and 12 noon 60 tablet 0  . [START ON 02/03/2020] amphetamine-dextroamphetamine (ADDERALL) 10 MG tablet Take 1 tablet (10 mg total) by mouth 2 (two) times daily. Take 7 am and 12 pm 60 tablet 0  . augmented betamethasone dipropionate (DIPROLENE-AF)  0.05 % ointment     . etonogestrel (NEXPLANON) 68 MG IMPL implant 1 each by Subdermal route once.     No current facility-administered medications for this visit.     Musculoskeletal: Strength & Muscle Tone: UTA Gait & Station: normal Patient leans: N/A  Psychiatric Specialty Exam: Review of Systems  Psychiatric/Behavioral: Negative for agitation, behavioral problems, confusion, decreased concentration, dysphoric mood, hallucinations, self-injury, sleep disturbance and suicidal ideas. The patient is not nervous/anxious and is not hyperactive.   All other systems reviewed and are negative.   There were no vitals taken for this visit.There is no height or weight on file to calculate BMI.  General Appearance: Casual  Eye Contact:  Fair  Speech:  Clear and Coherent  Volume:  Normal  Mood:  Euthymic  Affect:  Congruent  Thought Process:  Goal Directed and Descriptions of Associations: Intact  Orientation:  Full (Time, Place, and Person)  Thought Content: Logical   Suicidal Thoughts:  No  Homicidal Thoughts:  No  Memory:  Immediate;   Fair Recent;   Fair Remote;   Fair  Judgement:  Fair  Insight:  Fair  Psychomotor Activity:  Normal  Concentration:  Concentration: Fair and Attention Span: Fair  Recall:  [employer] of Knowledge: Fair  Language: Fair  Akathisia:  No  Handed:  Right  AIMS (if indicated): UTA  Assets:  Communication Skills Desire for Improvement Housing Social Support  ADL's:  Intact  Cognition: WNL  Sleep:  Fair   Screenings: PHQ2-9     Office Visit from 06/27/2019 in [facility] Office Visit from 06/23/2018 in [facility] Office Visit from [facility]/13/2017 in [facility]  PHQ-2 Total Score 0 0 0  PHQ-9 Total Score 3 0 --       Assessment and Plan: Susan Hoffman is a 29 year old Caucasian female, employed, married, has a history of ADHD was evaluated by telemedicine today.  Patient is currently doing well with regards to  her mood symptoms and attention and focus.  Discussed plan as noted below.  Plan ADHD-stable Adderall 10 mg p.o. twice daily I have provided 3 prescriptions with date specified, last 1 to be filled on or after 02/03/2020. I have reviewed [place] controlled substance database  Follow-up in clinic in 3 months or sooner if needed.  I have spent atleast 15 minutes non face to face with patient today. More than 50 % of the time was spent for preparing to see the patient ( e.g., review of test, records ), ordering medications and test ,psychoeducation and supportive psychotherapy and care coordination,as well as documenting clinical information in electronic health record. This note was generated in part or whole with voice recognition software. Voice recognition is usually quite accurate but there are transcription errors that can and very often do occur. I apologize for any typographical errors that were not detected and corrected.        Jomarie Longs, MD 11/25/2019, 8:52 AM

## 2020-01-11 ENCOUNTER — Encounter: Payer: Self-pay | Admitting: Internal Medicine

## 2020-01-12 ENCOUNTER — Telehealth: Payer: Self-pay | Admitting: Certified Nurse Midwife

## 2020-01-12 NOTE — Telephone Encounter (Signed)
Pt called in and stated that she has a Nexplanon, And that this month she has had a Period 2 times the 1st one lasted 3 days and  The second one started yesterday. The pt said that she only has one period a year with the Nexplanon. The pt did say she is having some cramping and bloating as well as she is very emotional. I told the pt I will send a message and to please allow 24- 48 hours for a reply. The pt verbally understood. Please advise

## 2020-01-13 NOTE — Telephone Encounter (Signed)
[facility] for an appointment.

## 2020-01-16 NOTE — Telephone Encounter (Signed)
Pt made an appt for 10/1.

## 2020-01-23 ENCOUNTER — Telehealth: Payer: Self-pay

## 2020-01-23 NOTE — Telephone Encounter (Signed)
Pt called in and  Stated that she needs a order for a hormone and thyroid panel for her appt on 10/1. I told the pt I will send a message to the nurse. 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 Please advise

## 2020-01-24 ENCOUNTER — Telehealth: Payer: Self-pay

## 2020-01-24 NOTE — Telephone Encounter (Signed)
No answer

## 2020-01-24 NOTE — Telephone Encounter (Signed)
mychart message sent

## 2020-01-27 ENCOUNTER — Encounter: Payer: Self-pay | Admitting: Certified Nurse Midwife

## 2020-01-27 ENCOUNTER — Ambulatory Visit (INDEPENDENT_AMBULATORY_CARE_PROVIDER_SITE_OTHER): Payer: 59 | Admitting: Certified Nurse Midwife

## 2020-01-27 ENCOUNTER — Other Ambulatory Visit: Payer: Self-pay

## 2020-01-27 VITALS — BP 113/83 | HR 65 | Ht 62.0 in | Wt 145.8 lb

## 2020-01-27 DIAGNOSIS — Z975 Presence of (intrauterine) contraceptive device: Secondary | ICD-10-CM | POA: Diagnosis not present

## 2020-01-27 DIAGNOSIS — G43829 Menstrual migraine, not intractable, without status migrainosus: Secondary | ICD-10-CM | POA: Diagnosis not present

## 2020-01-27 DIAGNOSIS — N921 Excessive and frequent menstruation with irregular cycle: Secondary | ICD-10-CM | POA: Diagnosis not present

## 2020-01-27 DIAGNOSIS — R140 Abdominal distension (gaseous): Secondary | ICD-10-CM

## 2020-01-27 DIAGNOSIS — R635 Abnormal weight gain: Secondary | ICD-10-CM

## 2020-01-27 DIAGNOSIS — R4586 Emotional lability: Secondary | ICD-10-CM

## 2020-01-27 NOTE — Patient Instructions (Signed)
Etonogestrel implant What is this medicine? ETONOGESTREL (et oh noe JES trel) is a contraceptive (birth control) device. It is used to prevent pregnancy. It can be used for up to 3 years. This medicine may be used for other purposes; ask your health care provider or pharmacist if you have questions. COMMON BRAND NAME(S): Implanon, Nexplanon What should I tell my health care provider before I take this medicine? They need to know if you have any of these conditions:  abnormal vaginal bleeding  blood vessel disease or blood clots  breast, cervical, endometrial, ovarian, liver, or uterine cancer  diabetes  gallbladder disease  heart disease or recent heart attack  high blood pressure  high cholesterol or triglycerides  kidney disease  liver disease  migraine headaches  seizures  stroke  tobacco smoker  an unusual or allergic reaction to etonogestrel, anesthetics or antiseptics, other medicines, foods, dyes, or preservatives  pregnant or trying to get pregnant  breast-feeding How should I use this medicine? This device is inserted just under the skin on the inner side of your upper arm by a health care professional. Talk to your pediatrician regarding the use of this medicine in children. Special care may be needed. Overdosage: If you think you have taken too much of this medicine contact a poison control center or emergency room at once. NOTE: This medicine is only for you. Do not share this medicine with others. What if I miss a dose? This does not apply. What may interact with this medicine? Do not take this medicine with any of the following medications:  amprenavir  fosamprenavir This medicine may also interact with the following medications:  acitretin  aprepitant  armodafinil  bexarotene  bosentan  carbamazepine  certain medicines for fungal infections like fluconazole, ketoconazole, itraconazole and voriconazole  certain medicines to treat  hepatitis, HIV or AIDS  cyclosporine  felbamate  griseofulvin  lamotrigine  modafinil  oxcarbazepine  phenobarbital  phenytoin  primidone  rifabutin  rifampin  rifapentine  St. John's wort  topiramate This list may not describe all possible interactions. Give your health care provider a list of all the medicines, herbs, non-prescription drugs, or dietary supplements you use. Also tell them if you smoke, drink alcohol, or use illegal drugs. Some items may interact with your medicine. What should I watch for while using this medicine? This product does not protect you against HIV infection (AIDS) or other sexually transmitted diseases. You should be able to feel the implant by pressing your fingertips over the skin where it was inserted. Contact your doctor if you cannot feel the implant, and use a non-hormonal birth control method (such as condoms) until your doctor confirms that the implant is in place. Contact your doctor if you think that the implant may have broken or become bent while in your arm. You will receive a user card from your health care provider after the implant is inserted. The card is a record of the location of the implant in your upper arm and when it should be removed. Keep this card with your health records. What side effects may I notice from receiving this medicine? Side effects that you should report to your doctor or health care professional as soon as possible:  allergic reactions like skin rash, itching or hives, swelling of the face, lips, or tongue  breast lumps, breast tissue changes, or discharge  breathing problems  changes in emotions or moods  coughing up blood  if you feel that the implant   may have broken or bent while in your arm  high blood pressure  pain, irritation, swelling, or bruising at the insertion site  scar at site of insertion  signs of infection at the insertion site such as fever, and skin redness, pain or  discharge  signs and symptoms of a blood clot such as breathing problems; changes in vision; chest pain; severe, sudden headache; pain, swelling, warmth in the leg; trouble speaking; sudden numbness or weakness of the face, arm or leg  signs and symptoms of liver injury like dark yellow or brown urine; general ill feeling or flu-like symptoms; light-colored stools; loss of appetite; nausea; right upper belly pain; unusually weak or tired; yellowing of the eyes or skin  unusual vaginal bleeding, discharge Side effects that usually do not require medical attention (report to your doctor or health care professional if they continue or are bothersome):  acne  breast pain or tenderness  headache  irregular menstrual bleeding  nausea This list may not describe all possible side effects. Call your doctor for medical advice about side effects. You may report side effects to FDA at 1-800-FDA-1088. Where should I keep my medicine? This drug is given in a hospital or clinic and will not be stored at home. NOTE: This sheet is a summary. It may not cover all possible information. If you have questions about this medicine, talk to your doctor, pharmacist, or health care provider.  2020 Elsevier/Gold Standard (2019-01-25 11:33:04)  

## 2020-01-27 NOTE — Progress Notes (Signed)
GYN ENCOUNTER NOTE  Subjective:       Susan Hoffman is a 29 y.o. G0P0000 female is here for gynecologic evaluation of the following issues:  1. Breakthrough bleeding with Nexplanon for the last two cycles as well as weight gain, mood swings, bloating and hormonal migraines  Denies difficulty breathing or respiratory distress, chest pain, abdominal pain, excessive vaginal bleeding, dysuria, and leg pain or swelling.      Gynecologic History  Patient's last menstrual period was 01/11/2020 (exact date). Period Duration (Days): 3/4 Period Pattern: (!) Irregular Menstrual Flow: Light, Moderate, Heavy Menstrual Control: Tampon, Thin pad, Maxi pad Menstrual Control Change Freq (Hours): 1-4 Dysmenorrhea: (!) Moderate Dysmenorrhea Symptoms: Cramping, Headache  Contraception: Nexplanon  Last Pap: 05/2017. Results were: normal  Obstetric History  OB History  Gravida Para Term Preterm AB Living  0 0 0 0 0 0  SAB TAB Ectopic Multiple Live Births  0 0 0 0 0    Past Medical History:  Diagnosis Date  . Abnormal cells of cervix 2016   Was told needs PAP every 6 months due to abnormal cells.   . Acquired deafness of right ear 2015  . ADHD     Past Surgical History:  Procedure Laterality Date  . nexplanon  2017  . TYMPANOPLASTY    . wisdom teeth removal      Current Outpatient Medications on File Prior to Visit  Medication Sig Dispense Refill  . amphetamine-dextroamphetamine (ADDERALL) 10 MG tablet Take 1 tablet (10 mg total) by mouth 2 (two) times daily. Take 7 am and 12 pm 60 tablet 0  . etonogestrel (NEXPLANON) 68 MG IMPL implant 1 each by Subdermal route once.    . Multiple Vitamins-Minerals (MULTI ADULT GUMMIES) CHEW Chew by mouth.    Marland Kitchen amphetamine-dextroamphetamine (ADDERALL) 10 MG tablet Take 1 tablet (10 mg total) by mouth 2 (two) times daily. Take 7 am and 12 noon 60 tablet 0  . [START ON 02/03/2020] amphetamine-dextroamphetamine (ADDERALL) 10 MG tablet Take 1 tablet  (10 mg total) by mouth 2 (two) times daily. Take 7 am and 12 pm 60 tablet 0   No current facility-administered medications on file prior to visit.    Allergies  Allergen Reactions  . Covid-19 (Mrna) Vaccine Anaphylaxis, Other (See Comments) and Shortness Of Breath  . Codeine   . Mushroom Extract Complex     Social History   Socioeconomic History  . Marital status: Married    Spouse name: jonathan  . Number of children: 0  . Years of education: Not on file  . Highest education level: Doctorate  Occupational History    Comment: full time  Tobacco Use  . Smoking status: Never Smoker  . Smokeless tobacco: Never Used  Vaping Use  . Vaping Use: Never used  Substance and Sexual Activity  . Alcohol use: Yes    Alcohol/week: 2.0 standard drinks    Types: 1 Glasses of wine, 1 Cans of beer per week    Comment: wine weekly  . Drug use: No  . Sexual activity: Yes    Birth control/protection: Implant  Other Topics Concern  . Not on file  Social History Narrative  . Not on file   Social Determinants of Health   Financial Resource Strain:   . Difficulty of Paying Living Expenses: Not on file  Food Insecurity:   . Worried About Programme researcher, broadcasting/film/video in the Last Year: Not on file  . Ran Out of Food in the Last  Year: Not on file  Transportation Needs:   . Lack of Transportation (Medical): Not on file  . Lack of Transportation (Non-Medical): Not on file  Physical Activity:   . Days of Exercise per Week: Not on file  . Minutes of Exercise per Session: Not on file  Stress:   . Feeling of Stress : Not on file  Social Connections:   . Frequency of Communication with Friends and Family: Not on file  . Frequency of Social Gatherings with Friends and Family: Not on file  . Attends Religious Services: Not on file  . Active Member of Clubs or Organizations: Not on file  . Attends Banker Meetings: Not on file  . Marital Status: Not on file  Intimate Partner Violence:   .  Fear of Current or Ex-Partner: Not on file  . Emotionally Abused: Not on file  . Physically Abused: Not on file  . Sexually Abused: Not on file    Family History  Problem Relation Age of Onset  . Hypertension Father   . Hyperthyroidism Father   . Lung cancer Maternal Grandmother   . Lung cancer Paternal Grandmother   . Lung cancer Paternal Grandfather   . Breast cancer Maternal Aunt   . Cancer Maternal Aunt   . Ovarian cancer Neg Hx   . AAA (abdominal aortic aneurysm) Neg Hx   . Colon cancer Neg Hx   . Diabetes Neg Hx     The following portions of the patient's history were reviewed and updated as appropriate: allergies, current medications, past family history, past medical history, past social history, past surgical history and problem list.  Review of Systems  ROS negative except as noted above. Information obtained from patient.   Objective:   BP 113/83   Pulse 65   Ht 5\' 2"  (1.575 m)   Wt 145 lb 12.8 oz (66.1 kg)   LMP 01/11/2020 (Exact Date)   BMI 26.67 kg/m    CONSTITUTIONAL: Well-developed, well-nourished female in no acute distress.   SKIN: Skin is warm and dry. No rash noted. Not diaphoretic. No erythema. No pallor. Nexplanon present in left arm.   GAD 7 : Generalized Anxiety Score 01/27/2020  Nervous, Anxious, on Edge 0  Control/stop worrying 1  Worry too much - different things 0  Trouble relaxing 1  Restless 0  Easily annoyed or irritable 1  Afraid - awful might happen 0  Total GAD 7 Score 3   Depression screen [facility] 2/9 01/27/2020 06/27/2019 06/23/2018 02/13/2017  Decreased Interest 0 0 0 0  Down, Depressed, Hopeless 1 0 0 0  PHQ - 2 Score 1 0 0 0  Altered sleeping 0 0 0 -  Tired, decreased energy 1 0 0 -  Change in appetite 0 0 0 -  Feeling bad or failure about yourself  0 0 0 -  Trouble concentrating 1 3 0 -  Moving slowly or fidgety/restless 0 0 0 -  Suicidal thoughts 0 0 0 -  PHQ-9 Score 3 3 0 -  Difficult doing work/chores Not difficult at  all Not difficult at all - -   Assessment:   1. Breakthrough bleeding on Nexplanon  - Thyroid Panel With TSH - FSH/LH - Estradiol - Progesterone   2. Weight gain   3. Mood swings  4. Abdominal bloating  5. Menstrual migraines  Plan:   Labs today, see orders.   Samples of Balcoltra provided.   Reviewed red flag symptoms and when to call.  RTC if symptoms worsen or fail to improve.    Serafina Royals, CNM [facility] 01/27/20 5:30 PM

## 2020-01-28 LAB — THYROID PANEL WITH TSH
Free Thyroxine Index: 1.5 (ref 1.2–4.9)
T3 Uptake Ratio: 26 % (ref 24–39)
T4, Total: 5.7 ug/dL (ref 4.5–12.0)
TSH: 1.1 u[IU]/mL (ref 0.450–4.500)

## 2020-01-28 LAB — FSH/LH
FSH: 3.6 m[IU]/mL
LH: 5.8 m[IU]/mL

## 2020-01-28 LAB — ESTRADIOL: Estradiol: 94.6 pg/mL

## 2020-01-28 LAB — PROGESTERONE: Progesterone: 0.2 ng/mL

## 2020-01-30 ENCOUNTER — Encounter: Payer: Self-pay | Admitting: Internal Medicine

## 2020-02-09 ENCOUNTER — Encounter: Payer: Self-pay | Admitting: Internal Medicine

## 2020-03-02 ENCOUNTER — Telehealth: Payer: 59 | Admitting: Psychiatry

## 2020-03-14 ENCOUNTER — Telehealth (INDEPENDENT_AMBULATORY_CARE_PROVIDER_SITE_OTHER): Payer: 59 | Admitting: Psychiatry

## 2020-03-14 ENCOUNTER — Other Ambulatory Visit: Payer: Self-pay

## 2020-03-14 ENCOUNTER — Encounter: Payer: Self-pay | Admitting: Psychiatry

## 2020-03-14 ENCOUNTER — Other Ambulatory Visit: Payer: Self-pay | Admitting: Psychiatry

## 2020-03-14 DIAGNOSIS — F901 Attention-deficit hyperactivity disorder, predominantly hyperactive type: Secondary | ICD-10-CM | POA: Diagnosis not present

## 2020-03-14 MED ORDER — AMPHETAMINE-DEXTROAMPHETAMINE 10 MG PO TABS: 10.0000 mg | ORAL_TABLET | Freq: Two times a day (BID) | ORAL | 0 refills | Status: DC

## 2020-03-14 NOTE — Progress Notes (Signed)
Virtual Visit via Video Note  I connected with Susan Hoffman on 03/14/20 at  4:00 PM EST by a video enabled telemedicine application and verified that I am speaking with the correct person using two identifiers.  Location Provider Location : [facility] Patient Location : Home  Participants: Patient , Provider    I discussed the limitations of evaluation and management by telemedicine and the availability of in person appointments. The patient expressed understanding and agreed to proceed.  I discussed the assessment and treatment plan with the patient. The patient was provided an opportunity to ask questions and all were answered. The patient agreed with the plan and demonstrated an understanding of the instructions.  The patient was advised to call back or seek an in-person evaluation if the symptoms worsen or if the condition fails to improve as anticipated.   BH MD OP Progress Note  03/14/2020 4:10 PM Susan Hoffman  MRN:  [id number]  Chief Complaint:  Chief Complaint    Follow-up     HPI: Susan Hoffman is a 29 year old Caucasian female, employed, lives in [place], has a history of ADHD was evaluated by telemedicine today.  Patient today reports she is currently doing well on the current medication regimen.  She reports she is able to function well at work.  She denies any attention and focus problems.  She reports sleep is good.  She reports appetite is fair.  She is compliant on the Adderall.  Patient denies any side effects to her medication.  She reports she is currently struggling with some problem with her birth control and has upcoming appointment with her provider for the same.  She is staying very active, playing soccer, going to the gym on a regular basis.  She looks forward to Thanksgiving holidays and plans to spend it with her family.  Patient denies any other concerns today.    Visit Diagnosis:    ICD-10-CM   1. Attention deficit  hyperactivity disorder (ADHD), predominantly hyperactive type  F90.1 amphetamine-dextroamphetamine (ADDERALL) 10 MG tablet    amphetamine-dextroamphetamine (ADDERALL) 10 MG tablet    amphetamine-dextroamphetamine (ADDERALL) 10 MG tablet    Past Psychiatric History: I have reviewed past psychiatric history from my progress note on 05/08/2017.  Past Medical History:  Past Medical History:  Diagnosis Date  . Abnormal cells of cervix 2016   Was told needs PAP every 6 months due to abnormal cells.   . Acquired deafness of right ear 2015  . ADHD     Past Surgical History:  Procedure Laterality Date  . nexplanon  2017  . TYMPANOPLASTY    . wisdom teeth removal      Family Psychiatric History: I have reviewed family psychiatric history from my progress note on 05/08/2017.  Family History:  Family History  Problem Relation Age of Onset  . Hypertension Father   . Hyperthyroidism Father   . Lung cancer Maternal Grandmother   . Lung cancer Paternal Grandmother   . Lung cancer Paternal Grandfather   . Breast cancer Maternal Aunt   . Cancer Maternal Aunt   . Ovarian cancer Neg Hx   . AAA (abdominal aortic aneurysm) Neg Hx   . Colon cancer Neg Hx   . Diabetes Neg Hx     Social History: Reviewed social history from my progress note on 05/08/2017. Social History   Socioeconomic History  . Marital status: Married    Spouse name: jonathan  . Number of children: 0  . Years of education:  Not on file  . Highest education level: Doctorate  Occupational History    Comment: full time  Tobacco Use  . Smoking status: Never Smoker  . Smokeless tobacco: Never Used  Vaping Use  . Vaping Use: Never used  Substance and Sexual Activity  . Alcohol use: Yes    Alcohol/week: 2.0 standard drinks    Types: 1 Glasses of wine, 1 Cans of beer per week    Comment: wine weekly  . Drug use: No  . Sexual activity: Yes    Birth control/protection: Implant  Other Topics Concern  . Not on file  Social  History Narrative  . Not on file   Social Determinants of Health   Financial Resource Strain:   . Difficulty of Paying Living Expenses: Not on file  Food Insecurity:   . Worried About Programme researcher, broadcasting/film/video in the Last Year: Not on file  . Ran Out of Food in the Last Year: Not on file  Transportation Needs:   . Lack of Transportation (Medical): Not on file  . Lack of Transportation (Non-Medical): Not on file  Physical Activity:   . Days of Exercise per Week: Not on file  . Minutes of Exercise per Session: Not on file  Stress:   . Feeling of Stress : Not on file  Social Connections:   . Frequency of Communication with Friends and Family: Not on file  . Frequency of Social Gatherings with Friends and Family: Not on file  . Attends Religious Services: Not on file  . Active Member of Clubs or Organizations: Not on file  . Attends Banker Meetings: Not on file  . Marital Status: Not on file    Allergies:  Allergies  Allergen Reactions  . Covid-19 (Mrna) Vaccine Anaphylaxis, Other (See Comments) and Shortness Of Breath  . Codeine   . Mushroom Extract Complex     Metabolic Disorder Labs: No results found for: HGBA1C, MPG No results found for: PROLACTIN Lab Results  Component Value Date   CHOL 147 06/27/2019   TRIG 31 06/27/2019   HDL 70 06/27/2019   CHOLHDL 2.1 06/27/2019   LDLCALC 69 06/27/2019   LDLCALC 76 06/23/2017   Lab Results  Component Value Date   TSH 1.100 01/27/2020   TSH 0.889 06/27/2019    Therapeutic Level Labs: No results found for: LITHIUM No results found for: VALPROATE No components found for:  CBMZ  Current Medications: Current Outpatient Medications  Medication Sig Dispense Refill  . [START ON 03/20/2020] amphetamine-dextroamphetamine (ADDERALL) 10 MG tablet Take 1 tablet (10 mg total) by mouth 2 (two) times daily. Take 7 am and 12 pm 60 tablet 0  . [START ON 04/18/2020] amphetamine-dextroamphetamine (ADDERALL) 10 MG tablet Take 1  tablet (10 mg total) by mouth 2 (two) times daily. Take 7 am and 12 noon 60 tablet 0  . [START ON 05/16/2020] amphetamine-dextroamphetamine (ADDERALL) 10 MG tablet Take 1 tablet (10 mg total) by mouth 2 (two) times daily. Take 7 am and 12 pm 60 tablet 0  . etonogestrel (NEXPLANON) 68 MG IMPL implant 1 each by Subdermal route once.    . Multiple Vitamins-Minerals (MULTI ADULT GUMMIES) CHEW Chew by mouth.     No current facility-administered medications for this visit.     Musculoskeletal: Strength & Muscle Tone: UTA Gait & Station: UTA Patient leans: N/A  Psychiatric Specialty Exam: Review of Systems  Psychiatric/Behavioral: Negative for agitation, behavioral problems, confusion, decreased concentration, dysphoric mood, hallucinations, self-injury, sleep disturbance and  suicidal ideas. The patient is not nervous/anxious and is not hyperactive.   All other systems reviewed and are negative.   There were no vitals taken for this visit.There is no height or weight on file to calculate BMI.  General Appearance: Casual  Eye Contact:  Fair  Speech:  Clear and Coherent  Volume:  Normal  Mood:  Euthymic  Affect:  Congruent  Thought Process:  Goal Directed and Descriptions of Associations: Intact  Orientation:  Full (Time, Place, and Person)  Thought Content: Logical   Suicidal Thoughts:  No  Homicidal Thoughts:  No  Memory:  Immediate;   Fair Recent;   Fair Remote;   Fair  Judgement:  Fair  Insight:  Fair  Psychomotor Activity:  Normal  Concentration:  Concentration: Fair and Attention Span: Fair  Recall:  [employer] of Knowledge: Fair  Language: Fair  Akathisia:  No  Handed:  Right  AIMS (if indicated): UTA  Assets:  Communication Skills Desire for Improvement Social Support  ADL's:  Intact  Cognition: WNL  Sleep:  Fair   Screenings: GAD-7     Office Visit from 01/27/2020 in [facility]  Total GAD-7 Score 3    PHQ2-9     Office Visit from 01/27/2020 in  [facility] Office Visit from 06/27/2019 in [facility] Visit from 06/23/2018 in [facility] Office Visit from 02/13/2017 in [facility]  PHQ-2 Total Score 1 0 0 0  PHQ-9 Total Score 3 3 0 --       Assessment and Plan: Susan Hoffman is a 29 year old Caucasian female, employed, married, has a history of ADHD was evaluated by telemedicine today.  Patient is currently stable on current medication regimen.  Plan as noted below.  Plan ADHD-stable Adderall 10 mg p.o. twice daily I have provided 3 prescriptions with date specified last one to be filled on or after 05/16/2020 I have reviewed [place] controlled substance database. I have reviewed blood pressure in E HR-dated 01/27/2020-113/83, heart rate 65.  Follow-up in clinic in 3 months or sooner if needed.  I have spent atleast 15 minutes face to face by video with patient today. More than 50 % of the time was spent for preparing to see the patient ( e.g., review of test, records ), ordering medications and test ,psychoeducation and supportive psychotherapy and care coordination,as well as documenting clinical information in electronic health record.This note was generated in part or whole with voice recognition software. Voice recognition is usually quite accurate but there are transcription errors that can and very often do occur. I apologize for any typographical errors that were not detected and corrected.       Jomarie Longs, MD 03/14/2020, 4:10 PM

## 2020-04-13 ENCOUNTER — Other Ambulatory Visit: Payer: Self-pay

## 2020-04-13 ENCOUNTER — Encounter: Payer: Self-pay | Admitting: Certified Nurse Midwife

## 2020-04-13 ENCOUNTER — Ambulatory Visit (INDEPENDENT_AMBULATORY_CARE_PROVIDER_SITE_OTHER): Payer: 59 | Admitting: Certified Nurse Midwife

## 2020-04-13 VITALS — BP 111/80 | HR 66 | Wt 149.7 lb

## 2020-04-13 DIAGNOSIS — Z30011 Encounter for initial prescription of contraceptive pills: Secondary | ICD-10-CM

## 2020-04-13 DIAGNOSIS — Z3046 Encounter for surveillance of implantable subdermal contraceptive: Secondary | ICD-10-CM | POA: Diagnosis not present

## 2020-04-13 MED ORDER — BALCOLTRA 0.1-20 MG-MCG(21) PO TABS: 1.0000 | ORAL_TABLET | Freq: Every day | ORAL | 1 refills | Status: DC

## 2020-04-13 NOTE — Progress Notes (Signed)
Susan Hoffman is a 29 y.o. year old Caucasian female here for Nexplanon removal.  Patient given informed consent for removal of her Nexplanon.  BP 111/80   Pulse 66   Wt 149 lb 11.2 oz (67.9 kg)   BMI 27.38 kg/m   Appropriate time out taken. Nexplanon site identified.  Area prepped in usual sterile fashon. One cc of 2% lidocaine was used to anesthetize the area at the distal end of the implant. A small stab incision was made right beside the implant on the distal portion.  The Implanon rod was grasped using hemostats and removed without difficulty.  There was less than 3 cc blood loss. There were no complications.  Steri-strips were applied over the small incision and a pressure bandage was applied.  The patient tolerated the procedure well.  She was instructed to keep the area clean and dry, remove pressure bandage in 24 hours, and keep insertion site covered with the steri-strip for 3-5 days.    Rx Balcoltra, see orders.   Reviewed red flag symptoms and when to call.   RTC x 1 year for ANNUAL EXAM or sooner if needed.    Serafina Royals, CNM  [facility], [facility] 04/13/20 3:07 PM

## 2020-04-13 NOTE — Progress Notes (Signed)
Pt present for nexplanon removal. States the [place] helped with her migraines. States she would like to discuss contraceptive options. desires pregnancy in the next year.

## 2020-04-13 NOTE — Patient Instructions (Addendum)
Preparing for Pregnancy If you are considering becoming pregnant, make an appointment to see your regular health care provider to learn how to prepare for a safe and healthy pregnancy (preconception care). During a preconception care visit, your health care provider will:  Do a complete physical exam, including a Pap test.  Take a complete medical history.  Give you information, answer your questions, and help you resolve problems. Preconception checklist Medical history  Tell your health care provider about any current or past medical conditions. Your pregnancy or your ability to become pregnant may be affected by chronic conditions, such as diabetes, chronic hypertension, and thyroid problems.  Include your family's medical history as well as your partner's medical history.  Tell your health care provider about any history of STIs (sexually transmitted infections).These can affect your pregnancy. In some cases, they can be passed to your baby. Discuss any concerns that you have about STIs.  If indicated, discuss the benefits of genetic testing. This testing will show whether there are any genetic conditions that may be passed from you or your partner to your baby.  Tell your health care provider about: ? Any problems you have had with conception or pregnancy. ? Any medicines you take. These include vitamins, herbal supplements, and over-the-counter medicines. ? Your history of immunizations. Discuss any vaccinations that you may need. Diet  Ask your health care provider what to include in a healthy diet that has a balance of nutrients. This is especially important when you are pregnant or preparing to become pregnant.  Ask your health care provider to help you reach a healthy weight before pregnancy. ? If you are overweight, you may be at higher risk for certain complications, such as high blood pressure, diabetes, and preterm birth. ? If you are underweight, you are more likely to  have a baby who has a low birth weight. Lifestyle, work, and home  Let your health care provider know: ? About any lifestyle habits that you have, such as alcohol use, drug use, or smoking. ? About recreational activities that may put you at risk during pregnancy, such as downhill skiing and certain exercise programs. ? Tell your health care provider about any international travel, especially any travel to places with an active Zika virus outbreak. ? About harmful substances that you may be exposed to at work or at home. These include chemicals, pesticides, radiation, or even litter boxes. ? If you do not feel safe at home. Mental health  Tell your health care provider about: ? Any history of mental health conditions, including feelings of depression, sadness, or anxiety. ? Any medicines that you take for a mental health condition. These include herbs and supplements. Home instructions to prepare for pregnancy Lifestyle   Eat a balanced diet. This includes fresh fruits and vegetables, whole grains, lean meats, low-fat dairy products, healthy fats, and foods that are high in fiber. Ask to meet with a nutritionist or registered dietitian for assistance with meal planning and goals.  Get regular exercise. Try to be active for at least 30 minutes a day on most days of the week. Ask your health care provider which activities are safe during pregnancy.  Do not use any products that contain nicotine or tobacco, such as cigarettes and e-cigarettes. If you need help quitting, ask your health care provider.  Do not drink alcohol.  Do not take illegal drugs.  Maintain a healthy weight. Ask your health care provider what weight range is right for you. General   instructions  Keep an accurate record of your menstrual periods. This makes it easier for your health care provider to determine your baby's due date.  Begin taking prenatal vitamins and folic acid supplements daily as directed by your  health care provider.  Manage any chronic conditions, such as high blood pressure and diabetes, as told by your health care provider. This is important. How do I know that I am pregnant? You may be pregnant if you have been sexually active and you miss your period. Symptoms of early pregnancy include:  Mild cramping.  Very light vaginal bleeding (spotting).  Feeling unusually tired.  Nausea and vomiting (morning sickness). If you have any of these symptoms and you suspect that you might be pregnant, you can take a home pregnancy test. These tests check for a hormone in your urine (human chorionic gonadotropin, or hCG). A woman's body begins to make this hormone during early pregnancy. These tests are very accurate. Wait until at least the first day after you miss your period to take one. If the test shows that you are pregnant (you get a positive result), call your health care provider to make an appointment for prenatal care. What should I do if I become pregnant?      Make an appointment with your health care provider as soon as you suspect you are pregnant.  Do not use any products that contain nicotine, such as cigarettes, chewing tobacco, and e-cigarettes. If you need help quitting, ask your health care provider.  Do not drink alcoholic beverages. Alcohol is related to a number of birth defects.  Avoid toxic odors and chemicals.  You may continue to have sexual intercourse if it does not cause pain or other problems, such as vaginal bleeding. This information is not intended to replace advice given to you by your health care provider. Make sure you discuss any questions you have with your health care provider. Document Revised: 04/16/2017 Document Reviewed: 11/04/2015 Elsevier Patient Education  2020 [employer].   Ethinyl Estradiol; Levonorgestrel; Ferrous bisglycinate oral tablets What is this medicine? ETHINYL ESTRADIOL; LEVONORGESTREL; FERROUS BISGLYCINATE (ETH in il es  tra DYE ole; LEE voh nor jes trel; FER [place] bis GLY sin ate) is an oral contraceptive (birth control pill). This product combines two types of female hormones, an estrogen and a progestin. It is used to prevent ovulation and pregnancy. This medicine may be used for other purposes; ask your health care provider or pharmacist if you have questions. COMMON BRAND NAME(S): Balcoltra What should I tell my health care provider before I take this medicine? They need to know if you have any of these conditions:  abnormal vaginal bleeding  blood vessel disease  breast, cervical, endometrial, ovarian, liver, or uterine cancer  diabetes  gallbladder disease  heart disease or recent heart attack  high blood pressure  high cholesterol  history of blood clots  kidney disease  liver disease  migraine headaches  smoke tobacco  stroke  systemic lupus erythematosus (SLE)  an unusual or allergic reaction to estrogens, progestins, other medicines, foods, dyes, or preservatives  pregnant or trying to get pregnant  breast-feeding How should I use this medicine? Take this medicine by mouth. To reduce nausea, this medicine may be taken with food. Follow the directions on the prescription label. Take this medicine at the same time each day and in the order directed on the package. Do not take your medicine more often than directed. A patient package insert for the product  will be given with each prescription and refill. Read this sheet carefully each time. The sheet may change frequently. Contact your pediatrician regarding the use of this medicine in children. Special care may be needed. This medicine has been used in female children who have started having menstrual periods. Overdosage: If you think you have taken too much of this medicine contact a poison control center or emergency room at once. NOTE: This medicine is only for you. Do not share this medicine with others. What if I miss a  dose? If you miss a dose, refer to the patient information sheet you received with your medicine for direction. If you miss more than one pill, this medicine may not be as effective and you may need to use another form of birth control. What may interact with this medicine? Do not take this medicine with the following medication:  dasabuvir; ombitasvir; paritaprevir; ritonavir  ombitasvir; paritaprevir; ritonavir This medicine may also interact with the following medications:  acetaminophen  antibiotics or medicines for infections, especially rifampin, rifabutin, rifapentine, and griseofulvin, and possibly penicillins or tetracyclines  aprepitant  ascorbic acid (vitamin C)  atorvastatin  barbiturate medicines, such as phenobarbital  bosentan  caffeine  carbamazepine  clofibrate  cyclosporine  dantrolene  doxercalciferol  felbamate  grapefruit juice  hydrocortisone  medicines for anxiety or sleeping problems, such as diazepam or temazepam  medicines for diabetes, including pioglitazone  mineral oil  modafinil  mycophenolate  nefazodone  oxcarbazepine  phenytoin  prednisolone  ritonavir or other medicines for HIV infection or AIDS  rosuvastatin  selegiline  soy isoflavones supplements  St. John's wort  tamoxifen or raloxifene  theophylline  thyroid hormones  topiramate  warfarin This list may not describe all possible interactions. Give your health care provider a list of all the medicines, herbs, non-prescription drugs, or dietary supplements you use. Also tell them if you smoke, drink alcohol, or use illegal drugs. Some items may interact with your medicine. What should I watch for while using this medicine? Visit your doctor or health care professional for regular checks on your progress. You will need a regular breast and pelvic exam and Pap smear while on this medicine. Use an additional method of contraception during the first  cycle that you take these tablets. If you have any reason to think you are pregnant, stop taking this medicine right away and contact your doctor or health care professional. If you are taking this medicine for hormone related problems, it may take several cycles of use to see improvement in your condition. Smoking increases the risk of getting a blood clot or having a stroke while you are taking birth control pills, especially if you are more than 29 years old. You are strongly advised not to smoke. This medicine can make your body retain fluid, making your fingers, hands, or ankles swell. Your blood pressure can go up. Contact your doctor or health care professional if you feel you are retaining fluid. This medicine can make you more sensitive to the sun. Keep out of the sun. If you cannot avoid being in the sun, wear protective clothing and use sunscreen. Do not use sun lamps or tanning beds/booths. If you wear contact lenses and notice visual changes, or if the lenses begin to feel uncomfortable, consult your eye care specialist. In some women, tenderness, swelling, or minor bleeding of the gums may occur. Notify your dentist if this happens. Brushing and flossing your teeth regularly may help limit this. See your dentist  regularly and inform your dentist of the medicines you are taking. If you are going to have elective surgery, you may need to stop taking this medicine before the surgery. Consult your health care professional for advice. This medicine does not protect you against HIV infection (AIDS) or any other sexually transmitted diseases. What side effects may I notice from receiving this medicine? Side effects that you should report to your doctor or health care professional as soon as possible:  allergic reactions like skin rash, itching or hives, swelling of the face, lips, or tongue  breast tissue changes or discharge  changes in vaginal bleeding during your period or between your  periods  changes in vision  chest pain  confusion  coughing up blood  dizziness  feeling faint or lightheaded  headaches or migraines  leg, arm or groin pain  loss of balance or coordination  severe or sudden headaches  stomach pain (severe)  sudden shortness of breath  sudden numbness or weakness of the face, arm or leg  symptoms of vaginal infection like itching, irritation or unusual discharge  tenderness in the upper abdomen  trouble speaking or understanding  vomiting  yellowing of the eyes or skin Side effects that usually do not require medical attention (report these to your doctor or health care professional if they continue or are bothersome):  breakthrough bleeding and spotting that continues beyond the 3 initial cycles of pills  breast tenderness  mood changes, anxiety, depression, frustration, anger, or emotional outbursts  increased sensitivity to sun or ultraviolet light  nausea  skin rash, acne, or brown spots on the skin  weight gain (slight) This list may not describe all possible side effects. Call your doctor for medical advice about side effects. You may report side effects to FDA at 1-800-FDA-1088. Where should I keep my medicine? Keep out of the reach of children. Store at room temperature between 15 and 30 degrees C (59 and 86 degrees F). Throw away any unused medicine after the expiration date. NOTE: This sheet is a summary. It may not cover all possible information. If you have questions about this medicine, talk to your doctor, pharmacist, or health care provider.  2020 Elsevier/Gold Standard (2016-05-19 15:12:39)

## 2020-04-19 ENCOUNTER — Other Ambulatory Visit: Payer: Self-pay | Admitting: Certified Nurse Midwife

## 2020-05-24 ENCOUNTER — Encounter: Payer: Self-pay | Admitting: Internal Medicine

## 2020-05-25 ENCOUNTER — Encounter: Payer: 59 | Admitting: Obstetrics and Gynecology

## 2020-05-25 ENCOUNTER — Ambulatory Visit (INDEPENDENT_AMBULATORY_CARE_PROVIDER_SITE_OTHER): Payer: 59 | Admitting: Obstetrics and Gynecology

## 2020-05-25 ENCOUNTER — Encounter: Payer: Self-pay | Admitting: Obstetrics and Gynecology

## 2020-05-25 ENCOUNTER — Other Ambulatory Visit: Payer: Self-pay

## 2020-05-25 VITALS — BP 138/68 | HR 61 | Ht 62.0 in | Wt 153.4 lb

## 2020-05-25 DIAGNOSIS — Z3189 Encounter for other procreative management: Secondary | ICD-10-CM | POA: Diagnosis not present

## 2020-05-25 DIAGNOSIS — Z8742 Personal history of other diseases of the female genital tract: Secondary | ICD-10-CM | POA: Diagnosis not present

## 2020-05-25 NOTE — Progress Notes (Signed)
Pt

## 2020-05-25 NOTE — Patient Instructions (Signed)
Preparing for Pregnancy If you are planning to become pregnant, talk to your health care provider about preconception care. This type of care helps you prepare for a safe and healthy pregnancy. During this visit, your health care provider will:  Do a complete physical exam, including a Pap test.  Take your complete medical history.  Give you information, answer your questions, and help you resolve problems. Preconception checklist Medical history  Tell your health care provider about any medical conditions you have or have had. Your pregnancy or your ability to become pregnant may be affected by long-term (chronic) conditions, such as: ? Diabetes. ? High blood pressure (hypertension). ? Thyroid problems.  Tell your health care provider about your family's medical history and your partner's medical history.   Tell your health care provider if you have or have had any sexually transmitted infections, or STIs. These can affect your pregnancy. In some cases, they can be passed to your baby.  If needed, discuss the benefits of genetic testing. This test checks for conditions that may be passed from parent to child.  Tell your health care provider about: ? Any problems you had getting pregnant or while pregnant. ? Any medicines you take. These include vitamins, herbal supplements, and over-the-counter medicines. ? Your history of getting vaccines. Discuss any vaccines that you may need. Diet  Ask your health care provider about what foods to eat in order to get a balance of nutrients. This is especially important when you are pregnant or preparing to become pregnant. It is recommended that women of childbearing age take a folic acid supplement of 400 mcg daily and eat foods rich in folic acid to prevent certain birth defects.  Ask your health care provider to help you reach a healthy weight before pregnancy. ? If you are overweight, you may have a higher risk for certain problems. These  include hypertension, diabetes, and early (preterm) birth. ? If you are underweight, you are more likely to have a baby who has a low birth weight. Lifestyle, work, and home Let your health care provider know about:  Any lifestyle habits that you have, such as use of alcohol, drugs, or tobacco products.  Fun and leisure activities that may put you at risk during pregnancy, such as downhill skiing and certain exercise programs.  Any plans to travel out of the country, especially to places with an active Zika virus outbreak.  Harmful substances that you may be exposed to at work or at home. These include chemicals, pesticides, radiation, and substances from cat litter boxes.  Any concerns you have for your safety at home. Mental health Tell your health care provider about:  Any history of mental health conditions, including feelings of depression, sadness, or anxiety.  Any medicines that you take for a mental health condition. These include herbs and supplements. How do I know that I am pregnant? You may be pregnant if you have been sexually active and you miss your period. Other symptoms of early pregnancy include:  Mild cramping.  Very light vaginal bleeding (spotting).  Feeling more tired than usual.  Nausea and vomiting. These may be signs of morning sickness. Take a home pregnancy test if you have any of these symptoms. This test checks for a hormone in your urine called human chorionic gonadotropin, or hCG. A woman's body begins to make this hormone during early pregnancy. These tests are very accurate. Wait until at least the first day after you miss your period to take a   home pregnancy test. If the test shows that you are pregnant, call your health care provider for a prenatal care visit. What should I do if I become pregnant?  Schedule a visit with your health care provider as soon as you suspect you are pregnant.  Talk to your health care provider if you are taking  prescription medicines to determine if they are safe to take during pregnancy.  You may continue to have sex if it does not cause pain or other problems, such as vaginal bleeding. Follow these instructions at home: Eating and drinking  Follow instructions from your health care provider about eating or drinking restrictions.  Drink enough fluid to keep your urine pale yellow.  Eat a balanced diet. This includes fresh fruits and vegetables, whole grains, lean meats, low-fat dairy products, healthy fats, and foods that are high in fiber. Ask to meet with a nutritionist or registered dietitian for help with meal planning and goals.  Avoid eating raw or undercooked meat and seafood.  Avoid eating or drinking unpasteurized dairy products.   Lifestyle  Get regular exercise. Try to be active for at least 30 minutes a day on most days of the week. Ask your health care provider which activities are safe during pregnancy.  Maintain a healthy weight.  Avoid toxic fumes and chemicals.  Avoid cleaning cat litter boxes. Cat feces may contain a harmful parasite called toxoplasma.  Avoid travel to countries where Zika virus is common.  Do not use any products that contain nicotine or tobacco, such as cigarettes, e-cigarettes, and chewing tobacco. If you need help quitting, ask your health care provider.  Do not drink alcohol or use drugs.      General instructions  Keep an accurate record of your menstrual periods. This makes it easier for your health care provider to determine your baby's due date.  Take over-the-counter and prescription medicines only as told by your health care provider.  Begin taking prenatal vitamins and folic acid supplements daily as directed.  Manage any chronic conditions, such as hypertension and diabetes, as told by your health care provider. This is important. Summary  If you are planning to become pregnant, talk to your health care provider about preconception  care. This is an important part of planning for a healthy pregnancy.  Women of childbearing age should take 400 mcg of folic acid daily in addition to eating a diet rich in folic acid. This will prevent certain birth defects.  Schedule a visit with your health care provider as soon as you suspect you are pregnant. Tell your health care provider about your medical history, lifestyle activities, home safety, and other things that may concern you. This information is not intended to replace advice given to you by your health care provider. Make sure you discuss any questions you have with your health care provider. Document Revised: 01/12/2019 Document Reviewed: 01/12/2019 Elsevier Patient Education  2021 [employer]vier Inc.  

## 2020-05-25 NOTE — Progress Notes (Signed)
GYNECOLOGY PROGRESS NOTE  Subjective:    Patient ID: Susan Hoffman, female    DOB: 10/31/1990, 30 y.o.   MRN: [id number]  HPI  Patient is a 30 y.o. G0P0000 female who presents for discussion of future fertility.  She is currently a patient of Susan Hoffman, CNM of [facility]. She has a history of long-term use of contraception, including Nexplanon use for ~ 14 years.  She recently discontinued her Nexplanon in December, and was placed on combined OCPs (20 mcg dosing).  Her plan is to taper off hormones over time as she desires to conceive in the near future.  She reports that she was afraid to just discontinue "cold [place]" as in the past she had a history of hormonal imbalance that lead to syncopal episodes.    Additionally, she has questions regarding the type of care she will require once pregnant.  Susan Hoffman notes that she was told d in the remote past that she would be high risk due to the fact that she had a history of abnormal pap smears, requiring biopsies.  They noted that she could have a lot of scarring of the cervix or could have incompetency and increased risk of preterm labor.   Lastly, she has general questions regarding pregnancy once it does occur, including when to begin prenatal vitamins, caffeine intake, dietary restrictions, and exercise/activity.   The following portions of the patient's history were reviewed and updated as appropriate:  She  has a past medical history of Abnormal cells of cervix (2016), Acquired deafness of right ear (2015), and ADHD.   She  has a past surgical history that includes wisdom teeth removal; nexplanon (2017); and Tympanoplasty.   Her family history includes Breast cancer in her maternal aunt; Cancer in her maternal aunt; Hypertension in her father; Hyperthyroidism in her father; Lung cancer in her maternal grandmother, paternal grandfather, and paternal grandmother.   She  reports that she has never smoked. She has never used  smokeless tobacco. She reports current alcohol use of about 2.0 standard drinks of alcohol per week. She reports that she does not use drugs.   Current Outpatient Medications on File Prior to Visit  Medication Sig Dispense Refill  . amphetamine-dextroamphetamine (ADDERALL) 10 MG tablet Take 1 tablet (10 mg total) by mouth 2 (two) times daily. Take 7 am and 12 pm 60 tablet 0  . Levonorgest-Eth Estrad-Fe Bisg (BALCOLTRA) 0.1-20 MG-MCG(21) TABS Take 1 tablet by mouth daily. 84 tablet 1  . Multiple Vitamins-Minerals (MULTI ADULT GUMMIES) CHEW Chew by mouth.    Marland Kitchen amphetamine-dextroamphetamine (ADDERALL) 10 MG tablet Take 1 tablet (10 mg total) by mouth 2 (two) times daily. Take 7 am and 12 noon (Patient not taking: Reported on 05/25/2020) 60 tablet 0  . amphetamine-dextroamphetamine (ADDERALL) 10 MG tablet Take 1 tablet (10 mg total) by mouth 2 (two) times daily. Take 7 am and 12 pm (Patient not taking: Reported on 05/25/2020) 60 tablet 0   No current facility-administered medications on file prior to visit.   She is allergic to covid-19 (mrna) vaccine, codeine, and mushroom extract complex..  Review of Systems Pertinent items noted in HPI and remainder of comprehensive ROS otherwise negative.   Objective:   Blood pressure 138/68, pulse 61, height 5\' 2"  (1.575 m), weight 153 lb 6.4 oz (69.6 kg), last menstrual period 04/29/2020. General appearance: alert and no distress Remainder of exam deferred.   Assessment:   Fertility counseling History of abnormal pap smear Contraception management.  Plan:   1. Fertility counseling - discussion had with patient regarding her fertility after long-term contraception use.  She states that she just had her first scheduled cycle after first round of OCPs, currently in the middle of her second pack.  Advised that she can decrease from the 20 mcg to 10 mcg dosing after she finishes current pill pack. Given samples of Lo-Loestrin x 2. Once completed, can then  remain hormone free and attempt conception. Advised to begin PNV at that time. Reviewed caffeine, dietary restrictions in pregnancy, as well as exercise.  2. H/o abnormal pap smear - patient with h/o abnormal pap in 2016, required pap q 6 months. Reviewed most recent pap smear, was negative, performed in 2019.  Can continue routine screening. Advised that if she had a biopsy (or even a LEEP), she could still be managed with midwifery care unless problems arose during the pregnancy.  Could perform cervical length at 14-16 weeks if concerns present for cervical issues.  3. RTC once pregnancy occurs.   A total of 15 minutes were spent face-to-face with the patient during this encounter and over half of that time dealt with counseling and coordination of care.  Hildred Laser, MD [facility]

## 2020-06-08 ENCOUNTER — Encounter: Payer: 59 | Admitting: Obstetrics and Gynecology

## 2020-06-12 ENCOUNTER — Ambulatory Visit
Admission: RE | Admit: 2020-06-12 | Discharge: 2020-06-12 | Disposition: A | Discharge status: Home / Self Care | Payer: 59 | Attending: Internal Medicine | Admitting: Internal Medicine

## 2020-06-12 ENCOUNTER — Other Ambulatory Visit: Payer: Self-pay | Admitting: Internal Medicine

## 2020-06-12 ENCOUNTER — Encounter: Payer: Self-pay | Admitting: Internal Medicine

## 2020-06-12 ENCOUNTER — Other Ambulatory Visit: Payer: Self-pay

## 2020-06-12 ENCOUNTER — Ambulatory Visit (INDEPENDENT_AMBULATORY_CARE_PROVIDER_SITE_OTHER): Payer: 59 | Admitting: Internal Medicine

## 2020-06-12 ENCOUNTER — Ambulatory Visit
Admission: RE | Admit: 2020-06-12 | Discharge: 2020-06-12 | Disposition: A | Discharge status: Home / Self Care | Payer: 59 | Source: Ambulatory Visit | Attending: Internal Medicine | Admitting: Internal Medicine

## 2020-06-12 VITALS — BP 124/78 | HR 61 | Temp 97.9°F | Ht 62.0 in | Wt 153.0 lb

## 2020-06-12 DIAGNOSIS — R059 Cough, unspecified: Secondary | ICD-10-CM | POA: Insufficient documentation

## 2020-06-12 DIAGNOSIS — J029 Acute pharyngitis, unspecified: Secondary | ICD-10-CM | POA: Diagnosis not present

## 2020-06-12 DIAGNOSIS — R042 Hemoptysis: Secondary | ICD-10-CM | POA: Diagnosis not present

## 2020-06-12 MED ORDER — ALBUTEROL SULFATE HFA 108 (90 BASE) MCG/ACT IN AERS: 2.0000 | INHALATION_SPRAY | Freq: Four times a day (QID) | RESPIRATORY_TRACT | 0 refills | Status: DC | PRN

## 2020-06-12 MED ORDER — CEFDINIR 300 MG PO CAPS: 300.0000 mg | ORAL_CAPSULE | Freq: Two times a day (BID) | ORAL | 0 refills | Status: DC

## 2020-06-12 MED ORDER — PROMETHAZINE-DM 6.25-15 MG/5ML PO SYRP: 5.0000 mL | ORAL_SOLUTION | Freq: Four times a day (QID) | ORAL | 0 refills | Status: DC | PRN

## 2020-06-12 NOTE — Progress Notes (Addendum)
Date:  06/12/2020   Name:  Susan Hoffman   DOB:  1990-11-02   MRN:  [id number]   Chief Complaint: Cough (Cough - green and yellow mucous. Cough is worse when exercising and coughing up blood at times. Sore throat, congestion. Exercise a lot and cannot run more than a mile without "feeling like I'm gonna die."   Short of breathe more than usual. Last Covid test was at Home and it was negative.)  Cough This is a new problem. The problem occurs every few minutes. The cough is productive of sputum and productive of blood-tinged sputum. Associated symptoms include chest pain, shortness of breath and wheezing. Pertinent negatives include no chills, fever or headaches. The symptoms are aggravated by exercise, lying down and cold air. She has tried OTC cough suppressant for the symptoms. The treatment provided mild relief. Her past medical history is significant for pneumonia.    Lab Results  Component Value Date   CREATININE 0.98 06/27/2019   BUN 9 06/27/2019   NA 138 06/27/2019   K 4.9 06/27/2019   CL 103 06/27/2019   CO2 22 06/27/2019   Lab Results  Component Value Date   CHOL 147 06/27/2019   HDL 70 06/27/2019   LDLCALC 69 06/27/2019   TRIG 31 06/27/2019   CHOLHDL 2.1 06/27/2019   Lab Results  Component Value Date   TSH 1.100 01/27/2020   No results found for: HGBA1C Lab Results  Component Value Date   WBC 7.0 06/27/2019   HGB 14.6 06/27/2019   HCT 44.7 06/27/2019   MCV 94 06/27/2019   PLT 356 06/27/2019   Lab Results  Component Value Date   ALT 10 06/27/2019   AST 16 06/27/2019   ALKPHOS 60 06/27/2019   BILITOT 0.5 06/27/2019     Review of Systems  Constitutional: Positive for fatigue. Negative for chills, fever and unexpected weight change.  Respiratory: Positive for cough, chest tightness, shortness of breath and wheezing.   Cardiovascular: Positive for chest pain. Negative for palpitations and leg swelling.  Gastrointestinal: Negative for diarrhea and  vomiting.  Neurological: Negative for dizziness, light-headedness and headaches.  Psychiatric/Behavioral: Positive for sleep disturbance (due to cough).    Patient Active Problem List   Diagnosis Date Noted  . Ankle pain, right 10/11/2019  . Abnormal Pap smear of cervix 02/13/2017  . Attention deficit hyperactivity disorder (ADHD), predominantly hyperactive type 02/13/2017  . 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 Covid-19 (Mrna) Vaccine Anaphylaxis, Other (See Comments) and Shortness Of Breath  . Codeine   . Mushroom Extract Complex     Past Surgical History:  Procedure Laterality Date  . nexplanon  2017  . TYMPANOPLASTY    . wisdom teeth removal      Social History   Tobacco Use  . Smoking status: Never Smoker  . Smokeless tobacco: Never Used  Vaping Use  . Vaping Use: Never used  Substance Use Topics  . Alcohol use: Yes    Alcohol/week: 2.0 standard drinks    Types: 1 Glasses of wine, 1 Cans of beer per week    Comment: wine weekly  . Drug use: No     Medication list has been reviewed and updated.  Current Meds  Medication Sig  . Levonorgest-Eth Estrad-Fe Bisg (BALCOLTRA) 0.1-20 MG-MCG(21) TABS Take 1 tablet by mouth daily.  . Multiple Vitamins-Minerals (MULTI ADULT GUMMIES) CHEW Chew by mouth.  PHQ 2/9 Scores 06/12/2020 01/27/2020 06/27/2019 06/23/2018  PHQ - 2 Score 0 1 0 0  PHQ- 9 Score 0 3 3 0    GAD 7 : Generalized Anxiety Score 06/12/2020 01/27/2020  Nervous, Anxious, on Edge 0 0  Control/stop worrying 0 1  Worry too much - different things 0 0  Trouble relaxing 0 1  Restless 0 0  Easily annoyed or irritable 0 1  Afraid - awful might happen 0 0  Total GAD 7 Score 0 3  Anxiety Difficulty Not difficult at all -    BP Readings from Last 3 Encounters:  06/12/20 124/78  05/25/20 138/68  04/13/20 111/80    Physical Exam Constitutional:      General: She is in acute distress (due  to cough).  HENT:     Right Ear: Ear canal normal.     Left Ear: Tympanic membrane and ear canal normal.     Ears:     Comments: Right perforated TM    Mouth/Throat:     Mouth: Mucous membranes are moist.     Pharynx: Posterior oropharyngeal erythema present.  Cardiovascular:     Rate and Rhythm: Normal rate and regular rhythm.     Pulses: Normal pulses.  Pulmonary:     Effort: Pulmonary effort is normal.     Breath sounds: Examination of the right-middle field reveals decreased breath sounds. Decreased breath sounds present. No wheezing, rhonchi or rales.  Musculoskeletal:     Cervical back: Normal range of motion.  Lymphadenopathy:     Cervical: No cervical adenopathy.  Skin:    General: Skin is warm and dry.     Capillary Refill: Capillary refill takes less than 2 seconds.  Neurological:     General: No focal deficit present.     Mental Status: She is alert.  Psychiatric:        Mood and Affect: Mood normal.     Wt Readings from Last 3 Encounters:  06/12/20 153 lb (69.4 kg)  05/25/20 153 lb 6.4 oz (69.6 kg)  04/13/20 149 lb 11.2 oz (67.9 kg)    BP 124/78   Pulse 61   Temp 97.9 F (36.6 C) (Oral)   Ht 5\' 2"  (1.575 m)   Wt 153 lb (69.4 kg)   LMP 06/12/2020 (Exact Date)   SpO2 98%   BMI 27.98 kg/m   Assessment and Plan: 1. Cough Concerning for pneumonia following non-covid URI 8 Covid tests negative in the past three weeks - DG Chest 2 View; Future - albuterol (VENTOLIN HFA) 108 (90 Base) MCG/ACT inhaler; Inhale 2 puffs into the lungs every 6 (six) hours as needed for wheezing or shortness of breath.  Dispense: 1 each; Refill: 0 - cefdinir (OMNICEF) 300 MG capsule; Take 1 capsule (300 mg total) by mouth 2 (two) times daily for 10 days.  Dispense: 20 capsule; Refill: 0 - promethazine-dextromethorphan (PROMETHAZINE-DM) 6.25-15 MG/5ML syrup; Take 5 mLs by mouth 4 (four) times daily as needed for cough.  Dispense: 240 mL; Refill: 0   Partially dictated using  06-30-1990. Any errors are unintentional.  Animal nutritionist, MD [facility]  06/12/2020

## 2020-06-28 ENCOUNTER — Other Ambulatory Visit: Payer: Self-pay

## 2020-06-28 ENCOUNTER — Encounter: Payer: Self-pay | Admitting: Internal Medicine

## 2020-06-28 ENCOUNTER — Other Ambulatory Visit (HOSPITAL_COMMUNITY)
Admission: RE | Admit: 2020-06-28 | Discharge: 2020-06-28 | Disposition: A | Discharge status: Home / Self Care | Payer: 59 | Source: Ambulatory Visit | Attending: Internal Medicine | Admitting: Internal Medicine

## 2020-06-28 ENCOUNTER — Ambulatory Visit (INDEPENDENT_AMBULATORY_CARE_PROVIDER_SITE_OTHER): Payer: 59 | Admitting: Internal Medicine

## 2020-06-28 VITALS — BP 110/72 | HR 67 | Temp 98.1°F | Ht 62.0 in | Wt 156.0 lb

## 2020-06-28 DIAGNOSIS — Z0000 Encounter for general adult medical examination without abnormal findings: Secondary | ICD-10-CM

## 2020-06-28 DIAGNOSIS — Z124 Encounter for screening for malignant neoplasm of cervix: Secondary | ICD-10-CM

## 2020-06-28 DIAGNOSIS — R635 Abnormal weight gain: Secondary | ICD-10-CM | POA: Diagnosis not present

## 2020-06-28 NOTE — Progress Notes (Signed)
Date:  06/28/2020   Name:  Susan Hoffman   DOB:  02-27-1991   MRN:  [id number]   Chief Complaint: Annual Exam (Breast exam with pap)  Susan Hoffman is a 30 y.o. female who presents today for her Complete Annual Exam. She feels well. She reports exercising X5 days a week lift weights and cardio. She reports she is sleeping fairly well. Breast complaints none. She has stopped Adderall and is cutting back on her OCPs in anticipation of starting a family.  Mammogram: not due Pap smear: 06/23/2017 negative thin prep Colonoscopy: not due  Immunization History  Administered Date(s) Administered  . Influenza-Unspecified 02/10/2018, 02/15/2019, 01/27/2020  . PFIZER(Purple Top)SARS-COV-2 Vaccination 04/29/2019, 05/19/2019, 03/09/2020  . Tdap 06/23/2018    HPI Weight gain - she is discouraged by her recent weight gain.  She follows a healthy diet and exercises regularly.  Her husband has been on the same program and has been able to lose.  She has gained about 15 lbs in the past 6 months.  She does not think that stopping the Adderall has contributed but that is the most likely reason.  Lab Results  Component Value Date   CREATININE 0.98 06/27/2019   BUN 9 06/27/2019   NA 138 06/27/2019   K 4.9 06/27/2019   CL 103 06/27/2019   CO2 22 06/27/2019   Lab Results  Component Value Date   CHOL 147 06/27/2019   HDL 70 06/27/2019   LDLCALC 69 06/27/2019   TRIG 31 06/27/2019   CHOLHDL 2.1 06/27/2019   Lab Results  Component Value Date   TSH 1.100 01/27/2020   No results found for: HGBA1C Lab Results  Component Value Date   WBC 7.0 06/27/2019   HGB 14.6 06/27/2019   HCT 44.7 06/27/2019   MCV 94 06/27/2019   PLT 356 06/27/2019   Lab Results  Component Value Date   ALT 10 06/27/2019   AST 16 06/27/2019   ALKPHOS 60 06/27/2019   BILITOT 0.5 06/27/2019     Review of Systems  Constitutional: Positive for unexpected weight change (140 here last year, now 156).  Negative for chills, fatigue and fever.  HENT: Negative for congestion, hearing loss, tinnitus, trouble swallowing and voice change.   Eyes: Negative for visual disturbance.  Respiratory: Negative for cough, chest tightness, shortness of breath and wheezing.   Cardiovascular: Negative for chest pain, palpitations and leg swelling.  Gastrointestinal: Negative for abdominal pain, constipation, diarrhea and vomiting.  Endocrine: Negative for polydipsia and polyuria.  Genitourinary: Negative for dysuria, frequency, genital sores, vaginal bleeding and vaginal discharge.  Musculoskeletal: Negative for arthralgias, gait problem and joint swelling.  Skin: Negative for color change and rash.  Neurological: Negative for dizziness, tremors, light-headedness and headaches.  Hematological: Negative for adenopathy. Does not bruise/bleed easily.  Psychiatric/Behavioral: Negative for dysphoric mood and sleep disturbance. The patient is not nervous/anxious.     Patient Active Problem List   Diagnosis Date Noted  . Ankle pain, right 10/11/2019  . Abnormal Pap smear of cervix 02/13/2017  . Attention deficit hyperactivity disorder (ADHD), predominantly hyperactive type 02/13/2017  . 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 Covid-19 (Mrna) Vaccine Anaphylaxis, Other (See Comments) and Shortness Of Breath  . Codeine   . Mushroom Extract Complex     Past Surgical History:  Procedure Laterality Date  . nexplanon  2017  . TYMPANOPLASTY    . wisdom teeth  removal      Social History   Tobacco Use  . Smoking status: Never Smoker  . Smokeless tobacco: Never Used  Vaping Use  . Vaping Use: Never used  Substance Use Topics  . Alcohol use: Yes    Alcohol/week: 2.0 standard drinks    Types: 1 Glasses of wine, 1 Cans of beer per week    Comment: wine weekly  . Drug use: No     Medication list has been reviewed and updated.  Current  Meds  Medication Sig  . albuterol (VENTOLIN HFA) 108 (90 Base) MCG/ACT inhaler Inhale 2 puffs into the lungs every 6 (six) hours as needed for wheezing or shortness of breath.  Clelia Schaumann Estrad-Fe Bisg (BALCOLTRA) 0.1-20 MG-MCG(21) TABS Take 1 tablet by mouth daily.  . Multiple Vitamins-Minerals (MULTI ADULT GUMMIES) CHEW Chew by mouth.    PHQ 2/9 Scores 06/28/2020 06/12/2020 01/27/2020 06/27/2019  PHQ - 2 Score 0 0 1 0  PHQ- 9 Score 0 0 3 3    GAD 7 : Generalized Anxiety Score 06/28/2020 06/12/2020 01/27/2020  Nervous, Anxious, on Edge 0 0 0  Control/stop worrying 0 0 1  Worry too much - different things 0 0 0  Trouble relaxing 0 0 1  Restless 0 0 0  Easily annoyed or irritable 0 0 1  Afraid - awful might happen 0 0 0  Total GAD 7 Score 0 0 3  Anxiety Difficulty - Not difficult at all -    BP Readings from Last 3 Encounters:  06/28/20 110/72  06/12/20 124/78  05/25/20 138/68    Physical Exam Vitals and nursing note reviewed.  Constitutional:      General: She is not in acute distress.    Appearance: She is well-developed.  HENT:     Head: Normocephalic and atraumatic.     Right Ear: Tympanic membrane and ear canal normal.     Left Ear: Tympanic membrane and ear canal normal.     Nose:     Right Sinus: No maxillary sinus tenderness.     Left Sinus: No maxillary sinus tenderness.  Eyes:     General: No scleral icterus.       Right eye: No discharge.        Left eye: No discharge.     Conjunctiva/sclera: Conjunctivae normal.  Neck:     Thyroid: No thyromegaly.     Vascular: No carotid bruit.  Cardiovascular:     Rate and Rhythm: Normal rate and regular rhythm.     Pulses: Normal pulses.     Heart sounds: Normal heart sounds.  Pulmonary:     Effort: Pulmonary effort is normal. No respiratory distress.     Breath sounds: No wheezing.  Chest:  Breasts:     Right: No mass, nipple discharge, skin change or tenderness.     Left: No mass, nipple discharge, skin change  or tenderness.    Abdominal:     General: Bowel sounds are normal.     Palpations: Abdomen is soft.     Tenderness: There is no abdominal tenderness.  Genitourinary:    Labia:        Right: No rash, tenderness, lesion or injury.        Left: No rash, tenderness, lesion or injury.      Vagina: Normal.     Cervix: No erythema or cervical bleeding.     Uterus: Normal.      Adnexa: Right adnexa normal and left adnexa  normal.  Musculoskeletal:     Cervical back: Normal range of motion. No erythema.     Right lower leg: No edema.     Left lower leg: No edema.  Lymphadenopathy:     Cervical: No cervical adenopathy.  Skin:    General: Skin is warm and dry.     Capillary Refill: Capillary refill takes less than 2 seconds.     Findings: No rash.  Neurological:     Mental Status: She is alert and oriented to person, place, and time.     Cranial Nerves: No cranial nerve deficit.     Sensory: No sensory deficit.     Deep Tendon Reflexes: Reflexes are normal and symmetric.  Psychiatric:        Attention and Perception: Attention normal.        Mood and Affect: Mood normal.     Wt Readings from Last 3 Encounters:  06/28/20 156 lb (70.8 kg)  06/12/20 153 lb (69.4 kg)  05/25/20 153 lb 6.4 oz (69.6 kg)    BP 110/72   Pulse 67   Temp 98.1 F (36.7 C) (Oral)   Ht 5\' 2"  (1.575 m)   Wt 156 lb (70.8 kg)   LMP 06/12/2020 (Exact Date)   SpO2 97%   BMI 28.53 kg/m   Assessment and Plan: 1. Annual physical exam Normal exam Continue healthy diet and exercise - CBC with Differential/Platelet - Comprehensive metabolic panel - Lipid panel  2. Encounter for screening for cervical cancer Pap obtained - Cytology - PAP  3. Weight gain finding Will check thyroid - suspect the Adderall was helping her maintain a lower weight - TSH + free T4   Partially dictated using 06/14/2020. Any errors are unintentional.  Animal nutritionist, MD [facility]  06/28/2020

## 2020-06-29 LAB — COMPREHENSIVE METABOLIC PANEL
ALT: 14 IU/L (ref 0–32)
AST: 22 IU/L (ref 0–40)
Albumin/Globulin Ratio: 2.1 (ref 1.2–2.2)
Albumin: 4.4 g/dL (ref 3.9–5.0)
Alkaline Phosphatase: 58 IU/L (ref 44–121)
BUN/Creatinine Ratio: 11 (ref 9–23)
BUN: 10 mg/dL (ref 6–20)
Bilirubin Total: 0.5 mg/dL (ref 0.0–1.2)
CO2: 20 mmol/L (ref 20–29)
Calcium: 9.3 mg/dL (ref 8.7–10.2)
Chloride: 103 mmol/L (ref 96–106)
Creatinine, Ser: 0.92 mg/dL (ref 0.57–1.00)
Globulin, Total: 2.1 g/dL (ref 1.5–4.5)
Glucose: 73 mg/dL (ref 65–99)
Potassium: 4.7 mmol/L (ref 3.5–5.2)
Sodium: 139 mmol/L (ref 134–144)
Total Protein: 6.5 g/dL (ref 6.0–8.5)
eGFR: 86 mL/min/{1.73_m2} (ref >59)

## 2020-06-29 LAB — CBC WITH DIFFERENTIAL/PLATELET
Basophils Absolute: 0 10*3/uL (ref 0.0–0.2)
Basos: 1 %
EOS (ABSOLUTE): 0.1 10*3/uL (ref 0.0–0.4)
Eos: 2 %
Hematocrit: 42.6 % (ref 34.0–46.6)
Hemoglobin: 14.5 g/dL (ref 11.1–15.9)
Immature Grans (Abs): 0 10*3/uL (ref 0.0–0.1)
Immature Granulocytes: 0 %
Lymphocytes Absolute: 2.1 10*3/uL (ref 0.7–3.1)
Lymphs: 30 %
MCH: 31.9 pg (ref 26.6–33.0)
MCHC: 34 g/dL (ref 31.5–35.7)
MCV: 94 fL (ref 79–97)
Monocytes Absolute: 0.4 10*3/uL (ref 0.1–0.9)
Monocytes: 5 %
Neutrophils Absolute: 4.2 10*3/uL (ref 1.4–7.0)
Neutrophils: 62 %
Platelets: 314 10*3/uL (ref 150–450)
RBC: 4.55 x10E6/uL (ref 3.77–5.28)
RDW: 11.7 % (ref 11.7–15.4)
WBC: 6.8 10*3/uL (ref 3.4–10.8)

## 2020-06-29 LAB — CYTOLOGY - PAP
Adequacy: ABSENT
Comment: NEGATIVE
Diagnosis: NEGATIVE
High risk HPV: NEGATIVE

## 2020-06-29 LAB — LIPID PANEL
Chol/HDL Ratio: 3 ratio (ref 0.0–4.4)
Cholesterol, Total: 166 mg/dL (ref 100–199)
HDL: 56 mg/dL (ref >39)
LDL Chol Calc (NIH): 97 mg/dL (ref 0–99)
Triglycerides: 64 mg/dL (ref 0–149)
VLDL Cholesterol Cal: 13 mg/dL (ref 5–40)

## 2020-06-29 LAB — TSH+FREE T4
Free T4: 0.98 ng/dL (ref 0.82–1.77)
TSH: 0.867 u[IU]/mL (ref 0.450–4.500)

## 2020-07-10 DIAGNOSIS — S86111A Strain of other muscle(s) and tendon(s) of posterior muscle group at lower leg level, right leg, initial encounter: Secondary | ICD-10-CM | POA: Diagnosis not present

## 2020-07-10 DIAGNOSIS — X500XXA Overexertion from strenuous movement or load, initial encounter: Secondary | ICD-10-CM | POA: Diagnosis not present

## 2020-07-10 DIAGNOSIS — Y9366 Activity, soccer: Secondary | ICD-10-CM | POA: Diagnosis not present

## 2020-07-12 ENCOUNTER — Ambulatory Visit: Payer: 59 | Attending: Physician Assistant

## 2020-07-12 ENCOUNTER — Other Ambulatory Visit: Payer: Self-pay

## 2020-07-12 DIAGNOSIS — M6281 Muscle weakness (generalized): Secondary | ICD-10-CM | POA: Insufficient documentation

## 2020-07-12 DIAGNOSIS — M79661 Pain in right lower leg: Secondary | ICD-10-CM | POA: Diagnosis not present

## 2020-07-12 NOTE — Patient Instructions (Addendum)
Access Code: [email or web address] URL: [email or web address] Date: 07/12/2020 Prepared by: Ria Comment  Exercises Ankle Alphabet in Elevation - 3 x daily - 7 x weekly - 1 sets Seated Gastroc Stretch with Strap - 2 x daily - 7 x weekly - 3 reps - 45-60s hold Seated Soleus Stretch with Strap - 2 x daily - 7 x weekly - 3 reps - 45-60s hold  Patient Education Calf Strain

## 2020-07-12 NOTE — Therapy (Signed)
[facility] [address] [place], [place], [address] Phone: [phone]   Fax:  [phone]  Physical Therapy Evaluation  Patient Details  Name: Susan Hoffman MRN: [id number] Date of Birth: December 08, 1990 Referring Provider (PT): Mertie Moores   Encounter Date: 07/12/2020   PT End of Session - 07/13/20 2200    Visit Number 1    Number of Visits 17    Date for PT Re-Evaluation 09/06/20    Authorization Type eval: 07/12/20    PT Start Time 0845    PT Stop Time 0945    PT Time Calculation (min) 60 min    Activity Tolerance Patient tolerated treatment well    Behavior During Therapy [facility] for tasks assessed/performed           Past Medical History:  Diagnosis Date  . Abnormal cells of cervix 2016   Was told needs PAP every 6 months due to abnormal cells.   . Acquired deafness of right ear 2015  . ADHD     Past Surgical History:  Procedure Laterality Date  . nexplanon  2017  . TYMPANOPLASTY    . wisdom teeth removal      There were no vitals filed for this visit.    Subjective Assessment - 07/13/20 2153    Subjective R calf strain    Patient is accompained by: Family member   Husband   Pertinent History Susan Hoffman is an 30 y.o. female referred by orthopedics for partially torn medial gastroc. Pt was playing soccer on 07/10/20 when she she pushed off to start running and felt a pop in her calf. She could not put weight on her RLE and had to be assisted off the field. She went to walk-in orthopedics who diagnosed her with a partial gastroc tear, placed her in a walking boot with a heel lift, and advised her to be WBAT utilizing crutches to walk as needed. She is currently out of work and reports that her employer told her she can return once she is able to ambulate without the crutches. She has continued to have difficulty with weightbearing since that time however is able to walk short distances without her crutches. She has  also needed a double heel lift in her walking boot to help decrease pain in her calf. Her husband has provided extensive assistance around the house with IADLs and helps her in/out of the shower due to a small step to enter/exit. She has a prior history of R ankle fracture approximately 2 years ago which resulted in some persistent R foot numbness which continues. 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 She is currently tapering down off OCP in hopes of trying to get pregnant in the near future. Otherwise she denies any recent changes in health.    Limitations Walking;Standing;House hold activities    How long can you walk comfortably? <20'    Diagnostic tests None    Patient Stated Goals Decrease pain and be able to ambulate sufficiently to return to work    Currently in Pain? Yes    Pain Score 1    Best: 0/10, Worst: 9/10   Pain Location Calf    Pain Orientation Right    Pain Descriptors / Indicators Aching;Throbbing    Pain Type Acute pain    Pain Radiating Towards None    Pain Onset In the past 7 days    Pain Frequency Intermittent  Aggravating Factors  AROM of R ankle    Pain Relieving Factors ice, ibuprofen    Effect of Pain on Daily Activities Pt is WBAT and ambulating with crutches which prevents her from working and limits her ability to participate in IADLs around the house    Multiple Pain Sites No              [facility] PT Assessment - 07/13/20 2157      Assessment   Medical Diagnosis Partially torn medial gastroc muscle    Referring Provider (PT) Mertie Moores    Onset Date/Surgical Date 07/10/20    Next MD Visit Not reported    Prior Therapy Not for this issue      Precautions   Required Braces or Orthoses Other Brace/Splint    Other Brace/Splint Pneumatic walking boot RLE      Restrictions   Weight Bearing Restrictions Yes    RLE Weight Bearing Weight bearing as tolerated    Other Position/Activity Restrictions Currently utilizing  crutches      Balance Screen   Has the patient had a decrease in activity level because of a fear of falling?  Yes    Is the patient reluctant to leave their home because of a fear of falling?  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 female referred by orthopedics for partially torn medial gastroc. Pt was playing soccer on 07/10/20 when she she pushed off to start running and felt a pop in her calf. She could not put weight on her RLE and had to be assisted off the field. She went to walk-in orthopedics who diagnosed her with a partial gastroc tear, placed her in a walking boot with a heel lift, and advised her to be WBAT utilizing crutches to walk as needed. She is currently out of work and reports that her employer told her she can return once she is able to ambulate without the crutches. She has continued to have difficulty with weightbearing since that time however is able to walk short distances without her crutches. She has also needed a double heel lift in her walking boot to help decrease pain in her calf. Her husband has provided extensive assistance around the house with IADLs and helps her in/out of the shower due to a small step to enter/exit. She has a prior history of R ankle fracture approximately 2 years ago which resulted in some persistent R foot numbness which  continues. 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 She is currently tapering down off OCP in hopes of trying to get pregnant in the near future. Otherwise she denies any recent changes in health.  Pain location: R proximal  medial gastroc Pain: Present 1/10, Best 0/10, Worst 9/10 Pain quality: aching, throbbing Radiating pain: No  Numbness/Tingling: Yes, chronic s/p ankle fracture 2 years ago 24 hour pain behavior: No change Aggravating factors: AROM, worse in dependent position Easing factors: ice, ibuprofen History of back, hip, or knee pain: Yes, R MCL (partial tear), LCL (partial tear), and meniscal injury (non surgical management for all) Precautions/WB Restrictions: Yes, WBAT in walking boot Next MD Visit: Not reported Imaging: No  Typical footwear: Sneakers at work   OBJECTIVE  MUSCULOSKELETAL: Tremor: Absent Bulk: Normal Tone: Normal, no clonus No trophic changes noted to foot/ankle or calf. Mild swelling noted in R ankle posterior and inferior to medial and lateral malleoli but no notable swelling mid-belly of calf on the R side. No visible defect in calf muscle or achilles tendon. Circumferential measurements taken on both sides 11cm distal to tibial tuberosity: RLE: 37cm, LLE: 37.5cm   Lumbar/Hip/Knee Screen Full screening deferred. Pt denies any deficits in R knee ROM or reported R knee pain  Posture Pt in R walking boot but otherwise no gross deficits in sitting or standing posture.   Gait Pt with R walking boot with double heel lift. She is only able to ambulate a very short distance, approximately 5', in the exam room. Very guarded weight shifting to RLE with significantly antalgic gait and decreased stance time on RLE. Increase in RLE pain reported.   Palpation No pain to palpation along medial and lateral malleoli. No pain over anterior or posterior ankle. Achilles tendon intact and painless to palpation. CFL and ATF intact and  painless. She is exquisitely tender R proximal medial gastroc. Mild swelling noted in R ankle posterior and inferior to medial and lateral malleoli but no notable swelling mid-belly of calf on the R side. No palpable defect in R calf or achilles tendon.    Strength R/L 5/5 Hip flexion 5/5 Hip external rotation 5/5 Hip internal rotation 5/5 Knee extension 4+/4+ Knee flexion Present but limited by pain/5 Ankle Plantarflexion 5/5 Ankle Dorsiflexion 5/5 Ankle Inversion 5/5 Ankle Eversion  AROM R/L 50*/67 Ankle Plantarflexion -17*/20 Ankle Dorsiflexion 30/38 Ankle Inversion 22/22 Ankle Eversion *Indicates Pain  Passive Accessory Motion Talocrural Joint Distraction: WNL Talocrural Joint AP: WNL Talocrural Joint PA: WNL   NEUROLOGICAL:  Mental Status Patient is oriented to person, place and time.  Recent memory is intact.  Remote memory is intact.  Attention span and concentration are intact.  Expressive speech is intact.  Patient's fund of knowledge is within normal limits for educational level.  Sensation Deferred  Reflexes Deferred  VASCULAR Deferred   SPECIAL TESTS  RLE:  Ligamentous Integrity Anterior Drawer (ATF, 10-15 plantarflexion with anterior translation): Negative Talar Tilt (CFL, inversion): Negative Eversion Stress Test (Deltoid, eversion): Negative External Rotation Test (High ankle, dorsiflexion and external rotation): Negative Squeeze Test (High ankle): Negative Impingment Sign (Dorsiflexion and eversion): Not examined  Achilles Integrity Thompson Test: Negative  Fracture Screening Metatarsal Axial Loading: Negative Tap/Percussion Test: Not examined Vibration Test: Not examined  Pronation/Supination Navicular Drop: Not examined  Nerve Test Tarsal Tunnel Test (maximal DF, EV, toe ext with tapping over tarsal tunnel): Not examined Test for Morton's Neuroma (compress metatarsals and mobilize): Not examined  Other Windlass Mechanism  Test: Not examined            Objective measurements completed on examination: See above findings.      TREATMENT  Manual Therapy  Very gentle/light IASTM with Edge tool working distal to proximal to decrease  swelling, stimulate blood flow, and provide neural input; Kinesiotape applied to R posterior calf from distal to proximal starting at met heads and stretching across bottom of foot up to mid calf with 90% pull. 2 additional strips applied to posteromedial and posterolateral calf starting posterior to medial and lateral malleoli respectively and stretching distal to proximal up to proximal calf just below popliteal fossa with slight "c" curve toward more proximal end using 80% pull; Pt provided written HEP with focus on pain-free AROM of R ankle and progressing to pain-free passive gastroc and soleus stretches as pain will allow.         PT Education - 07/13/20 2200    Education Details Plan of care, ice and anti-inflammatories as need per ortho, elevation, HEP    Person(s) Educated Patient;Spouse    Methods Explanation;Handout    Comprehension Verbalized understanding            PT Short Term Goals - 07/13/20 2213      PT SHORT TERM GOAL #1   Title Pt will be independent with HEP in order to decrease R calf pain and increase strength in order to improve pain-free function at home and work.    Time 4    Period Weeks    Status New    Target Date 08/09/20             PT Long Term Goals - 07/13/20 2213      PT LONG TERM GOAL #1   Title Pt will decrease worst R calf pain as reported on NPRS by at least 3 points in order to demonstrate clinically significant reduction in calf pain so she can return to work/full responsibilities at home with less pain    Baseline 07/12/20: worst: 9/10    Time 8    Status New    Target Date 09/06/20      PT LONG TERM GOAL #2   Title Pt will increase LEFS to >70/80 in order to demonstrate significant improvement in lower  extremity function in order to return to work/full responsibilities at home as well as resume playing sooccer    Baseline 07/12/20: 14    Time 8    Period Weeks    Status New    Target Date 09/06/20      PT LONG TERM GOAL #3   Title Pt will improve R ankle dorsiflexion AROM in sitting to within 5 degrees of LLE without pain in order to normalize running mechanics while playing soccer    Baseline 07/12/20: AROM dorsiflexion R/L: -17/20    Time 8    Period Weeks    Status New    Target Date 09/06/20      PT LONG TERM GOAL #4   Title Pt will improve FOTO to at least 70 in order to demonstrates significant improvement in function related to R calf so that she can return to work/full responsibilities at home as well as resume playing soccer    Baseline 07/12/20: 24    Time 8    Period Weeks    Status New    Target Date 09/06/20                  Plan - 07/13/20 2200    Clinical Impression Statement Pt is a pleasant 30 year-old female referred R calf pain s/p calf strain. PT examination reveals exquisitely tender R proximal medial gastroc. Mild swelling noted in R ankle posterior and inferior to medial and lateral malleoli  but no notable swelling mid-belly of calf on the R side. No palpable defect in R calf or achilles tendon. Thompson Test is negative and pt is able to perform AROM of R ankle in all planes however with obvious guarding and pain. Her AROM is limited secondary to pain in plantarflexion, dorsiflexion and inversion. Good R ankle isometric strength testing at neutral however limited in plantarflexion secondary to pain. No obvious signs of ligamentous laxity in R ankle compared to L ankle and no reports of pain during special tests. Her gait is severely limited due to hesitancy accepting weight on RLE in walking boot secondary to pain. Pt will benefit from PT services to address deficits in strength, mobility, and pain in order to return to full function at home and work with R  calf pain.    Personal Factors and Comorbidities Comorbidity 2;Past/Current Experience;Time since onset of injury/illness/exacerbation    Comorbidities Prior R ankle fracture, chronic R ankle instability    Examination-Activity Limitations Bathing;Caring for Others;Carry;Locomotion Level;Squat;Stairs;Stand;Transfers    Examination-Participation Restrictions Cleaning;Community Activity;Driving;Laundry;Meal Prep;Occupation;Yard Work    Biomedical scientist Low    Rehab Potential Excellent    PT Frequency 2x / week    PT Duration 8 weeks    PT Treatment/Interventions ADLs/Self Care Home Management;Aquatic Therapy;Biofeedback;Canalith Repostioning;Cryotherapy;Electrical Stimulation;Iontophoresis 52m/ml Dexamethasone;Moist Heat;Traction;Ultrasound;DME Instruction;Gait training;Stair training;Functional mobility training;Therapeutic activities;Therapeutic exercise;Balance training;Neuromuscular re-education;Patient/family education;Manual techniques;Passive range of motion;Dry needling;Vestibular;Joint Manipulations    PT Next Visit Plan Continue with pain-free R ankle AROM and pain-free passive stretches of R ankle progressing toward isometric>concentric pain-free calf strengthening, utilize soft tissue techniques/modalities as needed    PT Home Exercise Plan Medbridge Access Code: [email or web address]          Patient will benefit from skilled therapeutic intervention in order to improve the following deficits and impairments:  Pain,Decreased strength,Difficulty walking  Visit Diagnosis: Pain in right lower leg  Muscle weakness (generalized)     Problem List Patient Active Problem List   Diagnosis Date Noted  . Ankle pain, right 10/11/2019  . Abnormal Pap smear of cervix 02/13/2017  . Attention deficit hyperactivity disorder (ADHD), predominantly hyperactive type 02/13/2017  . 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 Deafness in  right ear 02/13/2017   JLyndel SafeHuprich PT, DPT, GCS  Mickey Hebel 07/13/2020, 10:36 PM  [place] [facility] [place] [address]Phone: [phone]  Fax:  [phone] Name: MMellonie GuessMRN: [id number] of Birth: 105/03/1991

## 2020-08-08 ENCOUNTER — Ambulatory Visit (INDEPENDENT_AMBULATORY_CARE_PROVIDER_SITE_OTHER): Payer: 59 | Admitting: Obstetrics and Gynecology

## 2020-08-08 ENCOUNTER — Other Ambulatory Visit: Payer: Self-pay

## 2020-08-08 ENCOUNTER — Encounter: Payer: Self-pay | Admitting: Obstetrics and Gynecology

## 2020-08-08 VITALS — BP 110/74 | HR 76 | Ht 62.0 in | Wt 160.3 lb

## 2020-08-08 DIAGNOSIS — N912 Amenorrhea, unspecified: Secondary | ICD-10-CM | POA: Diagnosis not present

## 2020-08-08 DIAGNOSIS — R102 Pelvic and perineal pain: Secondary | ICD-10-CM

## 2020-08-08 NOTE — Patient Instructions (Signed)
Secondary Amenorrhea  Secondary amenorrhea occurs when a female who was previously having menstrual periods has not had them for 3-6 months. A menstrual period is the monthly shedding of the lining of the uterus. The lining of the uterus is made up of blood, tissue, fluid, and mucus. The flow of blood usually occurs during 3-7 consecutive days each month. This condition has many causes. In many cases, treating the underlying cause will return menstrual periods back to a normal cycle. What are the causes? The most common cause of this condition is pregnancy. Other medical conditions that can cause secondary amenorrhea include:  Cirrhosis of the liver.  Conditions of the blood.  Diabetes.  Epilepsy.  Chronic kidney disease.  Polycystic ovary disease.  A hormonal imbalance.  Ovarian failure.  Cystic fibrosis.  Early menopause.  Cushing syndrome.  Thyroid problems. Other causes may include:  Malnutrition.  Stress or anxiety.  Medicines.  Extreme obesity.  Low body weight or drastic weight loss.  Removal of the ovaries or uterus.  Contraceptive pills, patches, or vaginal rings. What increases the risk? You are more likely to develop this condition if:  You have a family history of this condition.  You have an eating disorder.  You do extreme athletic training.  You have a chronic disease.  You abuse substances such as alcohol or cigarettes. What are the signs or symptoms? The main symptom of this condition is a lack of menstrual periods for 3-6 months in a female who previously had menstrual periods. How is this diagnosed? This condition may be diagnosed based on:  Your medical history.  A physical exam.  A pelvic exam to check for problems with your reproductive organs.  A procedure to examine the uterus.  A measurement of your body mass index (BMI). You may also have other tests, including:  Blood tests that measure certain hormones in your body  and rule out pregnancy.  Urine tests.  Imaging tests, such as an ultrasound, CT scan, or MRI. How is this treated? Treatment for this condition depends on the cause of the amenorrhea. It may involve:  Correcting diet-related problems.  Treating underlying conditions.  Medicines.  Lifestyle changes.  Surgery. If the condition cannot be corrected, it is sometimes possible to start menstrual periods with medicines. Follow these instructions at home: Lifestyle  Maintain a healthy diet. In general, a healthy diet includes lots of fruits and vegetables, low-fat dairy products, lean meats, and foods that contain fiber. Ask to meet with a registered dietitian for nutrition counseling and meal planning.  Maintain a healthy weight. Talk to your health care provider before trying any new diet or exercise plan.  Exercise at least 30 minutes 5 or more days each week. Exercising includes brisk walking, yard work, biking, running, swimming, and team sports like basketball and soccer. Ask your health care provider which exercises are safe for you.  Get enough sleep. Plan your sleep time to allow for 7-9 hours of sleep each night.  Learn to manage stress. Explore relaxation techniques such as meditation, journaling, yoga, or tai chi.      General instructions  Be aware of changes in your menstrual cycle. Keep a record of when you have your menstrual period. Note the date your period starts, how long it lasts, and any problems you experience.  Take over-the-counter and prescription medicines only as told by your health care provider.  Keep all follow-up visits. This is important. Contact a health care provider if:  Your periods   do not return to normal after treatment. Summary  Secondary amenorrhea is when a female who was previously having menstrual periods has not gotten her period for 3-6 months.  This condition has many causes. In many cases, treating the underlying cause will return  menstrual periods back to a normal cycle.  Talk to your health care provider if your periods do not return to normal after treatment. This information is not intended to replace advice given to you by your health care provider. Make sure you discuss any questions you have with your health care provider. Document Revised: 11/30/2019 Document Reviewed: 11/30/2019 Elsevier Patient Education  2021 Elsevier Inc.  

## 2020-08-08 NOTE — Progress Notes (Signed)
Pt present due to cycle  issues after stopping OCP. Pt c/o abd pain, weight pain, last cycles was really light, stopped OCP x 3 weeks ago.

## 2020-08-08 NOTE — Progress Notes (Signed)
     GYNECOLOGY PROGRESS NOTE  Subjective:    Patient ID: Almee Pelphrey, female    DOB: 1991/01/18, 30 y.o.   MRN: [id number]  HPI  Patient is a 30 y.o. G0P0000 female who presents complaints of no menstrual cycle since stopping her OCPs (was most recently on Lo-Loestrin, had been tapering down OCP dosing over past few months after discontinuing long-term Nexplanon use). Last cycle was 10 or 11 weeks ago.  Was very light.  She reports that she took pregnancy test x 2 at home 3 days ago (different brands), was negative. Has been very moody lately. Donnice has also had severe pelvic cramping, different from regular period cramps. These particular cramps come and go, and can happen at any point during her cycle. Feels like stabbing knives.   Of note, she also notes having symptoms that feel like "my thyroid might be off". She has had a recent TSH which was normal. Has a family history of hypothyroidism.    The following portions of the patient's history were reviewed and updated as appropriate: allergies, current medications, past family history, past medical history, past social history, past surgical history and problem list.  Review of Systems Pertinent items noted in HPI and remainder of comprehensive ROS otherwise negative.   Objective:   Blood pressure 110/74, pulse 76, height 5\' 2"  (1.575 m), weight 160 lb 4.8 oz (72.7 kg), last menstrual period 07/27/2020. General appearance: alert and no distress Remainder of exam deferred.    Assessment:   1. Amenorrhea   2. Pelvic pain     Plan:   1. Amenorrhea - most likely secondary to discontinuing her OCPs.  Patient ultimately desires to regulate cycles so she can conceive.  Will check hormone levels today (including BHCG due to pregnancy-like symptoms).  UPT negative today.  May benefit from Provera challenge if all other labs normal.   2. Pelvic pain - may likely be secondary to uterine activity (as this has occurred recently in  light of absent menses). She denies vaginal discharge so less likely due to infection.  Advised on use of NSAIDs/Tylenol. If still significant pain after withdrawal bleed if all other labs are negative, will consider pelvic ultrasound.  3. Patient notes feeling like her thyroid is off, having bloating, weight gain, feeling sluggish.  Recent TSH normal in February. Discussed natural remedies for helping symptoms, advised on Ashwaganda.    March, MD [facility]

## 2020-08-09 ENCOUNTER — Other Ambulatory Visit: Payer: Self-pay

## 2020-08-09 LAB — ESTRADIOL: Estradiol: 105 pg/mL

## 2020-08-09 LAB — PROLACTIN: Prolactin: 37.3 ng/mL — ABNORMAL HIGH (ref 4.8–23.3)

## 2020-08-09 LAB — FSH/LH
FSH: 5.9 m[IU]/mL
LH: 9.2 m[IU]/mL

## 2020-08-09 LAB — PROGESTERONE: Progesterone: <0.1 ng/mL

## 2020-08-09 LAB — HUMAN CHORIONIC GONADOTROPIN(HCG),B-SUBUNIT,QUANTITATIVE): HCG, Beta Chain, Quant, S: <1 m[IU]/mL

## 2020-08-09 MED ORDER — MEDROXYPROGESTERONE ACETATE 10 MG PO TABS
10.0000 mg | ORAL_TABLET | Freq: Every day | ORAL | 2 refills | Status: DC
  Filled 2020-08-09: qty 10, 10d supply, fill #0
  Filled 2020-08-30: qty 10, 10d supply, fill #1

## 2020-08-09 NOTE — Addendum Note (Signed)
Addended by: Fabian November on: 08/09/2020 05:31 PM   Modules accepted: Orders

## 2020-08-10 ENCOUNTER — Other Ambulatory Visit: Payer: Self-pay

## 2020-08-10 ENCOUNTER — Ambulatory Visit: Payer: 59

## 2020-08-16 NOTE — Patient Instructions (Incomplete)
TREATMENT   Manual Therapy  Very gentle/light IASTM with Edge tool working distal to proximal to decrease swelling, stimulate blood flow, and provide neural input; Kinesiotape applied to R posterior calf from distal to proximal starting at met heads and stretching across bottom of foot up to mid calf with 90% pull. 2 additional strips applied to posteromedial and posterolateral calf starting posterior to medial and lateral malleoli respectively and stretching distal to proximal up to proximal calf just below popliteal fossa with slight "c" curve toward more proximal end using 80% pull; Pt provided written HEP with focus on pain-free AROM of R ankle and progressing to pain-free passive gastroc and soleus stretches as pain will allow.

## 2020-08-17 ENCOUNTER — Ambulatory Visit: Payer: 59 | Attending: Physician Assistant

## 2020-08-17 ENCOUNTER — Other Ambulatory Visit: Payer: Self-pay

## 2020-08-17 DIAGNOSIS — M79661 Pain in right lower leg: Secondary | ICD-10-CM | POA: Insufficient documentation

## 2020-08-17 DIAGNOSIS — M6281 Muscle weakness (generalized): Secondary | ICD-10-CM | POA: Insufficient documentation

## 2020-08-17 NOTE — Therapy (Signed)
[facility] [address] [place], [place], [address] Phone: [phone]   Fax:  [phone]  Physical Therapy Treatment  Patient Details  Name: Susan Hoffman MRN: [id number] Date of Birth: 1991/01/19 Referring Provider (PT): Lanetta Inch   Encounter Date: 08/17/2020   PT End of Session - 08/17/20 1344    Visit Number 2    Number of Visits 17    Date for PT Re-Evaluation 09/06/20    Authorization Type eval: 07/12/20    PT Start Time 0930    PT Stop Time 1015    PT Time Calculation (min) 45 min    Activity Tolerance Patient tolerated treatment well    Behavior During Therapy [facility] for tasks assessed/performed           Past Medical History:  Diagnosis Date  . Abnormal cells of cervix 2016   Was told needs PAP every 6 months due to abnormal cells.   . Acquired deafness of right ear 2015  . ADHD     Past Surgical History:  Procedure Laterality Date  . nexplanon  2017  . TYMPANOPLASTY    . wisdom teeth removal      There were no vitals filed for this visit.   Subjective Assessment - 08/17/20 0958    Subjective Patient reports that she is doing well today.  Denies any resting calf pain.  She was able to transition off of the crutches but continues to wear the walking boot which she will start to discontinue next week per physician orders.  No specific questions or concerns at this time.    Patient is accompained by: Family member   Husband   Pertinent History Susan Hoffman is an 30 y.o. female referred by orthopedics for partially torn medial gastroc. Pt was playing soccer on 07/10/20 when she she pushed off to start running and felt a pop in her calf. She could not put weight on her RLE and had to be assisted off the field. She went to walk-in orthopedics who diagnosed her with a partial gastroc tear, placed her in a walking boot with a heel lift, and advised her to be WBAT utilizing crutches to walk as needed. She is  currently out of work and reports that her employer told her she can return once she is able to ambulate without the crutches. She has continued to have difficulty with weightbearing since that time however is able to walk short distances without her crutches. She has also needed a double heel lift in her walking boot to help decrease pain in her calf. Her husband has provided extensive assistance around the house with IADLs and helps her in/out of the shower due to a small step to enter/exit. She has a prior history of R ankle fracture approximately 2 years ago which resulted in some persistent R foot numbness which continues. 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 She is currently tapering down off OCP in hopes of trying to get pregnant in the near future. Otherwise she denies any recent changes in health.    Limitations Walking;Standing;House hold activities    How long can you walk comfortably? <20'    Diagnostic tests None    Patient Stated Goals Decrease pain and be able to ambulate sufficiently to return to work    Currently in Pain? No/denies    Pain Onset --  TREATMENT   Manual Therapy  Moist heat pack to posterior R calf to improve tissue extensibility for stretching x 10 minutes during interim history, stretching, and HEP review; Supine right ankle manual dorsiflexion stretch 30s hold x 3; Supine right ankle anterior to posterior talocrural mobilizations at available end range dorsiflexion, grade 2-3, 30 seconds/bout x 3 bouts to improve dorsiflexion range of motion; Supine right distal and proximal fibula on tibia anterior to posterior mobilizations, grade 2-3, 30 seconds/bout x 2 bouts each to improve dorsiflexion range of motion; Prone right ankle posterior to anterior talocrural mobilizations, grade 2-3, 30 seconds/bout x3 bouts to improve plantarflexion range of motion; Light IASTM to R calf starting distal and working proximal  with pt in prone to stimulate blood flow, decrease pain, provide neural input, and improve tissue extensibility. Susan Hoffman and sweeping strokes utilized. Also performed STM to plantar surface of R foot to improve tissue extensibility and reduce risk of plantar irritation while in boot. Utilzied light effleurage to calf and foot throughout.  Patient does still report some tenderness to the medial head of the gastroc proximally as well as a brief burning sensation.    Ther-ex  Right ankle circles clockwise/counterclockwise x20 each direction; Right ankle light manually resisted dorsiflexion, inversion, eversion, plantarflexion x10 each direction; Discussed WB and HEP progression as well as slowly weaning out of walking boot;   Electrical Stimulation  NMES with patient in prone using small muscle spasm setting on Empi Continuum unit at default setting (pulse rate: 35Hz , symmetrical waveform, 300 microsec pulse width, 2s ramp up/down, 10s on time/30s off time) at pt tolerable intensity of 8.0 with positive lead over medial proximal calf and negative lead on distal midline calf. Therapist blocked R ankle at neutral and provided resistance for plantarflexion contraction during "on" phase of NMES. Improved contraction noted with NMES.    Pt educated throughout session about proper posture and technique with exercises. Improved exercise technique, movement at target joints, use of target muscles after min to mod verbal, visual, tactile cues.    Patient has made significant progress with respect to her right calf pain since the initial evaluation.  She is now able to ambulate without crutches and is only in a walking boot which she will start to taper next week.  Her active range of motion is significantly improved and much less painful however she continues to have difficulty recruiting right calf muscle for plantarflexion contraction.  Performed light stretching and strengthening to right ankle in all 4  directions including NMES for neuromuscular reeducation with the hopes of improving recruitment of R calf muscle. Light IASTM to R calf performed to stimulate blood flow, decrease pain, provide neural input, and improve tissue extensibility. Patient does still report some tenderness to the medial head of the gastroc proximally as well as a brief burning sensation.  Reinforce gradual weaning schedule from boot next week as well as progressive loading out of boot on right lower extremity.  Patient encouraged to continue HEP and follow-up as scheduled.                            PT Education - 08/17/20 1343    Education Details Progression of therapy    Person(s) Educated Patient    Methods Explanation    Comprehension Verbalized understanding            PT Short Term Goals - 07/13/20 2213      PT SHORT TERM  GOAL #1   Title Pt will be independent with HEP in order to decrease R calf pain and increase strength in order to improve pain-free function at home and work.    Time 4    Period Weeks    Status New    Target Date 08/09/20             PT Long Term Goals - 07/13/20 2213      PT LONG TERM GOAL #1   Title Pt will decrease worst R calf pain as reported on NPRS by at least 3 points in order to demonstrate clinically significant reduction in calf pain so she can return to work/full responsibilities at home with less pain    Baseline 07/12/20: worst: 9/10    Time 8    Status New    Target Date 09/06/20      PT LONG TERM GOAL #2   Title Pt will increase LEFS to >70/80 in order to demonstrate significant improvement in lower extremity function in order to return to work/full responsibilities at home as well as resume playing sooccer    Baseline 07/12/20: 14    Time 8    Period Weeks    Status New    Target Date 09/06/20      PT LONG TERM GOAL #3   Title Pt will improve R ankle dorsiflexion AROM in sitting to within 5 degrees of LLE without pain in order to  normalize running mechanics while playing soccer    Baseline 07/12/20: AROM dorsiflexion R/L: -17/20    Time 8    Period Weeks    Status New    Target Date 09/06/20      PT LONG TERM GOAL #4   Title Pt will improve FOTO to at least 70 in order to demonstrates significant improvement in function related to R calf so that she can return to work/full responsibilities at home as well as resume playing soccer    Baseline 07/12/20: 24    Time 8    Period Weeks    Status New    Target Date 09/06/20                 Plan - 08/17/20 1344    Clinical Impression Statement Patient has made significant progress with respect to her right calf pain since the initial evaluation.  She is now able to ambulate without crutches and is only in a walking boot which she will start to taper next week.  Her active range of motion is significantly improved and much less painful however she continues to have difficulty recruiting right calf muscle for plantarflexion contraction.  Performed light stretching and strengthening to right ankle in all 4 directions including NMES for neuromuscular reeducation with the hopes of improving recruitment of R calf muscle. Light IASTM to R calf performed to stimulate blood flow, decrease pain, provide neural input, and improve tissue extensibility. Patient does still report some tenderness to the medial head of the gastroc proximally as well as a brief burning sensation.  Reinforce gradual weaning schedule from boot next week as well as progressive loading out of boot on right lower extremity.  Patient encouraged to continue HEP and follow-up as scheduled.    Personal Factors and Comorbidities Comorbidity 2;Past/Current Experience;Time since onset of injury/illness/exacerbation    Comorbidities Prior R ankle fracture, chronic R ankle instability    Examination-Activity Limitations Bathing;Caring for Others;Carry;Locomotion Level;Squat;Stairs;Stand;Transfers     Examination-Participation Restrictions Cleaning;Community Activity;Driving;Laundry;Meal Prep;Occupation;Pincus Badder Work  Stability/Clinical Decision Making Evolving/Moderate complexity    Rehab Potential Excellent    PT Frequency 2x / week    PT Duration 8 weeks    PT Treatment/Interventions ADLs/Self Care Home Management;Aquatic Therapy;Biofeedback;Canalith Repostioning;Cryotherapy;Electrical Stimulation;Iontophoresis 4mg /ml Dexamethasone;Moist Heat;Traction;Ultrasound;DME Instruction;Gait training;Stair training;Functional mobility training;Therapeutic activities;Therapeutic exercise;Balance training;Neuromuscular re-education;Patient/family education;Manual techniques;Passive range of motion;Dry needling;Vestibular;Joint Manipulations    PT Next Visit Plan Continue with pain-free R ankle AROM and pain-free passive stretches of R ankle, concentric pain-free calf strengthening, progressive weightbearing, utilize soft tissue techniques/modalities as needed    PT Home Exercise Plan Medbridge Access Code:           Patient will benefit from skilled therapeutic intervention in order to improve the following deficits and impairments:  Pain,Decreased strength,Difficulty walking  Visit Diagnosis: Pain in right lower leg  Muscle weakness (generalized)     Problem List Patient Active Problem List   Diagnosis Date Noted  . Ankle pain, right 10/11/2019  . Abnormal Pap smear of cervix 02/13/2017  . Attention deficit hyperactivity disorder (ADHD), predominantly hyperactive type 02/13/2017  . 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 Deafness in right ear 02/13/2017    Susan Hoffman 08/17/2020, 2:03 PM  [facility] [address] [place], [place], [place] Phone: [phone]   Fax:  [phone]  Name: Susan Hoffman MRN: [name] Date of Birth: 1990/12/12

## 2020-08-24 ENCOUNTER — Ambulatory Visit: Payer: 59

## 2020-08-24 ENCOUNTER — Other Ambulatory Visit: Payer: Self-pay

## 2020-08-24 DIAGNOSIS — M79661 Pain in right lower leg: Secondary | ICD-10-CM

## 2020-08-24 DIAGNOSIS — M6281 Muscle weakness (generalized): Secondary | ICD-10-CM | POA: Diagnosis not present

## 2020-08-24 NOTE — Therapy (Addendum)
[facility] [address] [place], [place], [address] Phone: [phone]   Fax:  [phone]  Physical Therapy Treatment  Patient Details  Name: Susan Hoffman MRN: [id number] Date of Birth: 1990/11/01 Referring Provider (PT): Lanetta InchPaul Malley   Encounter Date: 08/24/2020   PT End of Session - 08/24/20 0757    Visit Number 3    Number of Visits 17    Date for PT Re-Evaluation 09/06/20    Authorization Type eval: 07/12/20    PT Start Time 0800    PT Stop Time 0845    PT Time Calculation (min) 45 min    Activity Tolerance Patient tolerated treatment well    Behavior [facility] tasks assessed/performed           Past Medical History:  Diagnosis Date  . Abnormal cells of cervix 2016   Was told needs PAP every 6 months due to abnormal cells.   . Acquired deafness of right ear 2015  . ADHD     Past Surgical History:  Procedure Laterality Date  . nexplanon  2017  . TYMPANOPLASTY    . wisdom teeth removal      There were no vitals filed for this visit.   Subjective Assessment - 08/24/20 0757    Subjective Patient reports that she is doing well today.  Denies any resting calf pain.  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  She reports some mild muscle spasms in her calf after the last therapy session. No specific questions or concerns at this time.    Patient is accompained by: Family member   Husband   Pertinent History Susan Hoffman is an 30 y.o. female referred by orthopedics for partially torn medial gastroc. Pt was playing soccer on 07/10/20 when she she pushed off to start running and felt a pop in her calf. She could not put weight on her RLE and had to be assisted off the field. She went to walk-in orthopedics who diagnosed her with a partial gastroc tear, placed her in a walking boot with a heel lift, and advised her to be  WBAT utilizing crutches to walk as needed. She is currently out of work and reports that her employer told her she can return once she is able to ambulate without the crutches. She has continued to have difficulty with weightbearing since that time however is able to walk short distances without her crutches. She has also needed a double heel lift in her walking boot to help decrease pain in her calf. Her husband has provided extensive assistance around the house with IADLs and helps her in/out of the shower due to a small step to enter/exit. She has a prior history of R ankle fracture approximately 2 years ago which resulted in some persistent R foot numbness which continues. 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 She is currently tapering down off OCP in hopes of trying to get pregnant in the near future. Otherwise she denies any recent changes in health.    Limitations Walking;Standing;House hold activities    How long can you walk comfortably? <20'    Diagnostic tests None    Patient Stated Goals Decrease pain and be able to ambulate sufficiently to return to work    Currently in Pain? No/denies  TREATMENT   Manual Therapy  Moist heat pack to posterior R calf to improve tissue extensibility for stretching x 5 minutes during interim history and HEP review; Light IASTM to R calf starting distal and working proximal with pt in prone to stimulate blood flow, decrease pain, provide neural input, and improve tissue extensibility. Rulon Abide and sweeping strokes utilized. Utilized light effleurage to calf as well.  Patient is significant less tender today compared to previous session. Supine right ankle manual dorsiflexion stretch 30s hold x 3; Supine right ankle anterior to posterior talocrural mobilizations at available end range dorsiflexion, grade 2-3, 30 seconds/bout x 2 bouts to improve dorsiflexion range of motion;    Ther-ex  Supine  right ankle light manually resisted dorsiflexion, inversion, eversion, plantarflexion 2 x 10 each direction; Total Gym (TG) level 7 double leg squats 2x15; TG level 7 double leg heel raises with patient gradually progressing more weight onto right lower extremity 2x15; TG level 7 right single-leg squats x20; Right single-leg balance in parallel bars with light finger tapping as needed to stabilize 30 seconds x 2, pt not ready to progress to Airex pad at this time; Tandem balance in parallel bars with light finger tapping as needed to stabilize, right lower extremity in the rear x 60 seconds; 6 inch step right single-leg balance with controlled eccentric lower to perform left heel taps on floor, verbal instruction to limit internal rotation of right femur, BUE support, x 15;   Electrical Stimulation, unbilled  NMES with patient on Total Gym L22 using small muscle spasm setting on Empi Continuum unit at default setting (pulse rate: 35Hz , symmetrical waveform, 300 microsec pulse width, 2s ramp up/down, 10s on time/30s off time) at pt tolerable intensity of 9.0 with positive lead over medial proximal calf and negative lead on distal midline calf. Pt on Total Gym to block the ankle in neutral, pt encouraged to perform active calf contraction during "on" phase of NMES but not move through any range of motion (isometric contraction).    Pt educated throughout session about proper posture and technique with exercises. Improved exercise technique, movement at target joints, use of target muscles after min to mod verbal, visual, tactile cues.    Patient has made significant progress with respect to her right calf pain since the last therapy session.  She arrives out of her walking boot and was able to perform entire session ambulating without support.  Her active range of motion has significantly improved and her right ankle dorsiflexion and plantarflexion appear grossly symmetric to the left side.  Continued  with light stretching, STM, and strengthening to right ankle in all 4 directions. Progressed to closed chain strengthening on the Total Gym and L single leg/tandem stability in standing. Utilized NMES again for neuromuscular reeducation at the end of session (unbilled) with the hopes of improving recruitment of R calf muscle. Patient with significant improvement in tenderness to the medial head of the gastroc today. Pt encouraged to continue weaning out of the walking boot but to utilize as need to protect her calf in compromised situation such as while at work. Pt encouraged to continue HEP and follow-up as scheduled. She will benefit from PT services to address deficits in strength, mobility, and stability in order to return to full function at home, work, and with return to sport.                         PT Short Term Goals -  07/13/20 2213      PT SHORT TERM GOAL #1   Title Pt will be independent with HEP in order to decrease R calf pain and increase strength in order to improve pain-free function at home and work.    Time 4    Period Weeks    Status New    Target Date 08/09/20             PT Long Term Goals - 07/13/20 2213      PT LONG TERM GOAL #1   Title Pt will decrease worst R calf pain as reported on NPRS by at least 3 points in order to demonstrate clinically significant reduction in calf pain so she can return to work/full responsibilities at home with less pain    Baseline 07/12/20: worst: 9/10    Time 8    Status New    Target Date 09/06/20      PT LONG TERM GOAL #2   Title Pt will increase LEFS to >70/80 in order to demonstrate significant improvement in lower extremity function in order to return to work/full responsibilities at home as well as resume playing sooccer    Baseline 07/12/20: 14    Time 8    Period Weeks    Status New    Target Date 09/06/20      PT LONG TERM GOAL #3   Title Pt will improve R ankle dorsiflexion AROM in sitting to  within 5 degrees of LLE without pain in order to normalize running mechanics while playing soccer    Baseline 07/12/20: AROM dorsiflexion R/L: -17/20    Time 8    Period Weeks    Status New    Target Date 09/06/20      PT LONG TERM GOAL #4   Title Pt will improve FOTO to at least 70 in order to demonstrates significant improvement in function related to R calf so that she can return to work/full responsibilities at home as well as resume playing soccer    Baseline 07/12/20: 24    Time 8    Period Weeks    Status New    Target Date 09/06/20                 Plan - 08/24/20 0757    Clinical Impression Statement Patient has made significant progress with respect to her right calf pain since the last therapy session.  She arrives out of her walking boot and was able to perform entire session ambulating without support.  Her active range of motion has significantly improved and her right ankle dorsiflexion and plantarflexion appear grossly symmetric to the left side.  Continued with light stretching, STM, and strengthening to right ankle in all 4 directions. Progressed to closed chain strengthening on the Total Gym and L single leg/tandem stability in standing. Utilized NMES again for neuromuscular reeducation at the end of session (unbilled) with the hopes of improving recruitment of R calf muscle. Patient with significant improvement in tenderness to the medial head of the gastroc today. Pt encouraged to continue weaning out of the walking boot but to utilize as need to protect her calf in compromised situation such as while at work. Pt encouraged to continue HEP and follow-up as scheduled. She will benefit from PT services to address deficits in strength, mobility, and stability in order to return to full function at home, work, and with return to sport.    Personal Factors and Comorbidities Comorbidity 2;Past/Current Experience;Time since onset  of injury/illness/exacerbation    Comorbidities  Prior R ankle fracture, chronic R ankle instability    Examination-Activity Limitations Bathing;Caring for Others;Carry;Locomotion Level;Squat;Stairs;Stand;Transfers    Examination-Participation Restrictions Cleaning;Community Activity;Driving;Laundry;Meal Prep;Occupation;Yard Work    Conservation officer, historic buildings Evolving/Moderate complexity    Rehab Potential Excellent    PT Frequency 2x / week    PT Duration 8 weeks    PT Treatment/Interventions ADLs/Self Care Home Management;Aquatic Therapy;Biofeedback;Canalith Repostioning;Cryotherapy;Electrical Stimulation;Iontophoresis 4mg /ml Dexamethasone;Moist Heat;Traction;Ultrasound;DME Instruction;Gait training;Stair training;Functional mobility training;Therapeutic activities;Therapeutic exercise;Balance training;Neuromuscular re-education;Patient/family education;Manual techniques;Passive range of motion;Dry needling;Vestibular;Joint Manipulations    PT Next Visit Plan Continue with pain-free R ankle AROM and pain-free passive stretches of R ankle, concentric pain-free calf strengthening, progressive weightbearing, utilize soft tissue techniques/modalities as needed    PT Home Exercise Plan Medbridge Access Code:           Patient will benefit from skilled therapeutic intervention in order to improve the following deficits and impairments:  Pain,Decreased strength,Difficulty walking  Visit Diagnosis: Muscle weakness (generalized)  Pain in right lower leg     Problem List Patient Active Problem List   Diagnosis Date Noted  . Ankle pain, right 10/11/2019  . Abnormal Pap smear of cervix 02/13/2017  . Attention deficit hyperactivity disorder (ADHD), predominantly hyperactive type 02/13/2017  . 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 Deafness in right ear 02/13/2017   02/15/2017 Palmina Clodfelter PT, DPT, [facility]  Mychaela Lennartz 08/24/2020, 10:31 AM  [facility] [address] [place], [place],  [place] Phone: [phone]   Fax:  [phone]  Name: Monesha Monreal MRN: Susan Hoffman Date of Birth: Nov 16, 1990

## 2020-08-24 NOTE — Patient Instructions (Signed)
Access Code: [email or web address] URL: [email or web address] Date: 08/24/2020 Prepared by: Ria Comment  Exercises Seated Heel Raise - 2 x daily - 7 x weekly - 2 sets - 10 reps - 5s hold Standing Single Leg Stance with Counter Support - 2 x daily - 7 x weekly - 3 reps - 30s hold Standing Gastroc Stretch - 2 x daily - 7 x weekly - 3 reps - 30-45s hold Standing Soleus Stretch - 1 x daily - 7 x weekly - 3 reps - 30-45s hold  Patient Education Calf Strain

## 2020-08-30 NOTE — Patient Instructions (Incomplete)
TREATMENT   Manual Therapy Moist heat pack to posterior R calf to improve tissue extensibility for stretching x 5 minutes during interim history and HEP review; Light IASTM to R calf starting distal and working proximal with pt in prone to stimulate blood flow, decrease pain, provide neural input, and improve tissue extensibility. Rulon Abide and sweeping strokes utilized. Utilized light effleurage to calf as well.  Patient is significant less tender today compared to previous session. Supine right ankle manual dorsiflexion stretch 30s hold x 3; Supine right ankle anterior to posterior talocrural mobilizations at available end range dorsiflexion, grade 2-3, 30 seconds/bout x 2 bouts to improve dorsiflexion range of motion;    Ther-ex  Supine right ankle light manually resisted dorsiflexion, inversion, eversion, plantarflexion 2 x 10 each direction; Total Gym (TG) level 7 double leg squats 2x15; TG level 7 double leg heel raises with patient gradually progressing more weight onto right lower extremity 2x15; TG level 7 right single-leg squats x20; Right single-leg balance in parallel bars with light finger tapping as needed to stabilize 30 seconds x 2, pt not ready to progress to Airex pad at this time; Tandem balance in parallel bars with light finger tapping as needed to stabilize, right lower extremity in the rear x 60 seconds; 6 inch step right single-leg balance with controlled eccentric lower to perform left heel taps on floor, verbal instruction to limit internal rotation of right femur, BUE support, x 15;   Electrical Stimulation, unbilled  NMES with patient on Total Gym L22 using small muscle spasm setting on Empi Continuum unit at default setting (pulse rate: 35Hz , symmetrical waveform, 300 microsec pulse width, 2s ramp up/down, 10s on time/30s off time) at pt tolerable intensity of 9.0 with positive lead over medial proximal calf and negative lead on distal midline calf. Pt on Total  Gym to block the ankle in neutral, pt encouraged to perform active calf contraction during "on" phase of NMES but not move through any range of motion (isometric contraction).    Pt educated throughout session about proper posture and technique with exercises. Improved exercise technique, movement at target joints, use of target muscles after min to mod verbal, visual, tactile cues.    Patient has made significant progress with respect to her right calf pain since the last therapy session.  She arrives out of her walking boot and was able to perform entire session ambulating without support.  Her active range of motion has significantly improved and her right ankle dorsiflexion and plantarflexion appear grossly symmetric to the left side.  Continued with light stretching, STM, and strengthening to right ankle in all 4 directions. Progressed to closed chain strengthening on the Total Gym and L single leg/tandem stability in standing. Utilized NMES again for neuromuscular reeducation at the end of session (unbilled) with the hopes of improving recruitment of R calf muscle. Patient with significant improvement in tenderness to the medial head of the gastroc today. Pt encouraged to continue weaning out of the walking boot but to utilize as need to protect her calf in compromised situation such as while at work. Pt encouraged to continue HEP and follow-up as scheduled. She will benefit from PT services to address deficits in strength, mobility, and stability in order to return to full function at home, work, and with return to sport.

## 2020-08-31 ENCOUNTER — Ambulatory Visit: Payer: 59 | Attending: Physician Assistant

## 2020-08-31 ENCOUNTER — Other Ambulatory Visit: Payer: Self-pay

## 2020-08-31 DIAGNOSIS — M79661 Pain in right lower leg: Secondary | ICD-10-CM | POA: Insufficient documentation

## 2020-08-31 DIAGNOSIS — M6281 Muscle weakness (generalized): Secondary | ICD-10-CM | POA: Diagnosis not present

## 2020-08-31 NOTE — Therapy (Signed)
[facility] [address] [place], [place], [address] Phone: [phone]   Fax:  [phone]  Physical Therapy Treatment  Patient Details  Name: Susan Hoffman MRN: [id number] Date of Birth: June 02, 1990 Referring Provider (PT): Lanetta InchPaul Malley   Encounter Date: 08/31/2020   PT End of Session - 08/31/20 1514    Visit Number 4    Number of Visits 17    Date for PT Re-Evaluation 09/06/20    Authorization Type eval: 07/12/20    PT Start Time 1015    PT Stop Time 1100    PT Time Calculation (min) 45 min    Activity Tolerance Patient tolerated treatment well    Behavior [facility] Therapy [facility] for tasks assessed/performed           Past Medical History:  Diagnosis Date  . Abnormal cells of cervix 2016   Was told needs PAP every 6 months due to abnormal cells.   . Acquired deafness of right ear 2015  . ADHD     Past Surgical History:  Procedure Laterality Date  . nexplanon  2017  . TYMPANOPLASTY    . wisdom teeth removal      There were no vitals filed for this visit.   Subjective Assessment - 08/31/20 1150    Subjective Patient reports that she is doing well today.  Denies any resting calf pain.  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  She reports some mild muscle spasms in her calf intermittently. No specific questions or concerns at this time.    Patient is accompained by: Family member   Husband   Pertinent History Susan Hoffman is an 30 y.o. female referred by orthopedics for partially torn medial gastroc. Pt was playing soccer on 07/10/20 when she she pushed off to start running and felt a pop in her calf. She could not put weight on her RLE and had to be assisted off the field. She went to walk-in orthopedics who diagnosed her with a partial gastroc tear, placed her in a walking boot with a heel lift, and advised  her to be WBAT utilizing crutches to walk as needed. She is currently out of work and reports that her employer told her she can return once she is able to ambulate without the crutches. She has continued to have difficulty with weightbearing since that time however is able to walk short distances without her crutches. She has also needed a double heel lift in her walking boot to help decrease pain in her calf. Her husband has provided extensive assistance around the house with IADLs and helps her in/out of the shower due to a small step to enter/exit. She has a prior history of R ankle fracture approximately 2 years ago which resulted in some persistent R foot numbness which continues. 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 She is currently tapering down off OCP in hopes of trying to get pregnant in the near future. Otherwise she denies any recent changes in health.    Limitations Walking;Standing;House hold activities    How long can you walk comfortably? <20'    Diagnostic tests None    Patient Stated Goals Decrease pain and be able to ambulate sufficiently to return to work    Currently in Pain? No/denies  TREATMENT   Manual Therapy Moist heat pack to posterior R calf to improve tissue extensibility for stretching x 5 minutes during interim history and HEP review; Light IASTM to R calf starting distal and working proximal with pt in prone to stimulate blood flow, decrease pain, provide neural input, and improve tissue extensibility. Rulon Abide and sweeping strokes utilized. Utilized light effleurage to calf as well.  Patient continues to report less tenderness compared to previous sessions. Prone right ankle manual dorsiflexion stretch 30s hold x 2;    Ther-ex  Supine right ankle light manually resisted dorsiflexion, inversion, eversion, plantarflexion 2 x 10 each direction; TG L7 right single-leg squats x 15; TG L7 right single-leg heel  raises x 15; TG L15 right single leg heel raises 2 x 15; TG L22 single leg squats 2 x 10 BLE with therapist providing medially directed pull with green tband to encourage pt to engage hip lateral rotators/abductions and prevent valgus; R single leg balance with ball tosses with therapist, therapist tosses ball in different directions to challenge ankle stability 2 x 30s; Airex tandem balance (R foot in rear) with with ball tosses with therapist, therapist tosses ball in different directions to challenge ankle stability 2 x 30s; Airex single leg balance with clockwise/counterclockwise ball circles using contralateral LE x 20 CW/CCW on each leg; Discussed plan of care, return to sport, and HEP progression;   Pt educated throughout session about proper posture and technique with exercises. Improved exercise technique, movement at target joints, use of target muscles after min to mod verbal, visual, tactile cues.    Patient has made significant progress with respect to her right calf stability and strength since the initial evaluation. Significantly improved gait upon arrival today with much less guarding and less R knee hyperextension. Continued with light stretching, STM, and strengthening to right ankle in all 4 directions. Continued with strengthening on the Total Gym progressing to R single leg heel raises and also increasing the difficulty. Pt is still not ready for plymometric strengthening. Pt encouraged to continue weaning out of the walking boot at work as her dynamic stability improves in her RLE. Pt encouraged to continue HEP and follow-up as scheduled. She will benefit from PT services to address deficits in strength, mobility, and stability in order to return to full function at home, work, and with return to sport.                            PT Short Term Goals - 07/13/20 2213      PT SHORT TERM GOAL #1   Title Pt will be independent with HEP in order to  decrease R calf pain and increase strength in order to improve pain-free function at home and work.    Time 4    Period Weeks    Status New    Target Date 08/09/20             PT Long Term Goals - 07/13/20 2213      PT LONG TERM GOAL #1   Title Pt will decrease worst R calf pain as reported on NPRS by at least 3 points in order to demonstrate clinically significant reduction in calf pain so she can return to work/full responsibilities at home with less pain    Baseline 07/12/20: worst: 9/10    Time 8    Status New    Target Date 09/06/20      PT LONG TERM  GOAL #2   Title Pt will increase LEFS to >70/80 in order to demonstrate significant improvement in lower extremity function in order to return to work/full responsibilities at home as well as resume playing sooccer    Baseline 07/12/20: 14    Time 8    Period Weeks    Status New    Target Date 09/06/20      PT LONG TERM GOAL #3   Title Pt will improve R ankle dorsiflexion AROM in sitting to within 5 degrees of LLE without pain in order to normalize running mechanics while playing soccer    Baseline 07/12/20: AROM dorsiflexion R/L: -17/20    Time 8    Period Weeks    Status New    Target Date 09/06/20      PT LONG TERM GOAL #4   Title Pt will improve FOTO to at least 70 in order to demonstrates significant improvement in function related to R calf so that she can return to work/full responsibilities at home as well as resume playing soccer    Baseline 07/12/20: 24    Time 8    Period Weeks    Status New    Target Date 09/06/20                 Plan - 08/31/20 1514    Clinical Impression Statement Patient has made significant progress with respect to her right calf stability and strength since the initial evaluation. Significantly improved gait upon arrival today with much less guarding and less R knee hyperextension. Continued with light stretching, STM, and strengthening to right ankle in all 4 directions. Continued  with strengthening on the Total Gym progressing to R single leg heel raises and also increasing the difficulty. Pt is still not ready for plymometric strengthening. Pt encouraged to continue weaning out of the walking boot at work as her dynamic stability improves in her RLE. Pt encouraged to continue HEP and follow-up as scheduled. She will benefit from PT services to address deficits in strength, mobility, and stability in order to return to full function at home, work, and with return to sport.    Personal Factors and Comorbidities Comorbidity 2;Past/Current Experience;Time since onset of injury/illness/exacerbation    Comorbidities Prior R ankle fracture, chronic R ankle instability    Examination-Activity Limitations Bathing;Caring for Others;Carry;Locomotion Level;Squat;Stairs;Stand;Transfers    Examination-Participation Restrictions Cleaning;Community Activity;Driving;Laundry;Meal Prep;Occupation;Yard Work    Conservation officer, historic buildings Evolving/Moderate complexity    Rehab Potential Excellent    PT Frequency 2x / week    PT Duration 8 weeks    PT Treatment/Interventions ADLs/Self Care Home Management;Aquatic Therapy;Biofeedback;Canalith Repostioning;Cryotherapy;Electrical Stimulation;Iontophoresis 4mg /ml Dexamethasone;Moist Heat;Traction;Ultrasound;DME Instruction;Gait training;Stair training;Functional mobility training;Therapeutic activities;Therapeutic exercise;Balance training;Neuromuscular re-education;Patient/family education;Manual techniques;Passive range of motion;Dry needling;Vestibular;Joint Manipulations    PT Next Visit Plan Continue with pain-free R ankle AROM and pain-free passive stretches of R ankle, concentric pain-free calf strengthening, progressive weightbearing, utilize soft tissue techniques/modalities as needed    PT Home Exercise Plan Medbridge Access Code:           Patient will benefit from skilled therapeutic intervention in order to improve the  following deficits and impairments:  Pain,Decreased strength,Difficulty walking  Visit Diagnosis: Pain in right lower leg  Muscle weakness (generalized)     Problem List Patient Active Problem List   Diagnosis Date Noted  . Ankle pain, right 10/11/2019  . Abnormal Pap smear of cervix 02/13/2017  . Attention deficit hyperactivity disorder (ADHD), predominantly hyperactive type 02/13/2017  . Nummular  dermatitis 02/13/2017  . Deafness in right ear 02/13/2017   Sharalyn Ink Markeeta Scalf PT, DPT, GCS  Eri Mcevers 08/31/2020, 3:27 PM  [facility] [address] [place], [place], [address] Phone: [phone]   Fax:  [phone]  Name: Susan Hoffman MRN: [id number] Date of Birth: 09/29/1990

## 2020-09-14 ENCOUNTER — Other Ambulatory Visit: Payer: Self-pay

## 2020-09-14 ENCOUNTER — Ambulatory Visit: Payer: 59

## 2020-09-14 DIAGNOSIS — M6281 Muscle weakness (generalized): Secondary | ICD-10-CM | POA: Diagnosis not present

## 2020-09-14 DIAGNOSIS — M79661 Pain in right lower leg: Secondary | ICD-10-CM

## 2020-09-14 NOTE — Therapy (Signed)
[facility] [address] [place], [place], [address] Phone: [phone]   Fax:  [phone]  Physical Therapy Treatment  Patient Details  Name: Darice Vicario MRN: [id number] Date of Birth: 05-06-1990 Referring Provider (PT): Lanetta Inch   Encounter Date: 09/14/2020   PT End of Session - 09/14/20 1346    Visit Number 5    Number of Visits 17    Date for PT Re-Evaluation 09/06/20    Authorization Type eval: 07/12/20    PT Start Time 1145    PT Stop Time 1235    PT Time Calculation (min) 50 min    Activity Tolerance Patient tolerated treatment well    Behavior During Therapy [facility] for tasks assessed/performed           Past Medical History:  Diagnosis Date  . Abnormal cells of cervix 2016   Was told needs PAP every 6 months due to abnormal cells.   . Acquired deafness of right ear 2015  . ADHD     Past Surgical History:  Procedure Laterality Date  . nexplanon  2017  . TYMPANOPLASTY    . wisdom teeth removal      There were no vitals filed for this visit.   Subjective Assessment - 09/14/20 1155    Subjective Patient reports that she is doing well today.  Denies any resting calf pain.  She is out of her walking boot at home, work, and with leisure activities. She has been able to return to the gym 5d/wk with restriction. She has still been unable to run or jog. 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 She reports some calf pain and swelling aftewards. No specific questions currently.    Patient is accompained by: Family member   Husband   Pertinent History Bethani Brugger is an 30 y.o. female referred by orthopedics for partially torn medial gastroc. Pt was playing soccer on 07/10/20 when she she pushed off to start running and felt a pop in her calf. She could not put weight on her RLE and had to be assisted off the field. She went to walk-in  orthopedics who diagnosed her with a partial gastroc tear, placed her in a walking boot with a heel lift, and advised her to be WBAT utilizing crutches to walk as needed. She is currently out of work and reports that her employer told her she can return once she is able to ambulate without the crutches. She has continued to have difficulty with weightbearing since that time however is able to walk short distances without her crutches. She has also needed a double heel lift in her walking boot to help decrease pain in her calf. Her husband has provided extensive assistance around the house with IADLs and helps her in/out of the shower due to a small step to enter/exit. She has a prior history of R ankle fracture approximately 2 years ago which resulted in some persistent R foot numbness which continues. 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 She is currently tapering down off OCP in hopes of trying to get pregnant in the near future. Otherwise she denies any recent changes in health.    Limitations Walking;Standing;House hold activities    How long can you walk comfortably? <20'    Diagnostic tests None    Patient Stated Goals  Decrease pain and be able to ambulate sufficiently to return to work    Currently in Pain? No/denies               TREATMENT   Manual Therapy Light IASTM to R calf starting distal and working proximal with pt in prone to stimulate blood flow, decrease pain, provide neural input, and improve tissue extensibility. Rulon Abide and sweeping strokes utilized. Utilized light effleurage to calfas well.Patient denies any pain today but reports a mild burning sensation; Supine right ankle anterior to posterior talocrural mobilizations at available end range dorsiflexion, grade 3, 30 seconds/bout x 3 bouts to improve dorsiflexion range of motion; Prone right ankle posterior to anterior talocrural mobilizations, grade 3, 30 seconds/bout x3 bouts  to improve plantarflexion range of motion; Standing mobilization with movement anterior to posterior R ankle talocrural mobilizations with belt and pt performing forward lunge, grade 3-4, x 10 reps to improve dorsiflexion range of motion;   Ther-ex SciFit L4 x 10 minutes for warm-up, unbilled, pt denies pain; Supine right ankle light manually resisted dorsiflexion, inversion, eversion, plantarflexion2x 10 each direction; TG L22 double leg heel rasies x 20; TG L22 single leg squats 2 x 10 BLE with dyandisc under R foot and shoe removed, during second set therapist provided medially directed pull with gray tband to encourage pt to engage hip lateral rotators/abductions and prevent valgus; TG L9, 13, 16 plyometric jumping x 10 at each level, pt denies pain; Airex R single leg RDL cone taps in multiple directions to challenge dynamic RLE stability; Attempted jogging on treadmill progressing from walk at 3.0 to jog at 5.0 however discontinued within the first 10s due to increase in R medial calf pain with pulling sensation; Soccer ball taps alternating, modified due to pulling in R calf x multiple bouts on each side; Discussed plan of care and HEP progression;   Pt educated throughout session about proper posture and technique with exercises. Improved exercise technique, movement at target joints, use of target muscles after min to mod verbal, visual, tactile cues.    Patient has made significant progress with respect to her right calf stability and strength since starting therapy. She is also demonstrating significant improvement in her R knee control. Initiated decreased WB plyometrics on the Total Gym without an increase in pain. She continues to demonstrate decreased R single leg stability with R single leg russian deadlift reaches. Also tried jogging on the treadmill today however quickly discontinued due to increase in R calf pain. Pt encouraged to continue HEP and follow-up as  scheduled.She is safe to progress at the gym within pain tolerance but currently not ready to begin running, jogging, or full weightbearing plyometrics. Shewill benefit from PT services to address deficits in strength,mobility, and stabilityin order to return to full function at home, work, and with return to sport.                          PT Short Term Goals - 07/13/20 2213      PT SHORT TERM GOAL #1   Title Pt will be independent with HEP in order to decrease R calf pain and increase strength in order to improve pain-free function at home and work.    Time 4    Period Weeks    Status New    Target Date 08/09/20             PT Long Term Goals - 07/13/20 2213  PT LONG TERM GOAL #1   Title Pt will decrease worst R calf pain as reported on NPRS by at least 3 points in order to demonstrate clinically significant reduction in calf pain so she can return to work/full responsibilities at home with less pain    Baseline 07/12/20: worst: 9/10    Time 8    Status New    Target Date 09/06/20      PT LONG TERM GOAL #2   Title Pt will increase LEFS to >70/80 in order to demonstrate significant improvement in lower extremity function in order to return to work/full responsibilities at home as well as resume playing sooccer    Baseline 07/12/20: 14    Time 8    Period Weeks    Status New    Target Date 09/06/20      PT LONG TERM GOAL #3   Title Pt will improve R ankle dorsiflexion AROM in sitting to within 5 degrees of LLE without pain in order to normalize running mechanics while playing soccer    Baseline 07/12/20: AROM dorsiflexion R/L: -17/20    Time 8    Period Weeks    Status New    Target Date 09/06/20      PT LONG TERM GOAL #4   Title Pt will improve FOTO to at least 70 in order to demonstrates significant improvement in function related to R calf so that she can return to work/full responsibilities at home as well as resume playing soccer     Baseline 07/12/20: 24    Time 8    Period Weeks    Status New    Target Date 09/06/20                 Plan - 09/14/20 1347    Clinical Impression Statement Patient has made significant progress with respect to her right calf stability and strength since starting therapy. She is also demonstrating significant improvement in her R knee control. Initiated decreased WB plyometrics on the Total Gym without an increase in pain. She continues to demonstrate decreased R single leg stability with R single leg russian deadlift reaches. Also tried jogging on the treadmill today however quickly discontinued due to increase in R calf pain. Pt encouraged to continue HEP and follow-up as scheduled. She is safe to progress at the gym within pain tolerance but currently not ready to begin running, jogging, or full weightbearing plyometrics. She will benefit from PT services to address deficits in strength, mobility, and stability in order to return to full function at home, work, and with return to sport.Patient has made significant progress with respect to her right calf stability and strength since starting therapy. She is also demonstrating significant improvement in her R knee control. Initiated decreased WB plyometrics on the Total Gym without an increase in pain. She continues to demonstrate decreased R single leg stability with R single leg russian deadlift reaches. Also tried jogging on the treadmill today however quickly discontinued due to increase in R calf pain. Pt encouraged to continue HEP and follow-up as scheduled. She is safe to progress at the gym within pain tolerance but currently not ready to begin running, jogging, or full weightbearing plyometrics. She will benefit from PT services to address deficits in strength, mobility, and stability in order to return to full function at home, work, and with return to sport.    Personal Factors and Comorbidities Comorbidity 2;Past/Current Experience;Time  since onset of injury/illness/exacerbation    Comorbidities  Prior R ankle fracture, chronic R ankle instability    Examination-Activity Limitations Bathing;Caring for Others;Carry;Locomotion Level;Squat;Stairs;Stand;Transfers    Examination-Participation Restrictions Cleaning;Community Activity;Driving;Laundry;Meal Prep;Occupation;Yard Work    Conservation officer, historic buildingstability/Clinical Decision Making Evolving/Moderate complexity    Rehab Potential Excellent    PT Frequency 2x / week    PT Duration 8 weeks    PT Treatment/Interventions ADLs/Self Care Home Management;Aquatic Therapy;Biofeedback;Canalith Repostioning;Cryotherapy;Electrical Stimulation;Iontophoresis 4mg /ml Dexamethasone;Moist Heat;Traction;Ultrasound;DME Instruction;Gait training;Stair training;Functional mobility training;Therapeutic activities;Therapeutic exercise;Balance training;Neuromuscular re-education;Patient/family education;Manual techniques;Passive range of motion;Dry needling;Vestibular;Joint Manipulations    PT Next Visit Plan Continue with pain-free R ankle AROM and pain-free passive stretches of R ankle, concentric pain-free calf strengthening, progressive weightbearing, plyometrics, utilize soft tissue techniques/modalities as needed    PT Home Exercise Plan Medbridge Access Code: [email or web address]           Patient will benefit from skilled therapeutic intervention in order to improve the following deficits and impairments:  Pain,Decreased strength,Difficulty walking  Visit Diagnosis: Pain in right lower leg  Muscle weakness (generalized)     Problem List Patient Active Problem List   Diagnosis Date Noted  . Ankle pain, right 10/11/2019  . Abnormal Pap smear of cervix 02/13/2017  . Attention deficit hyperactivity disorder (ADHD), predominantly hyperactive type 02/13/2017  . 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 Deafness in right ear 02/13/2017    Sharalyn InkJason D Alleya Demeter PT, DPT, GCS  Atziry Baranski 09/14/2020, 2:01 PM  [facility] [address] [place], [place], [address] Phone: [phone]   Fax:  [phone]  Name: Theophilus KindsMarina Natasha Sheard MRN: [id number] Date of Birth: 1990/05/18

## 2020-09-19 NOTE — Patient Instructions (Addendum)
Access Code: [email or web address] URL: [email or web address] Date: 09/21/2020 Prepared by: Ria Comment  Exercises Standing Gastroc Stretch (Mirrored) - 2 x daily - 7 x weekly - 3 reps - 30-45s hold Standing Soleus Stretch (Mirrored) - 1 x daily - 7 x weekly - 3 reps - 30-45s hold Single Leg Heel Raise with Counter Support - 1 x daily - 7 x weekly - 2 sets - 10 reps - 5s hold Lateral Single Leg Lunge Jumps - 1 x daily - 7 x weekly - 2 sets - 10 reps Single Leg Deadlift with Kettlebell - 1 x daily - 7 x weekly - 2 sets - 10 reps Single Leg Running Balance (Mirrored) - 1 x daily - 7 x weekly - 2 sets - 10 reps  Patient Education Calf Strain

## 2020-09-21 ENCOUNTER — Other Ambulatory Visit: Payer: Self-pay

## 2020-09-21 ENCOUNTER — Ambulatory Visit: Payer: 59

## 2020-09-21 DIAGNOSIS — M6281 Muscle weakness (generalized): Secondary | ICD-10-CM

## 2020-09-21 DIAGNOSIS — M79661 Pain in right lower leg: Secondary | ICD-10-CM | POA: Diagnosis not present

## 2020-09-21 NOTE — Therapy (Addendum)
[facility] [address] [place], [place], [address] Phone: [phone]   Fax:  [phone]  Physical Therapy Treatment  Patient Details  Name: Susan Hoffman MRN: [id number] Date of Birth: [birth year] Referring Provider (PT): Mertie Moores   Encounter Date: 09/21/2020   PT End of Session - 09/21/20 0939    Visit Number 6    Number of Visits 33    Date for PT Re-Evaluation 11/09/20    Authorization Type eval: 07/12/20    PT Start Time 0935    PT Stop Time 1020    PT Time Calculation (min) 45 min    Activity Tolerance Patient tolerated treatment well    Behavior During Therapy [facility] for tasks assessed/performed           Past Medical History:  Diagnosis Date  . Abnormal cells of cervix 2016   Was told needs PAP every 6 months due to abnormal cells.   . Acquired deafness of right ear 2015  . ADHD     Past Surgical History:  Procedure Laterality Date  . nexplanon  2017  . TYMPANOPLASTY    . wisdom teeth removal      There were no vitals filed for this visit.   Subjective Assessment - 09/21/20 0938    Subjective Patient reports that she is doing well today.  Denies any resting calf pain. She is still unable to run or jog. Has continued making progress in the gym with weight training. No specific questions currently.    Patient is accompained by: Family member   Husband   Pertinent History Susan Hoffman is an 30 y.o. female referred by orthopedics for partially torn medial gastroc. Pt was playing soccer on 07/10/20 when she she pushed off to start running and felt a pop in her calf. She could not put weight on her RLE and had to be assisted off the field. She went to walk-in orthopedics who diagnosed her with a partial gastroc tear, placed her in a walking boot with a heel lift, and advised her to be WBAT utilizing crutches to walk as needed. She is currently out of work and reports that her employer told her she can return  once she is able to ambulate without the crutches. She has continued to have difficulty with weightbearing since that time however is able to walk short distances without her crutches. She has also needed a double heel lift in her walking boot to help decrease pain in her calf. Her husband has provided extensive assistance around the house with IADLs and helps her in/out of the shower due to a small step to enter/exit. She has a prior history of R ankle fracture approximately 2 years ago which resulted in some persistent R foot numbness which continues. 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 She is currently tapering down off OCP in hopes of trying to get pregnant in the near future. Otherwise she denies any recent changes in health.    Limitations Walking;Standing;House hold activities    How long can you walk comfortably? <20'    Diagnostic tests None    Patient Stated Goals Decrease pain and be able to ambulate sufficiently to return to work    Currently in Pain? No/denies             TREATMENT   Manual Therapy Light IASTM and effleurage to R calf starting distal and working proximal with pt in prone  to stimulate blood flow, decrease pain, provide neural input, and improve tissue extensibility. Marc Morgans and sweeping strokes utilized. Pt continues with a significant tender point about 1 inch in length in the proximal medial calf just below the popliteal fossa; Supine right ankle anterior to posterior talocrural mobilizations at available end range dorsiflexion, grade 3, 30 seconds/bout x 2 bouts to improve dorsiflexion range of motion; Prone right ankle posterior to anterior talocrural mobilizations, grade 3, 30 seconds/bout x 2 bouts to improve plantarflexion range of motion; Grade V, talocrural distraction manipulation with loud cavitation and immediate improvement in ankle DF range of motion;   Ther-ex SciFit L4 x 5 minutes for warm-up, unbilled,  pt denies pain; Updated outcome measures and goals with patient: FOTO: 67 LEFS: 55/80 Worst calf pain: 4/10 Ankle ROM: R/L: 8/21 (seated bent knee), 31/44 (standing bent knee)  Total Gym L22 single leg squats 2 x 10 BLE with dyandisc under R foot and shoe removed, during second set therapist provided medially directed pull with green tband to encourage pt to engage hip lateral rotators/abductions and prevent valgus; Total Gym L22 plyometric double leg jumping x 10, pt denies pain; Total Gym L22 plyometric R single leg jumping 2 x 15, pt denies pain, initially struggles with motor control and explosive plantarflexion but improves with repetition; Jogging on treadmill progressing from walk at 3.0 to jog at 5.0 and then 6.0 for a total of 4:13, terminated due to some pulling in medial R calf as well as fatigue in the muscle; Med ball "touches" alternating, modified due to pulling in R calf initially x multiple bouts on each side; Skaters for lateral bounding x 10, pt denies any increase in pain but does report mild pulling in calf; Discussed plan of care and HEP progression;   Pt educated throughout session about proper posture and technique with exercises. Improved exercise technique, movement at target joints, use of target muscles after min to mod verbal, visual, tactile cues.    Patient has continued to make significant progress with respect to her right calf stability and strength since starting therapy. Updated goals with patient today. Her FOTO has improved from 24 at the initial evaluation to 53 today. Her LEFS has improved from 14/80 initially to 55/80 today. Her R ankle dorsiflexion has improved considerably and is 31 degrees in standing today and 8 degrees in seated (knee bent for both positions). Her worst calf pain over the last week has been 4/10. Progressed limited weightbearing plyometrics on the Total Gym without an increase in pain today. Attempted jogging again today and pt is  able to make it 4:13 minutes with a slight increase in pain as well as fatigue toward the end of her time. Pt encouraged to continue HEP and follow-up as scheduled.She is safe to progress at the gym within pain tolerance and is also to initiate some light jogging within pain-free tolerance. Shewill benefit from PT services to address deficits in strength,mobility, and stabilityin order to return to full function at home, work, and with return to sport.                             PT Short Term Goals - 09/21/20 1417      PT SHORT TERM GOAL #1   Title Pt will be independent with HEP in order to decrease R calf pain and increase strength in order to improve pain-free function at home and work.    Time 4  Period Weeks    Status Achieved    Target Date 08/09/20             PT Long Term Goals - 09/21/20 0941      PT LONG TERM GOAL #1   Title Pt will decrease worst R calf pain as reported on NPRS by at least 3 points in order to demonstrate clinically significant reduction in calf pain so she can return to work/full responsibilities at home with less pain    Baseline 07/12/20: worst: 9/10; 09/21/20: 4/10 (calf);    Time 8    Period Weeks    Status Achieved      PT LONG TERM GOAL #2   Title Pt will increase LEFS to >70/80 in order to demonstrate significant improvement in lower extremity function in order to return to work/full responsibilities at home as well as resume playing sooccer    Baseline 07/12/20: 14/80; 09/21/20: 55/80    Time 8    Period Weeks    Status Partially Met    Target Date 11/09/20      PT LONG TERM GOAL #3   Title Pt will improve R ankle dorsiflexion AROM in sitting to within 5 degrees of LLE without pain in order to normalize running mechanics while playing soccer    Baseline 07/12/20: AROM dorsiflexion R/L: -17/20; 09/21/20: R/L: 8/21 (sitting knee bent)  31/44 (in WB)    Time 8    Period Weeks    Status Partially Met    Target Date  11/09/20      PT LONG TERM GOAL #4   Title Pt will improve FOTO to at least 70 in order to demonstrates significant improvement in function related to R calf so that she can return to work/full responsibilities at home as well as resume playing soccer    Baseline 07/12/20: 24; 09/21/20: 67    Time 8    Period Weeks    Status Partially Met    Target Date 11/09/20                 Plan - 09/21/20 0940    Clinical Impression Statement Patient has continued to make significant progress with respect to her right calf stability and strength since starting therapy. Updated goals with patient today. Her FOTO has improved from 24 at the initial evaluation to 47 today. Her LEFS has improved from 14/80 initially to 55/80 today. Her R ankle dorsiflexion has improved considerably and is 31 degrees in standing today and 8 degrees in seated (knee bent for both positions). Her worst calf pain over the last week has been 4/10. Progressed limited weightbearing plyometrics on the Total Gym without an increase in pain today. Attempted jogging again today and pt is able to make it 4:13 minutes with a slight increase in pain as well as fatigue toward the end of her time. Pt encouraged to continue HEP and follow-up as scheduled. She is safe to progress at the gym within pain tolerance and is also to initiate some light jogging within pain-free tolerance. She will benefit from PT services to address deficits in strength, mobility, and stability in order to return to full function at home, work, and with return to sport.    Personal Factors and Comorbidities Comorbidity 2;Past/Current Experience;Time since onset of injury/illness/exacerbation    Comorbidities Prior R ankle fracture, chronic R ankle instability    Examination-Activity Limitations Bathing;Caring for Others;Carry;Locomotion Level;Squat;Stairs;Stand;Transfers    Examination-Participation Restrictions Cleaning;Community Activity;Driving;Laundry;Meal  Prep;Occupation;Susan Hoffman Work  Stability/Clinical Decision Making Evolving/Moderate complexity    Rehab Potential Excellent    PT Frequency 2x / week    PT Duration 8 weeks    PT Treatment/Interventions ADLs/Self Care Home Management;Aquatic Therapy;Biofeedback;Canalith Repostioning;Cryotherapy;Electrical Stimulation;Iontophoresis 9m/ml Dexamethasone;Moist Heat;Traction;Ultrasound;DME Instruction;Gait training;Stair training;Functional mobility training;Therapeutic activities;Therapeutic exercise;Balance training;Neuromuscular re-education;Patient/family education;Manual techniques;Passive range of motion;Dry needling;Vestibular;Joint Manipulations    PT Next Visit Plan Continue with pain-free R ankle AROM and pain-free passive stretches of R ankle, concentric pain-free calf strengthening, progressive weightbearing, plyometrics, utilize soft tissue techniques/modalities as needed    PT Home Exercise Plan Medbridge Access Code: [email or web address]          Patient will benefit from skilled therapeutic intervention in order to improve the following deficits and impairments:  Pain,Decreased strength,Difficulty walking  Visit Diagnosis: Pain in right lower leg  Muscle weakness (generalized)     Problem List Patient Active Problem List   Diagnosis Date Noted  . Ankle pain, right 10/11/2019  . Abnormal Pap smear of cervix 02/13/2017  . Attention deficit hyperactivity disorder (ADHD), predominantly hyperactive type 02/13/2017  . 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 Deafness in right ear 02/13/2017   JLyndel SafeHuprich PT, DPT, GCS  Damoney Julia 09/21/2020, 2:27 PM  [place] [facility] [place] [address]Phone: [phone]  Fax:  [phone] Name: Susan WhittleseyMRN: [id number] of Birth: 1Jul 27, 1992

## 2020-09-21 NOTE — Addendum Note (Signed)
Addended by: Ria Comment D on: 09/21/2020 02:21 PM   Modules accepted: Orders

## 2020-09-28 ENCOUNTER — Other Ambulatory Visit: Payer: Self-pay

## 2020-09-28 ENCOUNTER — Ambulatory Visit: Payer: 59 | Attending: Physician Assistant

## 2020-09-28 DIAGNOSIS — M6281 Muscle weakness (generalized): Secondary | ICD-10-CM | POA: Insufficient documentation

## 2020-09-28 DIAGNOSIS — M79661 Pain in right lower leg: Secondary | ICD-10-CM | POA: Insufficient documentation

## 2020-09-28 NOTE — Therapy (Signed)
[facility] [address] [place], [place], [address] Phone: [phone]   Fax:  [phone]  Physical Therapy Treatment  Patient Details  Name: Susan Hoffman MRN: [id number] Date of Birth: October 17, 1990 Referring Provider (PT): Mertie Moores   Encounter Date: 09/28/2020   PT End of Session - 09/28/20 0801    Visit Number 7    Number of Visits 33    Date for PT Re-Evaluation 11/09/20    Authorization Type eval: 07/12/20    PT Start Time 0759    PT Stop Time 0845    PT Time Calculation (min) 46 min    Activity Tolerance Patient tolerated treatment well    Behavior During Therapy [facility] for tasks assessed/performed           Past Medical History:  Diagnosis Date  . Abnormal cells of cervix 2016   Was told needs PAP every 6 months due to abnormal cells.   . Acquired deafness of right ear 2015  . ADHD     Past Surgical History:  Procedure Laterality Date  . nexplanon  2017  . TYMPANOPLASTY    . wisdom teeth removal      There were no vitals filed for this visit.   Subjective Assessment - 09/28/20 0759    Subjective Patient reports that she is doing well today.  Denies any resting calf pain. Still has a spot on her medial calf about the size of a nickel that is painful occasionally. States that she can jog short distances outside but the treadmill is more painful. She has continued making progress in the gym with weight training. No specific questions currently.    Patient is accompained by: Family member   Husband   Pertinent History Susan Hoffman is an 30 y.o. female referred by orthopedics for partially torn medial gastroc. Pt was playing soccer on 07/10/20 when she she pushed off to start running and felt a pop in her calf. She could not put weight on her RLE and had to be assisted off the field. She went to walk-in orthopedics who diagnosed her with a partial gastroc tear, placed her in a walking boot with a heel lift, and  advised her to be WBAT utilizing crutches to walk as needed. She is currently out of work and reports that her employer told her she can return once she is able to ambulate without the crutches. She has continued to have difficulty with weightbearing since that time however is able to walk short distances without her crutches. She has also needed a double heel lift in her walking boot to help decrease pain in her calf. Her husband has provided extensive assistance around the house with IADLs and helps her in/out of the shower due to a small step to enter/exit. She has a prior history of R ankle fracture approximately 2 years ago which resulted in some persistent R foot numbness which continues. 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 She is currently tapering down off OCP in hopes of trying to get pregnant in the near future. Otherwise she denies any recent changes in health.    Limitations Walking;Standing;House hold activities    How long can you walk comfortably? <20'    Diagnostic tests None    Patient Stated Goals Decrease pain and be able to ambulate sufficiently to return to work    Currently in Pain? No/denies  TREATMENT   Manual Therapy Light STM with effleurage to R calf starting distal and working proximal with pt in prone to stimulate blood flow, decrease pain, provide neural input, and improve tissue extensibility. Pt continues with a significant tender point and palpable "divot" in calf about 1 inch in length in the proximal medial calf just below the popliteal fossa; Supine right ankle anterior to posterior talocrural mobilizations at available end range dorsiflexion, grade 3, 30 seconds/bout x 2 bouts to improve dorsiflexion range of motion; Grade V, talocrural distraction manipulation with cavitation;   Ther-ex Treadmill 2.5 mph x 5 minutes for warm-up during history, pt denies pain; Total Gym L22 single leg squats x 15  RLE; Total Gym L22 plyometric double leg jumping x 15, pt denies pain; Total Gym L22 plyometric R single leg jumping 2 x 15, pt denies pain, improved compared to previous session; "Y" balance reaches in R SLS with LLE moving to 12, 4 and 8 o'clock positions x 10; Airex "Y" balance reaches in R SLS with LLE moving to 12, 4 and 8 o'clock positions x 10; Skaters for lateral bounding x 15, pt denies any increase in pain but does report mild pulling in calf; Skaters for lateral bonding lading with RLE on BOSU and exploding off x 15; Diona Foley tosses with therapist in multiple directions with pt in R SLS on foam wedge, wedge modified to place ankle in inversion, eversion, plantarflexion, and dorsiflexion, performed x 30s in each position; Diona Foley tosses with therapist R SLS on 1/2 bolster x 30s; TRX R single leg squats 2 x 10; Prostretch calf stretch 3 x 30s, spaced intermittently throughout exercises;   Pt educated throughout session about proper posture and technique with exercises. Improved exercise technique, movement at target joints, use of target muscles after min to mod verbal, visual, tactile cues.    Patient has continued to make significant progress with respect to her right calfstability and strength since starting therapy. Repeated jumping on the Total Gym with patient and she is able to perform today with fatigue but no real notable pain. She continues to lack some explosive power in her R calf for plantarflexion during jumps as well as with lateral skaters. R single leg stability is progressing and challenged today with dynamic single leg activities on both firm and uneven surfaces. Pt encouraged to continue to progress at home and next appointment was scheduled for 2 weeks from today. Unsure whether pt will need this appointment so encouraged pt to call and cancel if she is making significant progress at home.She is safe to progress at the gym within pain tolerance and is also to continue  some light jogging within pain-free tolerance. Pt encouraged to progress to pain-free running once she is able to jog without pain. Shewill benefit from PT services to address deficits in strength,mobility, and stabilityin order to return to full function at home, work, and with return to sport.                             PT Short Term Goals - 09/21/20 1417      PT SHORT TERM GOAL #1   Title Pt will be independent with HEP in order to decrease R calf pain and increase strength in order to improve pain-free function at home and work.    Time 4    Period Weeks    Status Achieved    Target Date 08/09/20  PT Long Term Goals - 09/21/20 0941      PT LONG TERM GOAL #1   Title Pt will decrease worst R calf pain as reported on NPRS by at least 3 points in order to demonstrate clinically significant reduction in calf pain so she can return to work/full responsibilities at home with less pain    Baseline 07/12/20: worst: 9/10; 09/21/20: 4/10 (calf);    Time 8    Period Weeks    Status Achieved      PT LONG TERM GOAL #2   Title Pt will increase LEFS to >70/80 in order to demonstrate significant improvement in lower extremity function in order to return to work/full responsibilities at home as well as resume playing sooccer    Baseline 07/12/20: 14/80; 09/21/20: 55/80    Time 8    Period Weeks    Status Partially Met    Target Date 11/09/20      PT LONG TERM GOAL #3   Title Pt will improve R ankle dorsiflexion AROM in sitting to within 5 degrees of LLE without pain in order to normalize running mechanics while playing soccer    Baseline 07/12/20: AROM dorsiflexion R/L: -17/20; 09/21/20: R/L: 8/21 (sitting knee bent)  31/44 (in WB)    Time 8    Period Weeks    Status Partially Met    Target Date 11/09/20      PT LONG TERM GOAL #4   Title Pt will improve FOTO to at least 70 in order to demonstrates significant improvement in function related to R  calf so that she can return to work/full responsibilities at home as well as resume playing soccer    Baseline 07/12/20: 24; 09/21/20: 67    Time 8    Period Weeks    Status Partially Met    Target Date 11/09/20                 Plan - 09/28/20 0801    Clinical Impression Statement Patient has continued to make significant progress with respect to her right calf stability and strength since starting therapy. Repeated jumping on the Total Gym with patient and she is able to perform today with fatigue but no real notable pain. She continues to lack some explosive power in her R calf for plantarflexion during jumps as well as with lateral skaters. R single leg stability is progressing and challenged today with dynamic single leg activities on both firm and uneven surfaces. Pt encouraged to continue to progress at home and next appointment was scheduled for 2 weeks from today. Unsure whether pt will need this appointment so encouraged pt to call and cancel if she is making significant progress at home. She is safe to progress at the gym within pain tolerance and is also to continue some light jogging within pain-free tolerance. Pt encouraged to progress to pain-free running once she is able to jog without pain. She will benefit from PT services to address deficits in strength, mobility, and stability in order to return to full function at home, work, and with return to sport.    Personal Factors and Comorbidities Comorbidity 2;Past/Current Experience;Time since onset of injury/illness/exacerbation    Comorbidities Prior R ankle fracture, chronic R ankle instability    Examination-Activity Limitations Bathing;Caring for Others;Carry;Locomotion Level;Squat;Stairs;Stand;Transfers    Examination-Participation Restrictions Cleaning;Community Activity;Driving;Laundry;Meal Prep;Occupation;[employer] Work    Stability/Clinical Decision Making Evolving/Moderate complexity    Rehab Potential Excellent    PT  Frequency 2x / week  PT Duration 8 weeks    PT Treatment/Interventions ADLs/Self Care Home Management;Aquatic Therapy;Biofeedback;Canalith Repostioning;Cryotherapy;Electrical Stimulation;Iontophoresis 36m/ml Dexamethasone;Moist Heat;Traction;Ultrasound;DME Instruction;Gait training;Stair training;Functional mobility training;Therapeutic activities;Therapeutic exercise;Balance training;Neuromuscular re-education;Patient/family education;Manual techniques;Passive range of motion;Dry needling;Vestibular;Joint Manipulations    PT Next Visit Plan Continue with pain-free R ankle AROM and pain-free passive stretches of R ankle, concentric pain-free calf strengthening, progressive weightbearing, plyometrics, utilize soft tissue techniques/modalities as needed    PT Home Exercise Plan Medbridge Access Code: [email or web address]          Patient will benefit from skilled therapeutic intervention in order to improve the following deficits and impairments:  Pain,Decreased strength,Difficulty walking  Visit Diagnosis: Pain in right lower leg  Muscle weakness (generalized)     Problem List Patient Active Problem List   Diagnosis Date Noted  . Ankle pain, right 10/11/2019  . Abnormal Pap smear of cervix 02/13/2017  . Attention deficit hyperactivity disorder (ADHD), predominantly hyperactive type 02/13/2017  . 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 Deafness in right ear 02/13/2017   JLyndel SafeHuprich PT, DPT, GCS  Haillee Johann 09/28/2020, 10:37 AM  [place] [facility] [place] [address]Phone: [phone]  Fax:  [phone] Name: MKrystol RoccoMRN: [id number] of Birth: 107/30/1992

## 2020-10-07 ENCOUNTER — Telehealth: Payer: 59 | Admitting: Nurse Practitioner

## 2020-10-07 ENCOUNTER — Encounter: Payer: Self-pay | Admitting: Internal Medicine

## 2020-10-07 DIAGNOSIS — J329 Chronic sinusitis, unspecified: Secondary | ICD-10-CM

## 2020-10-07 MED ORDER — DOXYCYCLINE HYCLATE 100 MG PO TABS: 100.0000 mg | ORAL_TABLET | Freq: Two times a day (BID) | ORAL | 0 refills | Status: AC

## 2020-10-07 MED ORDER — FLUTICASONE PROPIONATE 50 MCG/ACT NA SUSP
2.0000 | Freq: Every day | NASAL | 0 refills | Status: DC
Start: 2020-10-07 — End: 2021-03-17

## 2020-10-07 NOTE — Progress Notes (Signed)
We are sorry that you are not feeling well.  Here is how we plan to help!  Based on what you have shared with me it looks like you have sinusitis.  Sinusitis is inflammation and infection in the sinus cavities of the head.  Based on your presentation I believe you most likely have Acute Bacterial Sinusitis.  This is an infection caused by bacteria and is treated with antibiotics. I have prescribed Doxycycline 100mg  by mouth twice a day for 10 days. I am also prescribing Flonase nasal spray, use 2 sprays in each nostril daily until symptoms improve. You may use an oral decongestant such as Mucinex D or if you have glaucoma or high blood pressure use plain Mucinex. Saline nasal spray help and can safely be used as often as needed for congestion.  If you develop worsening sinus pain, fever or notice severe headache and vision changes, or if symptoms are not better after completion of antibiotic, please schedule an appointment with a health care provider.    Sinus infections are not as easily transmitted as other respiratory infection, however we still recommend that you avoid close contact with loved ones, especially the very young and elderly.  Remember to wash your hands thoroughly throughout the day as this is the number one way to prevent the spread of infection!  Home Care: Only take medications as instructed by your medical team. Complete the entire course of an antibiotic. Do not take these medications with alcohol. A steam or ultrasonic humidifier can help congestion.  You can place a towel over your head and breathe in the steam from hot water coming from a faucet. Avoid close contacts especially the very young and the elderly. Cover your mouth when you cough or sneeze. Always remember to wash your hands.  Get Help Right Away If: You develop worsening fever or sinus pain. You develop a severe head ache or visual changes. Your symptoms persist after you have completed your treatment  plan.  Make sure you Understand these instructions. Will watch your condition. Will get help right away if you are not doing well or get worse.  Your e-visit answers were reviewed by a board certified advanced clinical practitioner to complete your personal care plan.  Depending on the condition, your plan could have included both over the counter or prescription medications.  If there is a problem please reply  once you have received a response from your provider.  Your safety is important to .  If you have drug allergies check your prescription carefully.    You can use MyChart to ask questions about today's visit, request a non-urgent call back, or ask for a work or school excuse for 24 hours related to this e-Visit. If it has been greater than 24 hours you will need to follow up with your provider, or enter a new e-Visit to address those concerns.  You will get an e-mail in the next two days asking about your experience.  I hope that your e-visit has been valuable and will speed your recovery. Thank you for using e-visits.   I have spent at least 5 minutes reviewing and documenting in the patient's chart.

## 2020-10-12 ENCOUNTER — Ambulatory Visit: Payer: 59

## 2020-11-06 ENCOUNTER — Other Ambulatory Visit: Payer: Self-pay

## 2020-11-06 MED ORDER — CARESTART COVID-19 HOME TEST VI KIT
PACK | 0 refills | Status: DC
  Filled 2020-11-06: qty 2, 4d supply, fill #0

## 2020-11-08 ENCOUNTER — Encounter: Payer: Self-pay | Admitting: Internal Medicine

## 2020-11-09 ENCOUNTER — Other Ambulatory Visit: Payer: Self-pay

## 2020-11-09 DIAGNOSIS — Z3009 Encounter for other general counseling and advice on contraception: Secondary | ICD-10-CM

## 2021-01-03 ENCOUNTER — Other Ambulatory Visit: Payer: Self-pay

## 2021-01-03 MED ORDER — CARESTART COVID-19 HOME TEST VI KIT
PACK | 0 refills | Status: DC
  Filled 2021-01-03: qty 2, 4d supply, fill #0

## 2021-01-11 ENCOUNTER — Ambulatory Visit: Payer: 59 | Admitting: Dietician

## 2021-01-16 ENCOUNTER — Other Ambulatory Visit: Payer: Self-pay

## 2021-01-16 ENCOUNTER — Encounter: Payer: Self-pay | Admitting: Dietician

## 2021-01-16 ENCOUNTER — Encounter: Payer: 59 | Attending: Internal Medicine | Admitting: Dietician

## 2021-01-16 VITALS — Ht 62.0 in | Wt 160.7 lb

## 2021-01-16 DIAGNOSIS — Z3009 Encounter for other general counseling and advice on contraception: Secondary | ICD-10-CM | POA: Diagnosis not present

## 2021-01-16 DIAGNOSIS — Z713 Dietary counseling and surveillance: Secondary | ICD-10-CM | POA: Insufficient documentation

## 2021-01-16 NOTE — Patient Instructions (Signed)
Try aiming for average of 1200 calories daily; include small portions of whole grain, fruit, beans as healthy carbs.

## 2021-01-16 NOTE — Progress Notes (Signed)
Medical Nutrition Therapy: Visit start time: 1110  end time: 1155  Assessment:  Diagnosis: family planning Past medical history: none significant Psychosocial issues/ stress concerns: none  Preferred learning method:  Auditory Visual Hands-on   Current weight: 160.2lbs Height: 5'2" BMI: 29.39 Medications, supplements: reconciled list in medical record  Progress and evaluation:  Reports having metabolic rate tested 3 years ago, result was 1100kcal.  Labwork indicates TSH ranging from 0.867 - 1.1 over the past 3 years.Most recent result was 1.1 on 06/28/20. Patient states she has gained about 30lbs in recent month despite minimal if any changes to diet and activity. 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 Dining out frequency: 1 meals per week.  Breakfast: 2 homemade protein balls with oats, honey, sm amount chocolate chips, peanut butter/ powder Snack: Ghost protein supplement mixed with water after workouts, low sugar yogurt Lunch: salad with tuna (bigger meal) Snack: carrots with hummus Sometimes additional snack of protein bar Supper: fish + veg; bean burger  with cheese, no bun; grilled tuna; veg plate including beans occ with cheese; bean stew; falafel Snack: none Beverages: water, pre-workout drink; 1c coffee every other day; occasional cranberry juice; rarely glass of wine  Nutrition Care Education: Topics covered:  Basic nutrition: basic food groups, appropriate nutrient balance, appropriate meal and snack schedule, general nutrition guidelines     Weight control: importance of low sugar and low fat choices, portion control; estimated energy needs for weight loss at 1200-1500 kcal, and discussed possible effects of under-eating; roles of protein, carb, fat in metabolism, insulin secretion, muscle mass Fertility: importance of adequate nutrition including protein and healthy fats; maintaining nutrient stores for iron, calcium, vitamin D, vitamin B12, folic acid  Nutritional Diagnosis:  [place]-3.4 Unintentional weight gain As related to unknown etiology.  As evidenced by patient reported gain of 30lbs in 1 year with family history of hypothyroidism and low metabolic rate.  Intervention:  Instruction and discussion as noted above. Patient is currently following a healthy eating pattern, possibly low in calories to meet her needs. Established goal of gradually increasing calories to 1200 or more.  Encouraged utilization of stress management strategies, adequate sleep. No follow up scheduled at this time; patient to schedule later if needed.  Education Materials given:  Get Healthy with Mediterranean Style Eating Pregnancy Diet (essential nutrients)  -- [facility] Visit summary with goals/ instructions to be viewed via MyChart   Learner/ who was taught:  Patient    Level of understanding: Verbalizes/ demonstrates competency   Demonstrated degree of understanding via:   Teach back Learning barriers: None  Willingness to learn/ readiness for change: Eager, change in progress   Monitoring and Evaluation:  Dietary intake, exercise, and body weight      follow up: prn

## 2021-03-06 ENCOUNTER — Encounter: Payer: 59 | Admitting: Obstetrics and Gynecology

## 2021-03-14 NOTE — Progress Notes (Signed)
GYNECOLOGY PROGRESS NOTE  Subjective:    Patient ID: Susan Hoffman, female    DOB: 25-May-1990, 30 y.o.   MRN: [id number]  HPI  Patient is a 30 y.o. G0P0000 female who presents for the following:  Fertility concerns - has been trying for a year with no success.  Weight gain concerns - despite exercising regularly and dieting. Hair loss - has been ongoing for the past few months.  Fatigue - despite adequate rest at night.  Uterine cramping while not on menstrual cycle. Also had a history of irregular cycles after discontinuation of birth control but has now regulated over the past few months since use of Provera monthly.    The following portions of the patient's history were reviewed and updated as appropriate: allergies, current medications, past family history, past medical history, past social history, past surgical history, and problem list.  Review of Systems Pertinent items noted in HPI and remainder of comprehensive ROS otherwise negative.   Objective:   Blood pressure (!) 116/52, pulse 74, height 5\' 2"  (1.575 m), weight 160 lb 1.6 oz (72.6 kg), last menstrual period 02/22/2021.  Body mass index is 29.28 kg/m. General appearance: alert and no distress Remainder of exam deferred.    Labs:  Office Visit on 08/08/2020  Component Date Value Ref Range Status   LH 08/08/2020 9.2  mIU/mL Final   Comment:                     Adult Female:                       Follicular phase      2.4 -  12.6                       Ovulation phase      14.0 -  95.6                       Luteal phase          1.0 -  11.4                       Postmenopausal        7.7 -  58.5    FSH 08/08/2020 5.9  mIU/mL Final   Comment:                     Adult Female:                       Follicular phase      3.5 -  12.5                       Ovulation phase       4.7 -  21.5                       Luteal phase          1.7 -   7.7                       Postmenopausal       25.8 - 134.8     Estradiol 08/08/2020 105.0  pg/mL Final   Comment:  Adult Female:                       Follicular phase   12.5 -   166.0                       Ovulation phase    85.8 -   498.0                       Luteal phase       43.8 -   211.0                       Postmenopausal     <6.0 -    54.7                     Pregnancy                       1st trimester     215.0 - >4300.0 Roche ECLIA methodology    Progesterone 08/08/2020 <0.1  ng/mL Final   Comment:                      Follicular phase       0.1 -   0.9                      Luteal phase           1.8 -  23.9                      Ovulation phase        0.1 -  12.0                      Pregnant                         First trimester    11.0 -  44.3                         Second trimester   25.4 -  83.3                         Third trimester    58.7 - 214.0                      Postmenopausal         0.0 -   0.1    Prolactin 08/08/2020 37.3 (H)  4.8 - 23.3 ng/mL Final   HCG, Beta Chain, Quant, S 08/08/2020 <1  mIU/mL Final   Comment:                      Female (Non-pregnant)    0 -     5                             (Postmenopausal)  0 -     8                      Female (Pregnant)  Weeks of Gestation                              3                6 -    71                              4               10 -   750                              5              217 -  7138                              6              158 - 31795                              7             [id number]                              8            32065 -[phone]                              9            [phone] [phone]                             10            [id number] -[id number]                             12            [id number] -[phone]                             14            13950 - 62530                             15            12039 - 70971                             16             9040 - [phone]                              17             [phone]  18             8099 - [phone] Roche E                          CLIA methodology     Assessment:   1. Infertility counseling   2. Elevated prolactin level   3. Fatigue, unspecified type   4. Hair loss   5. Weight gain      Plan:   - Patient had unprotected intercourse for about a year with no conception.  She reported more normal menstrual periods over the past several months.   Patient also advised about LH/ovulation predictor kits, also advised to have timed intercourse at least every other day around the time of ovulation.  Will check and follow up on infertility workup labs and ultrasound, consider semen analysis then HSG.  Will continue to follow. - Had a history of an elevated prolactin level, will repeat labs today.  - Hair loss and weight gain, assess thyroid levels, consider vitamin check.    Patient to f/u after workup completed. Will notify of lab results by Mychart   A total of 25 minutes were spent face-to-face with the patient during this encounter and over half of that time involved counseling and coordination of care.   Hildred Laser, MD [facility]

## 2021-03-15 ENCOUNTER — Ambulatory Visit (INDEPENDENT_AMBULATORY_CARE_PROVIDER_SITE_OTHER): Payer: 59 | Admitting: Obstetrics and Gynecology

## 2021-03-15 ENCOUNTER — Encounter: Payer: Self-pay | Admitting: Obstetrics and Gynecology

## 2021-03-15 ENCOUNTER — Other Ambulatory Visit: Payer: Self-pay

## 2021-03-15 VITALS — BP 116/52 | HR 74 | Ht 62.0 in | Wt 160.1 lb

## 2021-03-15 DIAGNOSIS — R635 Abnormal weight gain: Secondary | ICD-10-CM | POA: Diagnosis not present

## 2021-03-15 DIAGNOSIS — R7989 Other specified abnormal findings of blood chemistry: Secondary | ICD-10-CM

## 2021-03-15 DIAGNOSIS — Z3169 Encounter for other general counseling and advice on procreation: Secondary | ICD-10-CM

## 2021-03-15 DIAGNOSIS — R5383 Other fatigue: Secondary | ICD-10-CM

## 2021-03-15 DIAGNOSIS — L659 Nonscarring hair loss, unspecified: Secondary | ICD-10-CM | POA: Diagnosis not present

## 2021-03-17 ENCOUNTER — Encounter: Payer: Self-pay | Admitting: Obstetrics and Gynecology

## 2021-03-17 NOTE — Patient Instructions (Signed)
Female Infertility Female infertility refers to a woman's inability to get pregnant (conceive) after a year of having sex regularly (or after 6 months in women over age 30) without using birth control. Infertility can also mean that a woman is not able to carry a pregnancy to full term. Both women and men can experience fertility problems. What are the causes? This condition may be caused by: Problems with reproductive organs. Infertility can result if a woman: Is not ovulating or is ovulating irregularly. Has a blockage or scarring in the fallopian tubes. Has uterine fibroids. This is a benign mass of tissue or muscle (tumor) that can develop in the uterus. Has an abnormally shaped uterus. Has an abnormally short cervix or a cervix that does not remain closed during a pregnancy. Certain medical conditions. These may include: Polycystic ovary syndrome (PCOS). This is a hormonal disorder that can cause small cysts to grow on the ovaries. This is the most common cause of infertility in women. Endometriosis. This is a condition in which the tissue that lines the uterus (endometrium) grows outside of its normal location. Cancer and cancer treatments, such as chemotherapy or radiation. Premature ovarian failure. This is when ovaries stop producing eggs and hormones before age 40. Sexually transmitted infections, such as chlamydia or gonorrhea. Autoimmune disorders. These are disorders in which the body's defense system (immune system) attacks normal, healthy cells. Infertility can be linked to more than one cause. For some women, the cause of infertility is not known (unexplained infertility). What increases the risk? The following factors may make you more likely to develop this condition: Age. A woman's fertility declines with age, especially after her mid-30s. Stress. Smoking. Being underweight or overweight. Drinking too much alcohol. Using drugs such as anabolic steroids, cocaine, and  marijuana. Exercising excessively. What are the signs or symptoms? The main sign of infertility in women is the inability to get pregnant or carry a pregnancy to full term. How is this diagnosed? This condition may be diagnosed by: Checking whether you are ovulating each month. The tests may include: Blood tests to check hormone levels. An ultrasound of the ovaries. Taking a sample of the tissue that lines the uterus and checking it under a microscope (endometrial biopsy). Doing additional tests. This is done if ovulation is normal. Tests may include: Hysterosalpingogram. This X-ray test can show the shape of the uterus and whether the fallopian tubes are open. Laparoscopy. This test uses a lighted tube (laparoscope) to look for problems in the fallopian tubes and other organs. Transvaginal ultrasound. This imaging test is used to check for abnormalities in the uterus and ovaries. Hysteroscopy. This test uses a lighted tube to check for problems in the cervix and the uterus. To be diagnosed with infertility, both partners will have a physical exam. Both partners will also have an extensive medical and sexual history taken. Additional tests may be done. How is this treated? Treatment depends on the cause of infertility. Most cases of infertility in women are treated with medicine or surgery. Women may take medicine to: Correct ovulation problems. Treat other health conditions. Surgery may be done to: Repair damage to the ovaries, fallopian tubes, cervix, or uterus. Remove growths from the uterus. Remove scar tissue from the uterus, pelvis, or other organs. Assisted reproductive technology (ART) Assisted reproductive technology (ART) refers to all treatments and procedures that combine eggs and sperm outside the body to try to help a couple conceive. ART is often combined with fertility drugs to stimulate ovulation.   Sometimes ART is done using eggs retrieved from another woman's body (donor  eggs) or from previously frozen fertilized eggs (embryos). There are different types of ART. These include: Intrauterine insemination (IUI). A long, thin tube is used to place sperm directly into a woman's uterus. This procedure: Is effective for infertility caused by sperm problems, including low sperm count and low motility. Can be used in combination with fertility drugs. In vitro fertilization (IVF). This is done when a woman's fallopian tubes are blocked or when a man has low sperm count. In this procedure: Fertility drugs are used to stimulate the ovaries to produce multiple eggs. Once mature, these eggs are removed from the body and combined with the sperm to be fertilized. The fertilized eggs are then placed into the woman's uterus. Follow these instructions at home: Lifestyle If you drink alcohol, limit how much you have to 0-1 drink a day. Do not use any products that contain nicotine or tobacco. These products include cigarettes, chewing tobacco, and vaping devices, such as e-cigarettes. If you need help quitting, ask your health care provider. Practice stress reduction techniques that work well for you, such as regular physical activity, meditation, or deep breathing. Make dietary changes to lose weight or maintain a healthy weight. Work with your health care provider and a dietitian to set a weight-loss goal that is healthy and reasonable for you. General instructions Take over-the-counter and prescription medicines only as told by your health care provider. Seek support from a counselor or support group to talk about your concerns related to infertility. Couples counseling may be helpful for you and your partner. Keep all follow-up visits. This is important. Where to find support Resolve - The National Infertility Association: resolve.org Contact a health care provider if: You feel that stress is interfering with your life and relationships. You have side effects from treatments  for infertility. Summary Female infertility refers to a woman's inability to get pregnant (conceive) after a year of having sex regularly (or after 6 months in women over age 30) without using birth control. To be diagnosed with infertility, both partners will have a physical exam. Both partners will also have an extensive medical and sexual history taken. Seek support from a counselor or support group to talk about your concerns related to infertility. Couples counseling may be helpful for you and your partner. This information is not intended to replace advice given to you by your health care provider. Make sure you discuss any questions you have with your health care provider. Document Revised: 06/12/2020 Document Reviewed: 06/12/2020 Elsevier Patient Education  2022 [employer]  

## 2021-03-17 NOTE — Addendum Note (Signed)
Addended by: Fabian November on: 03/17/2021 09:21 PM   Modules accepted: Orders

## 2021-03-18 ENCOUNTER — Other Ambulatory Visit: Payer: Self-pay

## 2021-03-18 ENCOUNTER — Ambulatory Visit
Admission: RE | Admit: 2021-03-18 | Discharge: 2021-03-18 | Disposition: A | Discharge status: Home / Self Care | Payer: 59 | Source: Ambulatory Visit | Attending: Obstetrics and Gynecology | Admitting: Obstetrics and Gynecology

## 2021-03-18 DIAGNOSIS — Z3169 Encounter for other general counseling and advice on procreation: Secondary | ICD-10-CM | POA: Insufficient documentation

## 2021-03-18 DIAGNOSIS — N854 Malposition of uterus: Secondary | ICD-10-CM | POA: Diagnosis not present

## 2021-03-25 ENCOUNTER — Encounter: Payer: Self-pay | Admitting: Obstetrics and Gynecology

## 2021-03-26 LAB — PCOS DIAGNOSTIC PROFILE
17-Alpha-Hydroxyprogesterone: 76 ng/dL
ANTI-MULLERIAN HORMONE (AMH): 1.07 ng/mL
DHEA-Sulfate, LCMS: 204 ug/dL
Estradiol, Serum, MS: 99 pg/mL
Follicle Stimulating Hormone: 3.8 m[IU]/mL
Free Testosterone, Serum: 1.8 pg/mL
Luteinizing Hormone (LH) ECL: 2.7 m[IU]/mL
Prolactin: 14.9 ng/mL
Sex Hormone Binding Globulin: 54.5 nmol/L
TSH: 1.1 uU/mL
Testosterone, Serum (Total): 25 ng/dL
Testosterone-% Free: 0.7 %

## 2021-03-26 LAB — PROLACTIN: Prolactin: 12.9 ng/mL (ref 4.8–23.3)

## 2021-03-29 ENCOUNTER — Telehealth (INDEPENDENT_AMBULATORY_CARE_PROVIDER_SITE_OTHER): Payer: 59 | Admitting: Obstetrics and Gynecology

## 2021-03-29 ENCOUNTER — Other Ambulatory Visit: Payer: Self-pay

## 2021-03-29 ENCOUNTER — Encounter: Payer: Self-pay | Admitting: Obstetrics and Gynecology

## 2021-03-29 VITALS — Ht 62.0 in | Wt 160.0 lb

## 2021-03-29 DIAGNOSIS — Z319 Encounter for procreative management, unspecified: Secondary | ICD-10-CM

## 2021-03-29 DIAGNOSIS — L659 Nonscarring hair loss, unspecified: Secondary | ICD-10-CM

## 2021-03-29 DIAGNOSIS — Z3169 Encounter for other general counseling and advice on procreation: Secondary | ICD-10-CM | POA: Diagnosis not present

## 2021-03-29 DIAGNOSIS — R635 Abnormal weight gain: Secondary | ICD-10-CM | POA: Diagnosis not present

## 2021-03-29 MED ORDER — CLOMIPHENE CITRATE 50 MG PO TABS
50.0000 mg | ORAL_TABLET | Freq: Every day | ORAL | 3 refills | Status: DC
  Filled 2021-03-29: qty 5, 5d supply, fill #0
  Filled 2021-06-07: qty 5, 5d supply, fill #1

## 2021-03-29 MED ORDER — MEDROXYPROGESTERONE ACETATE 10 MG PO TABS
10.0000 mg | ORAL_TABLET | Freq: Every day | ORAL | 2 refills | Status: DC
  Filled 2021-03-29: qty 10, 10d supply, fill #0

## 2021-03-29 NOTE — Progress Notes (Signed)
[facility] [address] [place], [place]  [address] Phone:  [phone]   Fax:  [phone]   Virtual Visit via Video Note  I connected with Susan Hoffman on 03/29/21 at  4:30 PM EST by a video enabled telemedicine application and verified that I am speaking with the correct person using two identifiers.  Location: Patient: Home Provider: Office   I discussed the limitations of evaluation and management by telemedicine and the availability of in person appointments. The patient expressed understanding and agreed to proceed.  History of Present Illness: 30 y.o.  G0P0000  who presents for discussion of next steps with regards to infertility.  Had recent labs and ultrasound performed for evaluation of PCOS (as well as weight gain despite exercising regularly, and hair loss). Also had a previously elevated prolactin level earlier in the year. Denies complaints today.      Observations/Objective: Height 5' 2"  (1.575 m), weight 160 lb (72.6 kg), last menstrual period 03/20/2021. Gen App: NAD Remainder of exam deferred   Labs:  Office Visit on 03/15/2021  Component Date Value Ref Range Status   TSH 03/15/2021 1.1  uU/mL Final   Comment: Reference Range: Non-Pregnant Adult 0.450-4.500 Pregnancy First Trimester 0.100-4.000 Second Trimester 0.200-4.000 Third Trimester 0.300-4.500    Testosterone, Serum (Total) 03/15/2021 25  ng/dL Final   Comment: This test was developed and its performance characteristics determined by [facility]. It has not been cleared or approved by the Food and Drug Administration. Reference Range: Adult Females   Premenopausal  10 - 55   Postmenopausal  7 - 40    Testosterone-% Free 03/15/2021 0.7  % Final   Comment: This test was developed and its performance characteristics determined by [facility]. It has not been cleared or approved by the Food and Drug Administration. Reference Range: Adult Females: 0.8 - 1.4     Free Testosterone, Serum 03/15/2021 1.8  pg/mL Final   Comment: Reference Range: Adult Females: 1.1 - 6.3    Luteinizing Hormone (LH) ECL 03/15/2021 2.7  mIU/mL Final   Comment: This test was developed and its performance characteristics determined by [facility]. It has not been cleared or approved by the Food and Drug Administration. Reference Range: Follicular: 2 - 9 Mid-cycle peak: 18 - 49 Luteal: 2 - 11    Follicle Stimulating Hormone 03/15/2021 3.8  mIU/mL Final   Comment: This test was developed and its performance characteristics determined by [facility]. It has not been cleared or approved by the Food and Drug Administration. Reference Range: Follicular and Luteal: 1.8 - 11.2 Mid-cycle Peak: 6 - 35    Prolactin 03/15/2021 14.9  ng/mL Final   Comment: Hook effect or prozone effect has been ruled out by performing additional dilution analysis on all prolactin testing.  This test was developed and its performance characteristics determined by [facility]. It has not been cleared or approved by the Food and Drug Administration. Reference Range: Female Children and Adults: 4.79 - 23.3    17-Alpha-Hydroxyprogesterone 03/15/2021 76  ng/dL Final   Comment: This test was developed and its performance characteristics determined by [facility]. It has not been cleared or approved by the Food and Drug Administration. Reference Range: Adult Female  Follicular: 15 - 70  Luteal: 35 - 290    DHEA-Sulfate, LCMS 03/15/2021 204  ug/dL Final   Comment: This test was developed and its performance characteristics determined by [facility]. It has not been cleared or approved by the Food and Drug Administration. Reference Range: Adult  Females (21 - 30y): 22 - 372    Sex Hormone Binding Globulin 03/15/2021 54.5  nmol/L Final   Comment: Reference Range: Pubertal: 36.0 - 125.0 20 - 49y: 24.6 - 122.0 >49y:     17.3 - 125.0    Estradiol, Serum, MS 03/15/2021 99  pg/mL Final   Comment: This test was  developed and its performance characteristics determined by [facility]. It has not been cleared or approved by the Food and Drug Administration. Reference Range: Adult Females  Follicular: 30 - 150  Luteal: 70 - 300  Postmenopausal: <15    ANTI-MULLERIAN HORMONE (AMH) 03/15/2021 1.07  ng/mL Final   Comment: For assays employing antibodies, the possibility exists for interference by heterophile antibodies in the samples.1  1.Kricka L.  Interferences in Immunoassays - still a threat.  Clin. Chem. 2000; 46: 5697-9480.  This test was developed and its performance characteristics determined by [facility]. It has not been cleared or approved by the Food and Drug Administration. Reference Range: Females 26 - 30y: 1.03 - 11.10 Median 4.20 AMH concentrations of >= 1.06 ng/mL is correlated with a better response to ovarian stimulation, produced more retrievable oocytes and higher odds of live birth according to [place].  Buelah Manis and Sterility. 2010: 16:5537-4827.  The current AMH test method correlates with the study method with a slope of 0.94. Females at risk of ovarian hyperstimulation syndrome or polycystic ovarian syndrome (PCOS) may exhibit elevated serum AMH concentrations.   AMH levels from PCOS patients may be 2 to 5 fold higher than age-appropriate reference in                          terval values. Granulosa cell tumors of the ovary may secrete AMH along with other tumor markers.  Elevated AMH is not specific for malignancy, and the assay should not be used exclusively to diagnose or exclude an AMH-secreting ovarian tumor.    Prolactin 03/15/2021 12.9  4.8 - 23.3 ng/mL Final     Imaging:  US PELVIC COMPLETE WITH TRANSVAGINAL CLINICAL DATA:  In fertility counseling, G0, LMP 02/22/2021  EXAM: TRANSABDOMINAL AND TRANSVAGINAL ULTRASOUND OF PELVIS  TECHNIQUE: Both transabdominal and transvaginal ultrasound examinations of the pelvis were performed. Transabdominal  technique was performed for global imaging of the pelvis including uterus, ovaries, adnexal regions, and pelvic cul-de-sac. It was necessary to proceed with endovaginal exam following the transabdominal exam to visualize the uterus, endometrium, and ovaries.  COMPARISON:  None  FINDINGS: Uterus  Measurements: 6.2 x 2.3 x 3.6 cm = volume: 27 mL. Anteverted. Normal morphology without mass  Endometrium  Thickness: 6 mm.  No endometrial fluid or mass  Right ovary  Measurements: 1.9 x 2.1 x 1.4 cm = volume: 3.0 mL. Normal morphology without mass  Left ovary  Measurements: 2.8 x 1.1 x 2.2 cm = volume: 3.3 mL. Normal morphology without mass  Other findings  No free pelvic fluid.  No adnexal masses.  IMPRESSION: Normal exam.  Electronically Signed   By: Lavonia Dana M.D.   On: 03/18/2021 17:38   Assessment and Plan:  1. Infertility management   2. Hair loss   3. Weight gain     - Reviewed ultrasound findings and labs with patient. No evidence of PCOS.  Discussed that patient most likely has anovulatory cycles.  Discussed use of ovulation induction medications.  Also recommend for partner to have semen analysis.  Will leave collection kit at front desk with instructions. Will  prescribe Clomid. Also will prescribe Provera if cycles become irregular. To return for Day 21 progesterone. Is already taking PNV. - Advised to discuss with PCP regarding other possible causes of weight gain and hair loss.   Follow Up Instructions:    I discussed the assessment and treatment plan with the patient. The patient was provided an opportunity to ask questions and all were answered. The patient agreed with the plan and demonstrated an understanding of the instructions.   The patient was advised to call back or seek an in-person evaluation if the symptoms worsen or if the condition fails to improve as anticipated.  I provided 7 minutes of non-face-to-face time during this  encounter.   Rubie Maid, MD [facility]

## 2021-03-29 NOTE — Patient Instructions (Addendum)
Clomid challenge/therapy Given that patient has anovulatory cycles, discussed Clomid challenge/therapy.  She was given Rx for Provera 10mg  po qd x 10 days.   She was told to watch for withdrawal bleeding after the course, the first day of bleeding will be Day 1 for her next cycle.  She was also given a Rx for Clomid 50mg  po qd to take on days 5-9 of her cycle.  The risks of Clomid including ovarian hyperstimulation with possible risk of ovarian cancer as well as multiple gestation were discussed with patient.  Patient also advised to continue frequent intercourse especially around Day 14 of her upcoming cycle (qod intercourse around days 9 - 18).  By Day 35, patient was told to call if she had her period.  If patient has bleeding at end of cycle, will increase Clomid by 50mg  for the following cycle; she can have a total of 6 cycles.  However if patient does not have bleeding, she was told to do a pregnancy test/come in for evaluation.   CLOMID PATIENT INSTRUCTIONS  WHY USE IT? Clomid helps your ovaries to release eggs (ovulate).  HOW TO USE IT? Clomid is taken as a pill usually on days 5,6,7,8, & 9 of your cycle.  Day 1 is the first day of your period. The dose or duration may be changed to achieve ovulation.  Provera (progesterone) may first be used to bring on a period for some patients.  If you do not get pregnant this cycle, for your next cycles, take on days 3, 4, 5, 6 and 7.  If you do not get a period, take Provera 10 mg daily for 10 days to bring on a period; the first day you get bleeding is Day 1 of your cycle. The day of ovulation on Clomid is usually between cycle day 14 and 17.  Having sexual intercourse at least every other day between cycle day 13 and 18 will improve your chances of becoming pregnant during the Clomid cycle.  You may monitor your ovulation using basal body temperature charts or with ovulation kits. If using the ovulation predictor kits, having intercourse the day of the  surge and the two days following is recommended. You can also come in for a measurement of a Day 21 progesterone level to assess for ovulation. If you get your period, call when it starts for an appointment with your doctor, so that an exam may be done, and another Clomid cycle can be considered if appropriate. If you do not get a period by day 35 of the cycle, please get a blood pregnancy test.  If it is negative, speak to your doctor for instructions to bring on another period and to plan a follow-up appointment.  THINGS TO KNOW: If you get pregnant while using Clomid, your chance of twins is 7% and triplets is less than 1%. Some studies have suggested the use of "fertility drugs" may increase your risk of ovarian cancers in the future.  It is unclear if these drugs increase the risk, or people who have problems with fertility are prone for these cancers.  If there is an actual risk, it is very low.  If you have a history of liver problems or ovarian cancer, it may be wise to avoid this medication.  SIDE EFFECTS: The most common side effect is hot flashes (20%). Breast tenderness, headaches, nausea, bloating may also occur at different times. Less than 3/1,000 people have dryness or loss of hair. Persistent ovarian cysts may  form from the use of this medication. Ovarian hyperstimulation syndrome is a rare side effect at low doses. Visual changes like flashes of light or blurring.

## 2021-04-01 ENCOUNTER — Other Ambulatory Visit: Payer: Self-pay

## 2021-04-12 ENCOUNTER — Encounter: Payer: Self-pay | Admitting: Obstetrics and Gynecology

## 2021-04-26 ENCOUNTER — Encounter: Payer: 59 | Admitting: Certified Nurse Midwife

## 2021-05-02 ENCOUNTER — Other Ambulatory Visit: Payer: Self-pay | Admitting: Obstetrics and Gynecology

## 2021-05-02 ENCOUNTER — Telehealth: Payer: Self-pay | Admitting: Obstetrics and Gynecology

## 2021-05-02 ENCOUNTER — Other Ambulatory Visit
Admission: RE | Admit: 2021-05-02 | Discharge: 2021-05-02 | Disposition: A | Discharge status: Home / Self Care | Payer: 59 | Attending: Obstetrics and Gynecology | Admitting: Obstetrics and Gynecology

## 2021-05-02 DIAGNOSIS — Z319 Encounter for procreative management, unspecified: Secondary | ICD-10-CM | POA: Insufficient documentation

## 2021-05-02 NOTE — Telephone Encounter (Signed)
LM making pt aware

## 2021-05-02 NOTE — Telephone Encounter (Signed)
Please let patient know I placed a new order (had placed a future at her last visit but guess they can't see it).

## 2021-05-02 NOTE — Telephone Encounter (Signed)
Pt states that she was suppose to have labs done today, she is at [facility] and states she is suppose to be having her progesterone checked day 21. Please advise.

## 2021-05-02 NOTE — Progress Notes (Signed)
Please let patient know that I had placed the order as a future order at her last appointment, however if they cannot see it, I will place another one today.    Dr. Valentino Saxon

## 2021-05-03 LAB — PROGESTERONE: Progesterone: 7.5 ng/mL

## 2021-05-13 ENCOUNTER — Encounter: Payer: Self-pay | Admitting: Obstetrics and Gynecology

## 2021-05-19 ENCOUNTER — Encounter: Payer: Self-pay | Admitting: Internal Medicine

## 2021-05-27 ENCOUNTER — Other Ambulatory Visit: Payer: Self-pay | Admitting: Certified Nurse Midwife

## 2021-05-27 ENCOUNTER — Encounter: Payer: Self-pay | Admitting: Obstetrics and Gynecology

## 2021-05-28 DIAGNOSIS — Z20822 Contact with and (suspected) exposure to covid-19: Secondary | ICD-10-CM | POA: Diagnosis not present

## 2021-06-07 ENCOUNTER — Other Ambulatory Visit: Payer: Self-pay

## 2021-06-27 IMAGING — CR DG ANKLE COMPLETE 3+V*R*
3 series · 3 of 3 positions shown · non-contrast
Comparison: None.

CLINICAL DATA: Medial right ankle pain history of fall

EXAM:
RIGHT ANKLE - COMPLETE 3+ VIEW

[ankle ap]
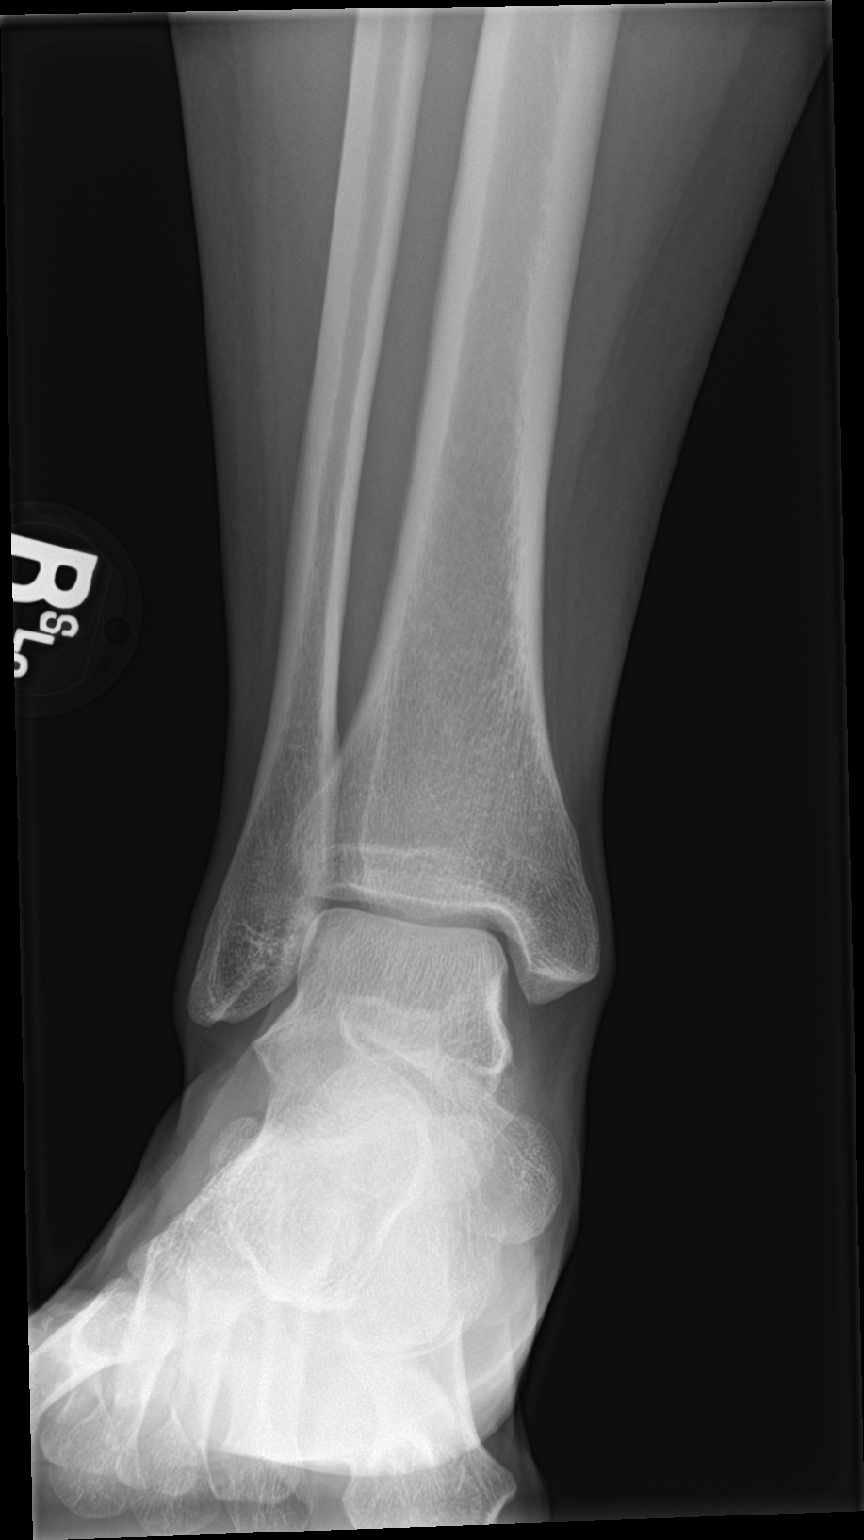

[ankle obl]
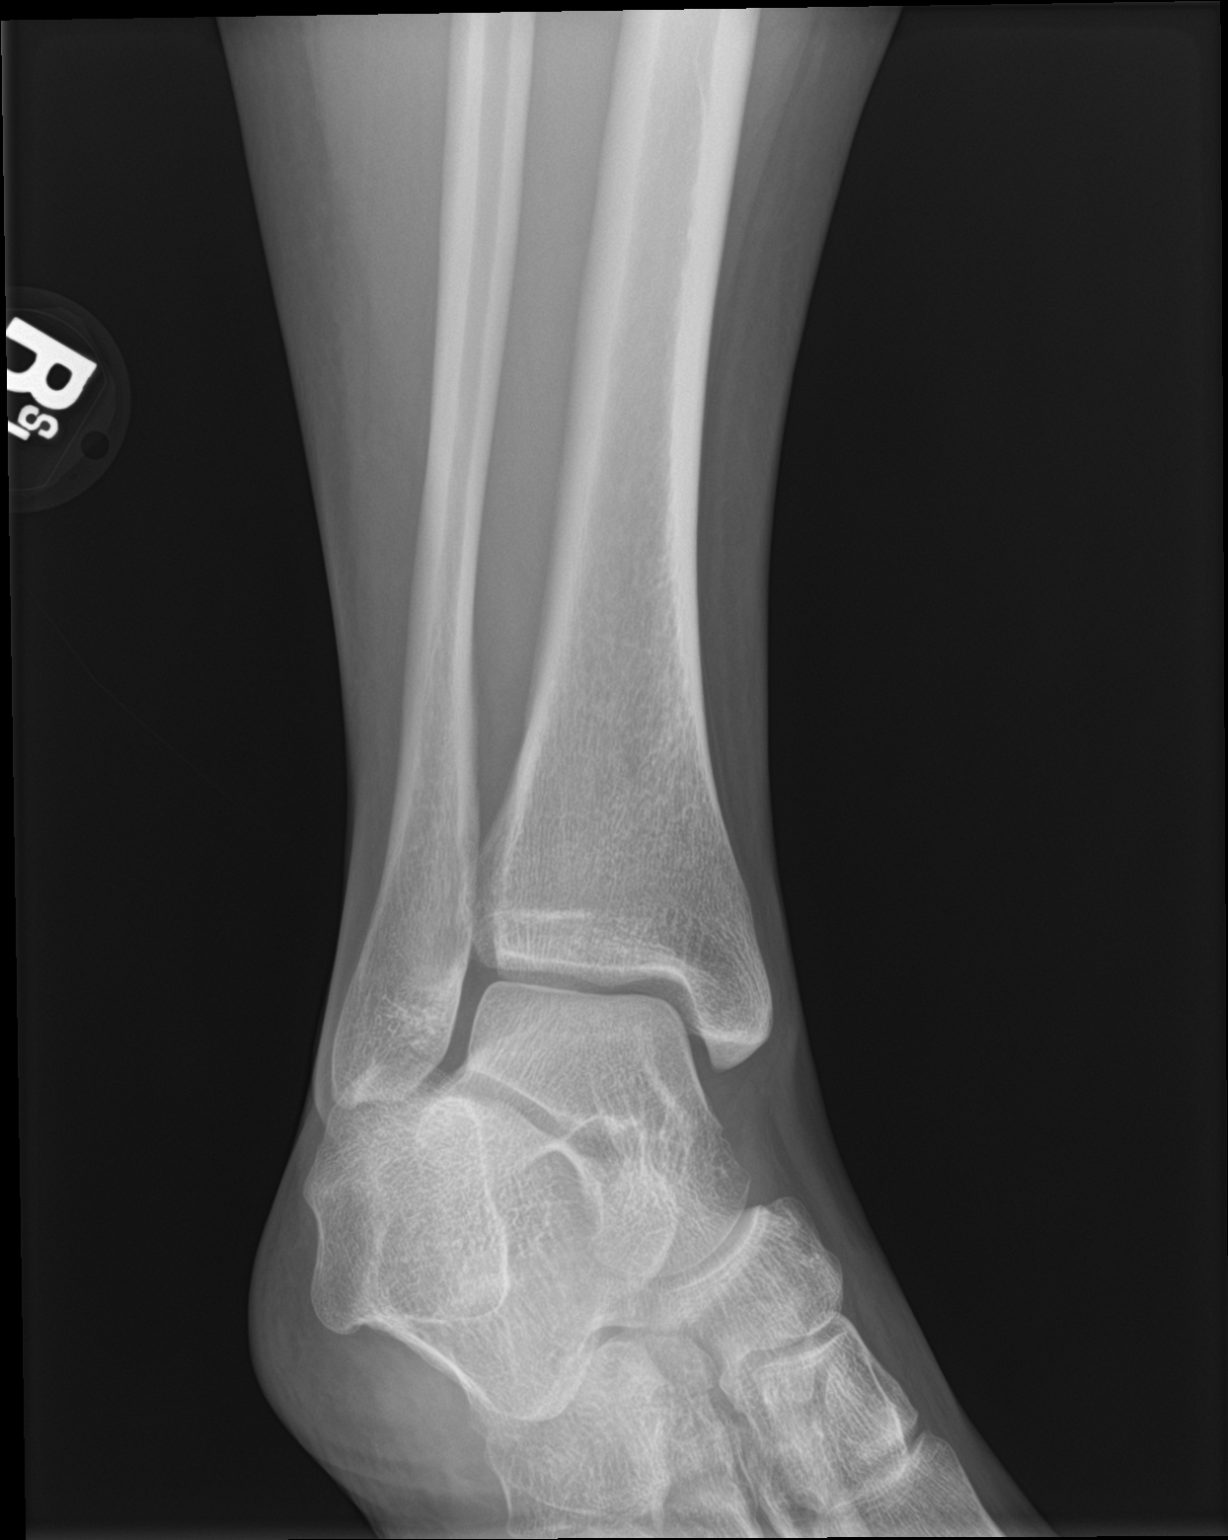

[ankle lat]
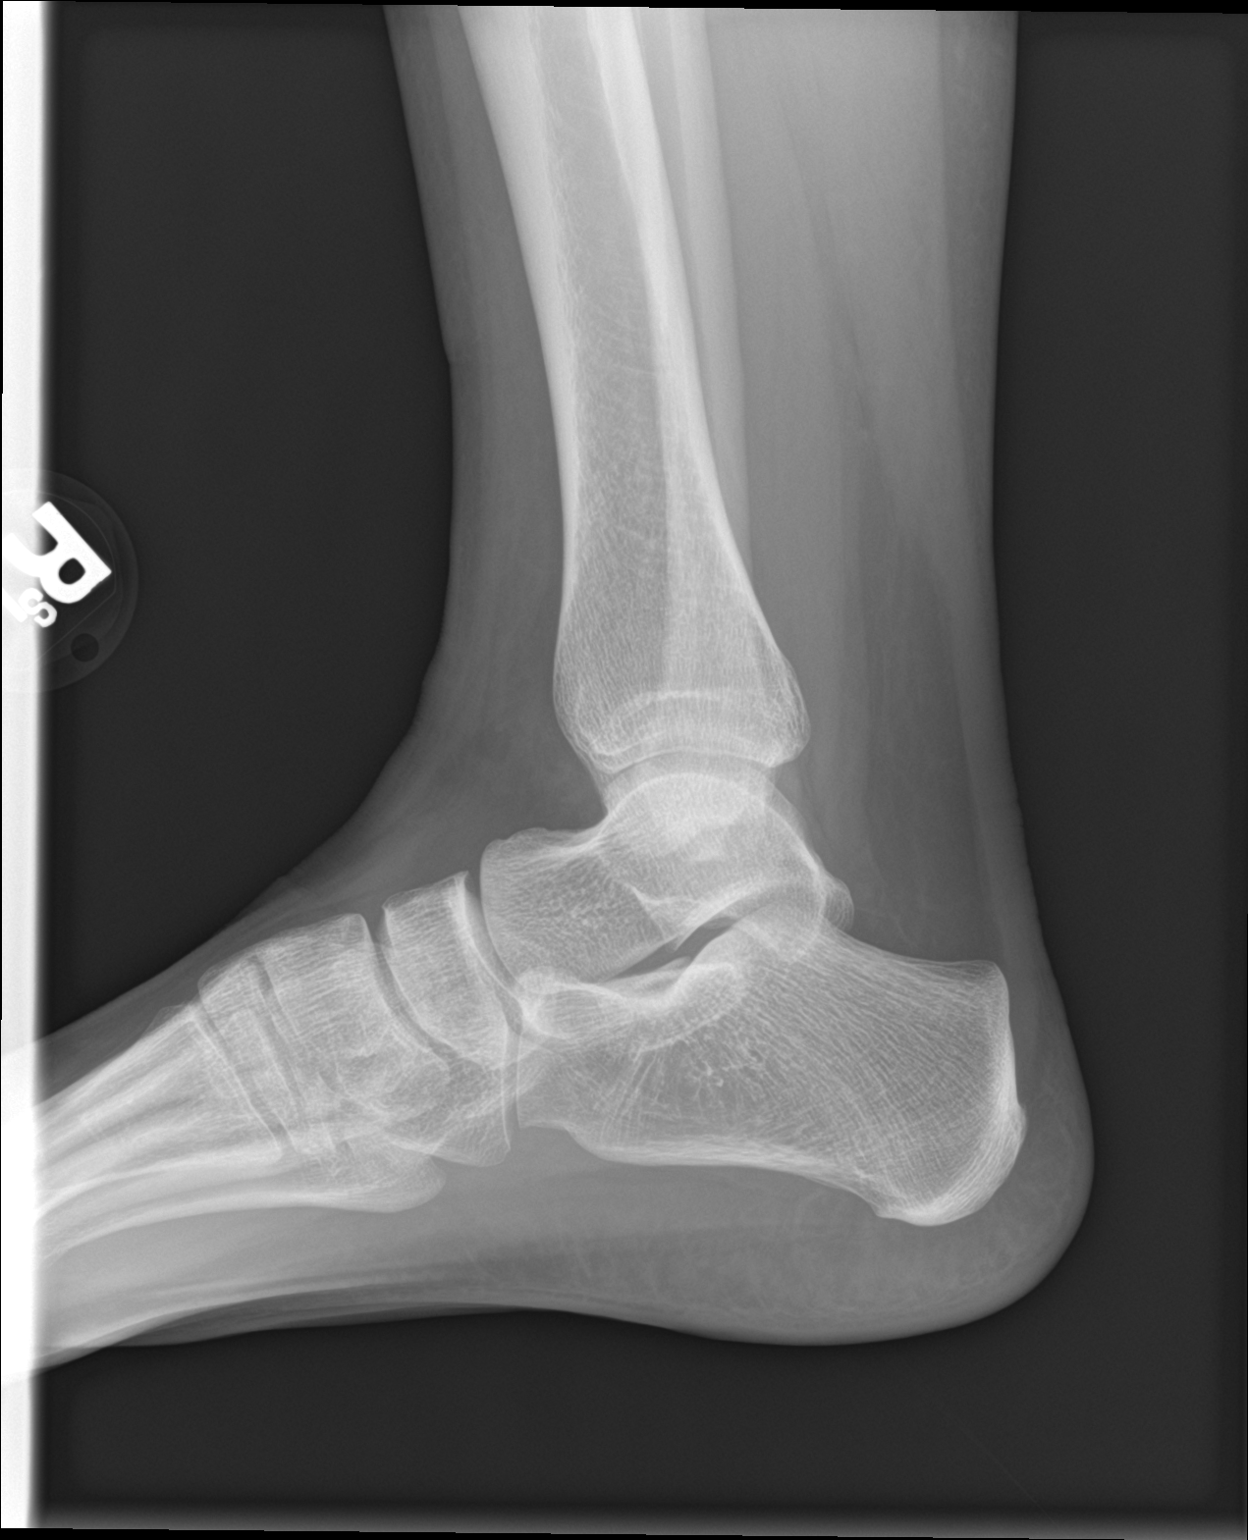

[3 of 3 positions shown; findings below may reference images not displayed]

FINDINGS: There is no evidence of fracture, dislocation, or joint effusion.
There is no evidence of arthropathy or other focal bone abnormality.
Soft tissues are unremarkable.
IMPRESSION: Negative.

## 2021-07-03 ENCOUNTER — Ambulatory Visit (INDEPENDENT_AMBULATORY_CARE_PROVIDER_SITE_OTHER): Payer: 59 | Admitting: Internal Medicine

## 2021-07-03 ENCOUNTER — Encounter: Payer: Self-pay | Admitting: Internal Medicine

## 2021-07-03 ENCOUNTER — Other Ambulatory Visit: Payer: Self-pay

## 2021-07-03 VITALS — BP 122/78 | HR 62 | Ht 62.0 in | Wt 162.2 lb

## 2021-07-03 DIAGNOSIS — Z1159 Encounter for screening for other viral diseases: Secondary | ICD-10-CM | POA: Diagnosis not present

## 2021-07-03 DIAGNOSIS — Z0000 Encounter for general adult medical examination without abnormal findings: Secondary | ICD-10-CM | POA: Diagnosis not present

## 2021-07-03 DIAGNOSIS — R3915 Urgency of urination: Secondary | ICD-10-CM | POA: Diagnosis not present

## 2021-07-03 LAB — POCT URINALYSIS DIPSTICK
Bilirubin, UA: NEGATIVE
Blood, UA: NEGATIVE
Glucose, UA: NEGATIVE
Ketones, UA: NEGATIVE
Leukocytes, UA: NEGATIVE
Nitrite, UA: NEGATIVE
Protein, UA: POSITIVE — AB
Spec Grav, UA: 1.01 (ref 1.010–1.025)
Urobilinogen, UA: 0.2 E.U./dL
pH, UA: 7 (ref 5.0–8.0)

## 2021-07-03 NOTE — Progress Notes (Signed)
? ? ?Date:  07/03/2021  ? ?Name:  Susan Hoffman   DOB:  August 20, 1990   MRN:  [id number] ? ? ?Chief Complaint: Annual Exam (Breast Exam, No pap.) ?Susan Hoffman is a 31 y.o. female who presents today for her Complete Annual Exam. She feels well. She reports exercising - 5 days a week. She reports she is sleeping well. Breast complaints - none.  Currently taking Clomid to help with conception. ? ?Mammogram: none ?DEXA: none ?Pap smear: 06/2020 neg with co-testing ?Colonoscopy: none ? ?Immunization History  ?Administered Date(s) Administered  ? Hepatitis A 10/18/2009  ? Hepatitis B 03/05/1997, 05/15/1997, 09/22/1997  ? Hepatitis B, adult 03/31/2014  ? Influenza-Unspecified 02/15/2019, 01/27/2020, 02/26/2021  ? MMR 07/11/1992, 09/02/1996  ? Meningococcal Conjugate 10/18/2009  ? PFIZER(Purple Top)SARS-COV-2 Vaccination 04/29/2019, 05/19/2019, 03/09/2020  ? PPD Test 09/01/2013  ? Tdap 10/18/2009, 06/23/2018  ? ? ?Urinary Frequency  ?This is a new problem. The current episode started 1 to 4 weeks ago. The problem occurs every urination. The problem has been unchanged. The pain is mild. There has been no fever. Associated symptoms include frequency. Pertinent negatives include no chills or vomiting. She has tried increased fluids for the symptoms. The treatment provided no relief.  ? ?Lab Results  ?Component Value Date  ? NA 139 06/28/2020  ? K 4.7 06/28/2020  ? CO2 20 06/28/2020  ? GLUCOSE 73 06/28/2020  ? BUN 10 06/28/2020  ? CREATININE 0.92 06/28/2020  ? CALCIUM 9.3 06/28/2020  ? EGFR 86 06/28/2020  ? GFRNONAA 79 06/27/2019  ? ?Lab Results  ?Component Value Date  ? CHOL 166 06/28/2020  ? HDL 56 06/28/2020  ? [place] 97 06/28/2020  ? TRIG 64 06/28/2020  ? CHOLHDL 3.0 06/28/2020  ? ?Lab Results  ?Component Value Date  ? TSH 0.867 06/28/2020  ? ?No results found for: HGBA1C ?Lab Results  ?Component Value Date  ? WBC 6.8 06/28/2020  ? HGB 14.5 06/28/2020  ? HCT 42.6 06/28/2020  ? MCV 94 06/28/2020  ? PLT 314  06/28/2020  ? ?Lab Results  ?Component Value Date  ? ALT 14 06/28/2020  ? AST 22 06/28/2020  ? ALKPHOS 58 06/28/2020  ? BILITOT 0.5 06/28/2020  ? ?No results found for: 25OHVITD2, [place], VD25OH  ? ?Review of Systems  ?Constitutional:  Negative for chills, fatigue and fever.  ?HENT:  Negative for congestion, hearing loss, tinnitus, trouble swallowing and voice change.   ?Eyes:  Negative for visual disturbance.  ?Respiratory:  Negative for cough, chest tightness, shortness of breath and wheezing.   ?Cardiovascular:  Negative for chest pain, palpitations and leg swelling.  ?Gastrointestinal:  Negative for abdominal pain, constipation, diarrhea and vomiting.  ?Endocrine: Negative for polydipsia and polyuria.  ?Genitourinary:  Positive for frequency. Negative for dysuria, genital sores, vaginal bleeding and vaginal discharge.  ?Musculoskeletal:  Negative for arthralgias, gait problem and joint swelling.  ?Skin:  Negative for color change and rash.  ?Neurological:  Negative for dizziness, tremors, light-headedness and headaches.  ?Hematological:  Negative for adenopathy. Does not bruise/bleed easily.  ?Psychiatric/Behavioral:  Negative for dysphoric mood and sleep disturbance. The patient is not nervous/anxious.   ? ?Patient Active Problem List  ? Diagnosis Date Noted  ? Ankle pain, right 10/11/2019  ? Abnormal Pap smear of cervix 02/13/2017  ? Attention deficit hyperactivity disorder (ADHD), predominantly hyperactive type 02/13/2017  ? 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 ? ?Allergies  ?Allergen Reactions  ? Covid-19 (Mrna) Vaccine Anaphylaxis, Other (  See Comments) and Shortness Of Breath  ? Cefdinir Diarrhea  ? Codeine   ? Mushroom Extract Complex   ? ? ?Past Surgical History:  ?Procedure Laterality Date  ? nexplanon  2017  ? TYMPANOPLASTY    ? wisdom teeth removal    ? ? ?Social History  ? ?Tobacco Use  ? Smoking status: Never  ? Smokeless tobacco: Never  ?Vaping Use  ? Vaping Use:  Never used  ?Substance Use Topics  ? Alcohol use: Not Currently  ?  Alcohol/week: 0.0 - 1.0 standard drinks  ?  Comment: 1 glass wine every 2 weeks  ? Drug use: No  ? ? ? ?Medication list has been reviewed and updated. ? ?Current Meds  ?Medication Sig  ? ASHWAGANDHA PO Take by mouth.  ? clomiPHENE (CLOMID) 50 MG tablet Take 1 tablet (50 mg total) by mouth daily. Take on days 5-9 of your period  ? medroxyPROGESTERone (PROVERA) 10 MG tablet Take 1 tablet (10 mg total) by mouth daily. Use for ten days  ? Prenatal Vit-Fe Fumarate-FA (PRENATAL MULTIVITAMIN) TABS tablet Take 1 tablet by mouth daily at 12 noon.  ? ? ?PHQ 2/9 Scores 07/03/2021 06/28/2020 06/12/2020 01/27/2020  ?PHQ - 2 Score 0 0 0 1  ?PHQ- 9 Score 2 0 0 3  ? ? ?GAD 7 : Generalized Anxiety Score 07/03/2021 06/28/2020 06/12/2020 01/27/2020  ?Nervous, Anxious, on Edge 0 0 0 0  ?Control/stop worrying 0 0 0 1  ?Worry too much - different things 0 0 0 0  ?Trouble relaxing 0 0 0 1  ?Restless 0 0 0 0  ?Easily annoyed or irritable 0 0 0 1  ?Afraid - awful might happen 0 0 0 0  ?Total GAD 7 Score 0 0 0 3  ?Anxiety Difficulty Not difficult at all - Not difficult at all -  ? ? ?BP Readings from Last 3 Encounters:  ?07/03/21 122/78  ?03/15/21 (!) 116/52  ?08/08/20 110/74  ? ? ?Physical Exam ?Vitals and nursing note reviewed.  ?Constitutional:   ?   General: She is not in acute distress. ?   Appearance: She is well-developed.  ?HENT:  ?   Head: Normocephalic and atraumatic.  ?   Right Ear: Tympanic membrane and ear canal normal.  ?   Left Ear: Tympanic membrane and ear canal normal.  ?   Nose:  ?   Right Sinus: No maxillary sinus tenderness.  ?   Left Sinus: No maxillary sinus tenderness.  ?Eyes:  ?   General: No scleral icterus.    ?   Right eye: No discharge.     ?   Left eye: No discharge.  ?   Conjunctiva/sclera: Conjunctivae normal.  ?Neck:  ?   Thyroid: No thyromegaly.  ?   Vascular: No carotid bruit.  ?Cardiovascular:  ?   Rate and Rhythm: Normal rate and regular rhythm.  ?    Pulses: Normal pulses.  ?   Heart sounds: Normal heart sounds.  ?Pulmonary:  ?   Effort: Pulmonary effort is normal. No respiratory distress.  ?   Breath sounds: No wheezing.  ?Chest:  ?Breasts: ?   Right: No mass, nipple discharge, skin change or tenderness.  ?   Left: No mass, nipple discharge, skin change or tenderness.  ?Abdominal:  ?   General: Bowel sounds are normal.  ?   Palpations: Abdomen is soft.  ?   Tenderness: There is abdominal tenderness in the suprapubic area. There is no right CVA tenderness  or left CVA tenderness.  ?Musculoskeletal:  ?   Cervical back: Normal range of motion. No erythema.  ?   Right lower leg: No edema.  ?   Left lower leg: No edema.  ?Lymphadenopathy:  ?   Cervical: No cervical adenopathy.  ?Skin: ?   General: Skin is warm and dry.  ?   Findings: No rash.  ?Neurological:  ?   Mental Status: She is alert and oriented to person, place, and time.  ?   Cranial Nerves: No cranial nerve deficit.  ?   Sensory: No sensory deficit.  ?   Deep Tendon Reflexes: Reflexes are normal and symmetric.  ?Psychiatric:     ?   Attention and Perception: Attention normal.     ?   Mood and Affect: Mood normal.  ? ? ?Wt Readings from Last 3 Encounters:  ?07/03/21 162 lb 3.2 oz (73.6 kg)  ?03/29/21 160 lb (72.6 kg)  ?03/15/21 160 lb 1.6 oz (72.6 kg)  ? ? ?BP 122/78   Pulse 62   Ht 5' 2"  (1.575 m)   Wt 162 lb 3.2 oz (73.6 kg)   LMP 06/11/2021 (Exact Date)   SpO2 97%   BMI 29.67 kg/m?  ? ?Assessment and Plan: ?1. Annual physical exam ?Normal exam ?Continue healthy diet and exercise ?Currently trying clomid for ovulation/conception ?- CBC with Differential/Platelet ?- Comprehensive metabolic panel ?- Lipid panel ?- TSH ? ?2. Urinary urgency ?UA negative so sx are likely bladder spasm or pelvic floor irritability - will consider PT ?- POCT urinalysis dipstick ? ?3. Need for hepatitis C screening test ?- Hepatitis C antibody ? ? ?Partially dictated using Editor, commissioning. Any errors are  unintentional. ? ?Halina Maidens, MD ?[facility] ?[facility] ? ?07/03/2021 ? ? ? ? ? ?

## 2021-07-04 LAB — CBC WITH DIFFERENTIAL/PLATELET
Basophils Absolute: 0 10*3/uL (ref 0.0–0.2)
Basos: 1 %
EOS (ABSOLUTE): 0.1 10*3/uL (ref 0.0–0.4)
Eos: 1 %
Hematocrit: 40.8 % (ref 34.0–46.6)
Hemoglobin: 13.5 g/dL (ref 11.1–15.9)
Immature Grans (Abs): 0 10*3/uL (ref 0.0–0.1)
Immature Granulocytes: 0 %
Lymphocytes Absolute: 2.4 10*3/uL (ref 0.7–3.1)
Lymphs: 37 %
MCH: 30.4 pg (ref 26.6–33.0)
MCHC: 33.1 g/dL (ref 31.5–35.7)
MCV: 92 fL (ref 79–97)
Monocytes Absolute: 0.4 10*3/uL (ref 0.1–0.9)
Monocytes: 6 %
Neutrophils Absolute: 3.5 10*3/uL (ref 1.4–7.0)
Neutrophils: 55 %
Platelets: 318 10*3/uL (ref 150–450)
RBC: 4.44 x10E6/uL (ref 3.77–5.28)
RDW: 11.8 % (ref 11.7–15.4)
WBC: 6.3 10*3/uL (ref 3.4–10.8)

## 2021-07-04 LAB — COMPREHENSIVE METABOLIC PANEL
ALT: 14 IU/L (ref 0–32)
AST: 19 IU/L (ref 0–40)
Albumin/Globulin Ratio: 2.2 (ref 1.2–2.2)
Albumin: 4.4 g/dL (ref 3.9–5.0)
Alkaline Phosphatase: 58 IU/L (ref 44–121)
BUN/Creatinine Ratio: 16 (ref 9–23)
BUN: 14 mg/dL (ref 6–20)
Bilirubin Total: 0.6 mg/dL (ref 0.0–1.2)
CO2: 22 mmol/L (ref 20–29)
Calcium: 9.3 mg/dL (ref 8.7–10.2)
Chloride: 104 mmol/L (ref 96–106)
Creatinine, Ser: 0.85 mg/dL (ref 0.57–1.00)
Globulin, Total: 2 g/dL (ref 1.5–4.5)
Glucose: 93 mg/dL (ref 70–99)
Potassium: 4.4 mmol/L (ref 3.5–5.2)
Sodium: 140 mmol/L (ref 134–144)
Total Protein: 6.4 g/dL (ref 6.0–8.5)
eGFR: 94 mL/min/{1.73_m2} (ref >59)

## 2021-07-04 LAB — TSH: TSH: 1 u[IU]/mL (ref 0.450–4.500)

## 2021-07-04 LAB — LIPID PANEL
Chol/HDL Ratio: 3.2 ratio (ref 0.0–4.4)
Cholesterol, Total: 176 mg/dL (ref 100–199)
HDL: 55 mg/dL (ref >39)
LDL Chol Calc (NIH): 110 mg/dL — ABNORMAL HIGH (ref 0–99)
Triglycerides: 54 mg/dL (ref 0–149)
VLDL Cholesterol Cal: 11 mg/dL (ref 5–40)

## 2021-07-04 LAB — HEPATITIS C ANTIBODY: Hep C Virus Ab: NONREACTIVE

## 2021-07-08 ENCOUNTER — Encounter: Payer: Self-pay | Admitting: Obstetrics and Gynecology

## 2021-07-09 ENCOUNTER — Other Ambulatory Visit: Payer: Self-pay

## 2021-07-09 MED ORDER — CLOMIPHENE CITRATE 50 MG PO TABS
100.0000 mg | ORAL_TABLET | Freq: Every day | ORAL | 2 refills | Status: AC
  Filled 2021-07-09: qty 10, 5d supply, fill #0
  Filled 2021-08-26: qty 10, 5d supply, fill #1

## 2021-08-02 ENCOUNTER — Encounter: Payer: Self-pay | Admitting: Internal Medicine

## 2021-08-05 ENCOUNTER — Encounter: Payer: Self-pay | Admitting: Obstetrics and Gynecology

## 2021-08-20 ENCOUNTER — Encounter: Payer: Self-pay | Admitting: Obstetrics and Gynecology

## 2021-08-25 ENCOUNTER — Encounter: Payer: Self-pay | Admitting: Obstetrics and Gynecology

## 2021-08-25 DIAGNOSIS — Z319 Encounter for procreative management, unspecified: Secondary | ICD-10-CM

## 2021-08-26 ENCOUNTER — Other Ambulatory Visit: Payer: Self-pay

## 2021-08-27 ENCOUNTER — Other Ambulatory Visit: Payer: Self-pay

## 2021-09-05 ENCOUNTER — Other Ambulatory Visit: Payer: Self-pay

## 2021-09-05 ENCOUNTER — Other Ambulatory Visit: Payer: Self-pay | Admitting: Obstetrics and Gynecology

## 2021-09-05 MED ORDER — LETROZOLE 2.5 MG PO TABS
5.0000 mg | ORAL_TABLET | Freq: Every day | ORAL | 0 refills | Status: DC
Start: 2021-09-05 — End: 2021-11-05
  Filled 2021-09-05: qty 20, 10d supply, fill #0

## 2021-09-09 NOTE — Telephone Encounter (Signed)
Can we find out what is going on with this patient's referral to Endocrinology ([facility]). I believe it was sent to [facility].

## 2021-10-17 ENCOUNTER — Encounter: Payer: Self-pay | Admitting: Obstetrics and Gynecology

## 2021-11-05 ENCOUNTER — Other Ambulatory Visit: Payer: Self-pay | Admitting: Obstetrics and Gynecology

## 2021-11-05 ENCOUNTER — Other Ambulatory Visit: Payer: Self-pay

## 2021-11-05 MED FILL — Letrozole Tab 2.5 MG: ORAL | 90 days supply | Qty: 45 | Fill #0 | Status: AC

## 2021-11-06 ENCOUNTER — Other Ambulatory Visit: Payer: Self-pay

## 2021-11-06 DIAGNOSIS — H5211 Myopia, right eye: Secondary | ICD-10-CM | POA: Diagnosis not present

## 2021-11-08 ENCOUNTER — Other Ambulatory Visit: Payer: Self-pay

## 2021-11-25 ENCOUNTER — Encounter: Payer: Self-pay | Admitting: Obstetrics and Gynecology

## 2021-11-25 DIAGNOSIS — Z319 Encounter for procreative management, unspecified: Secondary | ICD-10-CM

## 2021-12-05 ENCOUNTER — Encounter: Payer: Self-pay | Admitting: Obstetrics and Gynecology

## 2022-01-06 ENCOUNTER — Other Ambulatory Visit: Payer: Self-pay

## 2022-01-06 DIAGNOSIS — R635 Abnormal weight gain: Secondary | ICD-10-CM | POA: Diagnosis not present

## 2022-01-06 DIAGNOSIS — R5383 Other fatigue: Secondary | ICD-10-CM | POA: Diagnosis not present

## 2022-01-06 DIAGNOSIS — Z319 Encounter for procreative management, unspecified: Secondary | ICD-10-CM | POA: Diagnosis not present

## 2022-01-06 DIAGNOSIS — L659 Nonscarring hair loss, unspecified: Secondary | ICD-10-CM | POA: Diagnosis not present

## 2022-01-06 MED ORDER — DEXAMETHASONE 1 MG PO TABS
ORAL_TABLET | ORAL | 0 refills | Status: DC
  Filled 2022-01-06: qty 1, 1d supply, fill #0

## 2022-01-10 DIAGNOSIS — R5383 Other fatigue: Secondary | ICD-10-CM | POA: Diagnosis not present

## 2022-01-10 DIAGNOSIS — Z319 Encounter for procreative management, unspecified: Secondary | ICD-10-CM | POA: Diagnosis not present

## 2022-01-10 DIAGNOSIS — L659 Nonscarring hair loss, unspecified: Secondary | ICD-10-CM | POA: Diagnosis not present

## 2022-01-10 DIAGNOSIS — R635 Abnormal weight gain: Secondary | ICD-10-CM | POA: Diagnosis not present

## 2022-01-22 ENCOUNTER — Other Ambulatory Visit: Payer: Self-pay

## 2022-01-23 ENCOUNTER — Other Ambulatory Visit: Payer: Self-pay

## 2022-01-23 MED ORDER — METFORMIN HCL ER 500 MG PO TB24
ORAL_TABLET | ORAL | 3 refills | Status: DC
  Filled 2022-01-23: qty 60, 30d supply, fill #0
  Filled 2022-02-20: qty 60, 30d supply, fill #1
  Filled 2022-03-19: qty 60, 30d supply, fill #2
  Filled 2022-04-18: qty 60, 30d supply, fill #3

## 2022-02-06 ENCOUNTER — Encounter: Payer: Self-pay | Admitting: Obstetrics and Gynecology

## 2022-02-06 DIAGNOSIS — Z319 Encounter for procreative management, unspecified: Secondary | ICD-10-CM

## 2022-02-06 DIAGNOSIS — L659 Nonscarring hair loss, unspecified: Secondary | ICD-10-CM

## 2022-02-20 ENCOUNTER — Other Ambulatory Visit: Payer: Self-pay

## 2022-03-19 ENCOUNTER — Other Ambulatory Visit: Payer: Self-pay

## 2022-04-18 NOTE — Telephone Encounter (Signed)
Pt called triage and said that she got an appt with the fertility clinic for 11/02/22, that's the earliest she could get. Wants to know if she can get an HSG procedure since the appt is too far out? I am out of the office next week, if you can pls reply to pt via mychart with your answer or send msg to [facility]Clinical pool?

## 2022-04-23 NOTE — Addendum Note (Signed)
Addended by: Fabian November on: 04/23/2022 09:34 PM   Modules accepted: Orders

## 2022-04-24 ENCOUNTER — Telehealth: Payer: Self-pay

## 2022-04-24 NOTE — Telephone Encounter (Signed)
Called and left VM asking patient to call the office back to reschedule her physical from March 11th 2024 to another date. Dr Judithann Graves will be out of the office that week.  Susan Hoffman

## 2022-05-02 ENCOUNTER — Ambulatory Visit
Admission: RE | Admit: 2022-05-02 | Discharge: 2022-05-02 | Disposition: A | Discharge status: Home / Self Care | Payer: Commercial Managed Care - PPO | Source: Ambulatory Visit | Attending: Obstetrics and Gynecology | Admitting: Obstetrics and Gynecology

## 2022-05-02 DIAGNOSIS — N879 Dysplasia of cervix uteri, unspecified: Secondary | ICD-10-CM

## 2022-05-02 DIAGNOSIS — N979 Female infertility, unspecified: Secondary | ICD-10-CM | POA: Diagnosis not present

## 2022-05-02 DIAGNOSIS — Z319 Encounter for procreative management, unspecified: Secondary | ICD-10-CM | POA: Diagnosis not present

## 2022-05-02 MED ORDER — IOHEXOL 300 MG/ML  SOLN
30.0000 mL | Freq: Once | INTRAMUSCULAR | Status: AC | PRN
  Administered 2022-05-02: 20 mL

## 2022-05-06 ENCOUNTER — Encounter: Payer: Self-pay | Admitting: Physician Assistant

## 2022-05-06 ENCOUNTER — Telehealth: Payer: Self-pay

## 2022-05-06 ENCOUNTER — Telehealth: Payer: Commercial Managed Care - PPO | Admitting: Physician Assistant

## 2022-05-06 ENCOUNTER — Other Ambulatory Visit: Payer: Self-pay

## 2022-05-06 DIAGNOSIS — R1030 Lower abdominal pain, unspecified: Secondary | ICD-10-CM | POA: Diagnosis not present

## 2022-05-06 MED ORDER — ONDANSETRON HCL 4 MG PO TABS
4.0000 mg | ORAL_TABLET | Freq: Three times a day (TID) | ORAL | 0 refills | Status: DC | PRN
  Filled 2022-05-06: qty 20, 7d supply, fill #0

## 2022-05-06 MED ORDER — DOXYCYCLINE HYCLATE 100 MG PO TABS
100.0000 mg | ORAL_TABLET | Freq: Two times a day (BID) | ORAL | 0 refills | Status: AC
  Filled 2022-05-06: qty 14, 7d supply, fill #0

## 2022-05-06 MED ORDER — IBUPROFEN 800 MG PO TABS
800.0000 mg | ORAL_TABLET | Freq: Three times a day (TID) | ORAL | 0 refills | Status: DC | PRN
  Filled 2022-05-06: qty 30, 10d supply, fill #0

## 2022-05-06 NOTE — Telephone Encounter (Signed)
I called patient and advised her that she is probably having some contrast leakage and she may have some sensitivity to it. Try using some coconut oil and take some Ibuprofen and if that did not resolve her pain she should come in to be seen. She insisted that she be seen because the pain was severe. Where do you want her at on the schedule.

## 2022-05-06 NOTE — Progress Notes (Signed)
Virtual Visit Consent   [name], you are scheduled for a virtual visit with a [facility] provider today. Just as with appointments in the office, your consent must be obtained to participate. Your consent will be active for this visit and any virtual visit you may have with one of our providers in the next 365 days. If you have a MyChart account, a copy of this consent can be sent to you electronically.  As this is a virtual visit, video technology does not allow for your provider to perform a traditional examination. This may limit your provider's ability to fully assess your condition. If your provider identifies any concerns that need to be evaluated in person or the need to arrange testing (such as labs, EKG, etc.), we will make arrangements to do so. Although advances in technology are sophisticated, we cannot ensure that it will always work on either your end or our end. If the connection with a video visit is poor, the visit may have to be switched to a telephone visit. With either a video or telephone visit, we are not always able to ensure that we have a secure connection.  By engaging in this virtual visit, you consent to the provision of healthcare and authorize for your insurance to be billed (if applicable) for the services provided during this visit. Depending on your insurance coverage, you may receive a charge related to this service.  I need to obtain your verbal consent now. Are you willing to proceed with your visit today? Susan Hoffman has provided verbal consent on 05/06/2022 for a virtual visit (video or telephone). Susan Arena Ward, PA-C  Date: 05/06/2022 4:28 PM  Virtual Visit via Video Note   I, Susan Hoffman, connected with  Susan Hoffman  ([id number], Jul 03, 1990) on 05/06/22 at  4:15 PM EST by a video-enabled telemedicine application and verified that I am speaking with the correct person using two identifiers.  Location: Patient: Virtual Visit  Location Patient: Home Provider: Virtual Visit Location Provider: Home Office   I discussed the limitations of evaluation and management by telemedicine and the availability of in person appointments. The patient expressed understanding and agreed to proceed.    History of Present Illness: Susan Hoffman is a 32 y.o. who identifies as a female who was assigned female at birth, and is being seen today for pain following HSG procedure on 05/03/23.  She reports she has been feeling clammy.  Following intercourse yesterday was experiencing extreme pain. She has been taking Ibuprofen as needed and using luke warm baking soda rinse as advised by on call provider last night with no improvement.  She reports pain is more tolerable today. She reports the pain is associated with abdomen, uterine cramping.  Reports nausea and vomiting.  She has been keeping fluids down, decreased appetite.  Denies fever. She reports she is waiting to hear back from the provider who performed the procedure.   HPI: HPI  Problems:  Patient Active Problem List   Diagnosis Date Noted   Ankle pain, right 10/11/2019   Abnormal Pap smear of cervix 02/13/2017   Attention deficit hyperactivity disorder (ADHD), predominantly hyperactive type 02/13/2017   Nummular dermatitis 02/13/2017   Deafness in right ear 02/13/2017    Allergies:  Allergies  Allergen Reactions   Covid-19 (Mrna) Vaccine Anaphylaxis, Other (See Comments) and Shortness Of Breath   Cefdinir Diarrhea   Codeine    Mushroom Extract Complex    Medications:  Current Outpatient  Medications:    ASHWAGANDHA PO, Take by mouth., Disp: , Rfl:    dexamethasone (DECADRON) 1 MG tablet, Take at midnight, the night before your blood test, Disp: 1 tablet, Rfl: 0   letrozole (FEMARA) 2.5 MG tablet, Take 3 tablets (7.5 mg total) by mouth daily. Take on Days 1-5 of your cycle., Disp: 45 tablet, Rfl: 0   medroxyPROGESTERone (PROVERA) 10 MG tablet, Take 1 tablet (10 mg total)  by mouth daily. Use for ten days, Disp: 10 tablet, Rfl: 2   metFORMIN (GLUCOPHAGE-XR) 500 MG 24 hr tablet, Take 1 pill daily for 1 week. Then increase to 2 pills daily, Disp: 60 tablet, Rfl: 3   Prenatal Vit-Fe Fumarate-FA (PRENATAL MULTIVITAMIN) TABS tablet, Take 1 tablet by mouth daily at 12 noon., Disp: , Rfl:   Observations/Objective: Patient is well-developed, well-nourished in no acute distress.  Resting comfortably at home.  Head is normocephalic, atraumatic.  No labored breathing.  Speech is clear and coherent with logical content.  Patient is alert and oriented at baseline.    Assessment and Plan: 1. Lower abdominal pain  Following a recent procedure.  Discussed with Dr. Valentino Saxon. Will send in doxycycline, zofran, and ibuprofen.  If no improvement in 1-2 days pt will be scheduled for a visit in person with Dr. Valentino Saxon.    Follow Up Instructions: I discussed the assessment and treatment plan with the patient. The patient was provided an opportunity to ask questions and all were answered. The patient agreed with the plan and demonstrated an understanding of the instructions.  A copy of instructions were sent to the patient via MyChart unless otherwise noted below.     The patient was advised to call back or seek an in-person evaluation if the symptoms worsen or if the condition fails to improve as anticipated.  Time:  I spent 24 minutes with the patient via telehealth technology discussing the above problems/concerns.    [name], PA-C

## 2022-05-06 NOTE — Patient Instructions (Signed)
Jory Sims, thank you for joining [place], PA-C for today's virtual visit.  While this provider is not your primary care provider (PCP), if your PCP is located in our provider database this encounter information will be shared with them immediately following your visit.   [facility] account gives you access to today's visit and all your visits, tests, and labs performed at [facility] " click here if you don't have a [facility] account or go to [email or web address]  Consent: (Patient) Susan Hoffman provided verbal consent for this virtual visit at the beginning of the encounter.  Current Medications:  Current Outpatient Medications:    doxycycline (VIBRA-TABS) 100 MG tablet, Take 1 tablet (100 mg total) by mouth 2 (two) times daily for 7 days., Disp: 14 tablet, Rfl: 0   ibuprofen (ADVIL) 800 MG tablet, Take 1 tablet (800 mg total) by mouth every 8 (eight) hours as needed., Disp: 30 tablet, Rfl: 0   ondansetron (ZOFRAN) 4 MG tablet, Take 1 tablet (4 mg total) by mouth every 8 (eight) hours as needed for nausea or vomiting., Disp: 20 tablet, Rfl: 0   ASHWAGANDHA PO, Take by mouth., Disp: , Rfl:    dexamethasone (DECADRON) 1 MG tablet, Take at midnight, the night before your blood test, Disp: 1 tablet, Rfl: 0   letrozole (FEMARA) 2.5 MG tablet, Take 3 tablets (7.5 mg total) by mouth daily. Take on Days 1-5 of your cycle., Disp: 45 tablet, Rfl: 0   medroxyPROGESTERone (PROVERA) 10 MG tablet, Take 1 tablet (10 mg total) by mouth daily. Use for ten days, Disp: 10 tablet, Rfl: 2   metFORMIN (GLUCOPHAGE-XR) 500 MG 24 hr tablet, Take 1 pill daily for 1 week. Then increase to 2 pills daily, Disp: 60 tablet, Rfl: 3   Prenatal Vit-Fe Fumarate-FA (PRENATAL MULTIVITAMIN) TABS tablet, Take 1 tablet by mouth daily at 12 noon., Disp: , Rfl:    Medications ordered in this encounter:  Meds ordered this encounter  Medications   doxycycline  (VIBRA-TABS) 100 MG tablet    Sig: Take 1 tablet (100 mg total) by mouth 2 (two) times daily for 7 days.    Dispense:  14 tablet    Refill:  0    Order Specific Question:   Supervising Provider    Answer:   LAMPTEY, PHILIP O [[id number]]   ondansetron (ZOFRAN) 4 MG tablet    Sig: Take 1 tablet (4 mg total) by mouth every 8 (eight) hours as needed for nausea or vomiting.    Dispense:  20 tablet    Refill:  0    Order Specific Question:   Supervising Provider    Answer:   Chase Picket [id number]   ibuprofen (ADVIL) 800 MG tablet    Sig: Take 1 tablet (800 mg total) by mouth every 8 (eight) hours as needed.    Dispense:  30 tablet    Refill:  0    Order Specific Question:   Supervising Provider    Answer:   Chase Picket [id number]     *If you need refills on other medications prior to your next appointment, please contact your pharmacy*  Follow-Up: Call back or seek an in-person evaluation if the symptoms worsen or if the condition fails to improve as anticipated.  [place] [phone]  Other Instructions I spoke with Dr. Marcelline Mates who recommend Doxycycline for possible infection.  I have also sent in Zofran to take as needed for  nausea.  800mg  Ibuprofen sent to pharmacy to take as needed.  Dr. recommends an in person visit with her in 1-2 days if no improvement.  She also recommends you to take a pregnancy test.  She reports it is normal to experience cramping and discomfort for up to 7 days after the procedure.    If you have been instructed to have an in-person evaluation today at a local Urgent Care facility, please use the link below. It will take you to a list of all of our available [facility], including address, phone number and hours of operation. Please do not delay care.  [facility]  If you or a family member do not have a primary care provider, use the link below to schedule a visit and establish care. When you choose  a [facility] primary care physician or advanced practice provider, you gain a long-term partner in health. Find a Primary Care Provider  Learn more about [facility]'s in-office and virtual care options:  - Get Care Now

## 2022-05-06 NOTE — Telephone Encounter (Addendum)
Pt called after hour nurse 05/05/22 8:08pm c/o vaginal pain after IC; pt had a procedure Friday and needs advice on how she should manage her pain.  After hour nurse adv pt to be seen within two weeks and to use lubricant.  Pt also called the triage line stating the nurse she talked to said she might have an infection.  Is calling to be seen. [phone]

## 2022-05-07 NOTE — Telephone Encounter (Signed)
Patient evaluated with telehealth yesterday. If patient not feeling better by tomorrow, can be placed on provider schedule for follow up.

## 2022-05-15 ENCOUNTER — Other Ambulatory Visit: Payer: Self-pay

## 2022-05-16 ENCOUNTER — Other Ambulatory Visit: Payer: Self-pay

## 2022-05-16 MED ORDER — METFORMIN HCL ER 500 MG PO TB24
ORAL_TABLET | ORAL | 0 refills | Status: DC
  Filled 2022-05-16: qty 21, 14d supply, fill #0

## 2022-05-29 ENCOUNTER — Encounter: Payer: Self-pay | Admitting: Internal Medicine

## 2022-06-09 ENCOUNTER — Other Ambulatory Visit: Payer: Self-pay

## 2022-06-10 ENCOUNTER — Other Ambulatory Visit: Payer: Self-pay

## 2022-06-10 MED ORDER — METFORMIN HCL ER 500 MG PO TB24
ORAL_TABLET | ORAL | 0 refills | Status: DC
  Filled 2022-06-10: qty 180, 90d supply, fill #0

## 2022-06-10 MED ORDER — METFORMIN HCL ER 500 MG PO TB24
1000.0000 mg | ORAL_TABLET | Freq: Every day | ORAL | 1 refills | Status: DC
  Filled 2022-06-10 – 2022-11-09 (×2): qty 180, 90d supply, fill #0

## 2022-07-07 ENCOUNTER — Encounter: Payer: 59 | Admitting: Internal Medicine

## 2022-07-18 ENCOUNTER — Encounter: Payer: 59 | Admitting: Internal Medicine

## 2022-08-08 ENCOUNTER — Other Ambulatory Visit: Payer: Self-pay

## 2022-08-08 ENCOUNTER — Ambulatory Visit (INDEPENDENT_AMBULATORY_CARE_PROVIDER_SITE_OTHER): Payer: Commercial Managed Care - PPO | Admitting: Internal Medicine

## 2022-08-08 ENCOUNTER — Encounter: Payer: Self-pay | Admitting: Internal Medicine

## 2022-08-08 VITALS — BP 122/78 | HR 64 | Ht 62.0 in | Wt 151.0 lb

## 2022-08-08 DIAGNOSIS — Z3009 Encounter for other general counseling and advice on contraception: Secondary | ICD-10-CM

## 2022-08-08 DIAGNOSIS — R0789 Other chest pain: Secondary | ICD-10-CM

## 2022-08-08 DIAGNOSIS — J45990 Exercise induced bronchospasm: Secondary | ICD-10-CM

## 2022-08-08 DIAGNOSIS — Z0000 Encounter for general adult medical examination without abnormal findings: Secondary | ICD-10-CM

## 2022-08-08 MED ORDER — ALBUTEROL SULFATE HFA 108 (90 BASE) MCG/ACT IN AERS
2.0000 | INHALATION_SPRAY | Freq: Four times a day (QID) | RESPIRATORY_TRACT | 1 refills | Status: DC | PRN
  Filled 2022-08-08: qty 6.7, 25d supply, fill #0

## 2022-08-08 NOTE — Progress Notes (Signed)
Date:  08/08/2022   Name:  Susan Hoffman   DOB:  July 02, 1990   MRN:  [id number]   Chief Complaint: Annual Exam (Breast exam no pap) Susan Hoffman is a 32 y.o. female who presents today for her Complete Annual Exam. She feels well. She reports exercising 6 days a week, soccer and weight lifting. She reports she is sleeping well. Breast complaints none.  She is having a fertility visit next month but is now a week late.   Mammogram: none DEXA: none Pap smear: 06/2020 neg/neg Colonoscopy: none  Health Maintenance Due  Topic Date Due   HIV Screening  Never done    Immunization History  Administered Date(s) Administered   Hepatitis A 10/18/2009   Hepatitis B 03/05/1997, 05/15/1997, 09/22/1997   Hepatitis B, ADULT 03/31/2014   Influenza-Unspecified 02/15/2019, 01/27/2020, 02/26/2021   MMR 07/11/1992, 09/02/1996   Meningococcal Conjugate 10/18/2009   PFIZER(Purple Top)SARS-COV-2 Vaccination 04/29/2019, 05/19/2019, 03/09/2020   PPD Test 09/01/2013   Tdap 10/18/2009, 06/23/2018    Chest Pain  This is a recurrent problem. The current episode started 1 to 4 weeks ago. The onset quality is sudden. The problem occurs intermittently. The problem has been unchanged. The pain is present in the lateral region. The pain is moderate. The quality of the pain is described as squeezing (lasts only a few seconds). The pain does not radiate. Pertinent negatives include no abdominal pain, cough, dizziness, fever, headaches, palpitations, shortness of breath or vomiting.  She exercises regularly, play soccer, lifts weights and runs - all without chest pain, shortness of breath, decrease in stamina, etc.  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   Lab Results  Component Value Date   NA 140 07/03/2021   K 4.4 07/03/2021   CO2 22 07/03/2021   GLUCOSE 93 07/03/2021   BUN 14 07/03/2021   CREATININE 0.85 07/03/2021   CALCIUM 9.3 07/03/2021   EGFR 94  07/03/2021   GFRNONAA 79 06/27/2019   Lab Results  Component Value Date   CHOL 176 07/03/2021   HDL 55 07/03/2021   LDLCALC 110 (H) 07/03/2021   TRIG 54 07/03/2021   CHOLHDL 3.2 07/03/2021   Lab Results  Component Value Date   TSH 1.000 07/03/2021   No results found for: "HGBA1C" Lab Results  Component Value Date   WBC 6.3 07/03/2021   HGB 13.5 07/03/2021   HCT 40.8 07/03/2021   MCV 92 07/03/2021   PLT 318 07/03/2021   Lab Results  Component Value Date   ALT 14 07/03/2021   AST 19 07/03/2021   ALKPHOS 58 07/03/2021   BILITOT 0.6 07/03/2021   No results found for: "25OHVITD2", "25OHVITD3", "VD25OH"   Review of Systems  Constitutional:  Negative for chills, fatigue and fever.  HENT:  Negative for congestion, hearing loss, tinnitus, trouble swallowing and voice change.   Eyes:  Negative for visual disturbance.  Respiratory:  Negative for cough, chest tightness, shortness of breath and wheezing.   Cardiovascular:  Positive for chest pain. Negative for palpitations and leg swelling.  Gastrointestinal:  Negative for abdominal pain, constipation, diarrhea and vomiting.  Endocrine: Negative for polydipsia and polyuria.  Genitourinary:  Negative for dysuria, frequency, genital sores, vaginal bleeding and vaginal discharge.  Musculoskeletal:  Negative for arthralgias, gait problem and joint swelling.  Skin:  Negative for color change and rash.  Neurological:  Negative for dizziness, tremors, light-headedness and headaches.  Hematological:  Negative for adenopathy. Does not bruise/bleed easily.  Psychiatric/Behavioral:  Negative for dysphoric mood and sleep disturbance. The patient is not nervous/anxious.     Patient Active Problem List   Diagnosis Date Noted   Ankle pain, right 10/11/2019   Abnormal Pap smear of cervix 02/13/2017   Attention deficit hyperactivity disorder (ADHD), predominantly hyperactive type 02/13/2017   Nummular dermatitis 02/13/2017   Deafness in  right ear 02/13/2017    Allergies  Allergen Reactions   Covid-19 (Mrna) Vaccine Anaphylaxis, Other (See Comments) and Shortness Of Breath   Cefdinir Diarrhea   Codeine    Mushroom Extract Complex     Past Surgical History:  Procedure Laterality Date   nexplanon  2017   TYMPANOPLASTY     wisdom teeth removal      Social History   Tobacco Use   Smoking status: Never   Smokeless tobacco: Never  Vaping Use   Vaping Use: Never used  Substance Use Topics   Alcohol use: Not Currently    Alcohol/week: 0.0 - 1.0 standard drinks of alcohol    Comment: 1 glass wine every 2 weeks   Drug use: No     Medication list has been reviewed and updated.  Current Meds  Medication Sig   albuterol (VENTOLIN HFA) 108 (90 Base) MCG/ACT inhaler Inhale 2 puffs into the lungs every 6 (six) hours as needed for wheezing or shortness of breath.   letrozole (FEMARA) 2.5 MG tablet Take by mouth.   metFORMIN (GLUCOPHAGE-XR) 500 MG 24 hr tablet Take 2 tablets (1,000 mg total) daily.   Prenatal Vit-Fe Fumarate-FA (PRENATAL MULTIVITAMIN) TABS tablet Take 1 tablet by mouth daily at 12 noon.   [DISCONTINUED] ASHWAGANDHA PO Take by mouth.       08/08/2022    8:14 AM 07/03/2021    8:09 AM 06/28/2020    7:51 AM 06/12/2020    4:03 PM  GAD 7 : Generalized Anxiety Score  Nervous, Anxious, on Edge 0 0 0 0  Control/stop worrying 0 0 0 0  Worry too much - different things 0 0 0 0  Trouble relaxing 0 0 0 0  Restless 0 0 0 0  Easily annoyed or irritable 0 0 0 0  Afraid - awful might happen 0 0 0 0  Total GAD 7 Score 0 0 0 0  Anxiety Difficulty Not difficult at all Not difficult at all  Not difficult at all       08/08/2022    8:14 AM 07/03/2021    8:09 AM 06/28/2020    7:51 AM  Depression screen PHQ 2/9  Decreased Interest 0 0 0  Down, Depressed, Hopeless 0 0 0  PHQ - 2 Score 0 0 0  Altered sleeping 0 0 0  Tired, decreased energy 2 2 0  Change in appetite 0 0 0  Feeling bad or failure about yourself  0 0  0  Trouble concentrating 0 0 0  Moving slowly or fidgety/restless 0 0 0  Suicidal thoughts 0 0 0  PHQ-9 Score 2 2 0  Difficult doing work/chores Not difficult at all Not difficult at all     BP Readings from Last 3 Encounters:  08/08/22 122/78  07/03/21 122/78  03/15/21 (!) 116/52    Physical Exam Vitals and nursing note reviewed.  Constitutional:      General: She is not in acute distress.    Appearance: She is well-developed.  HENT:     Head: Normocephalic and atraumatic.     Right Ear: Tympanic membrane and ear canal  normal.     Left Ear: Tympanic membrane and ear canal normal.     Nose:     Right Sinus: No maxillary sinus tenderness.     Left Sinus: No maxillary sinus tenderness.  Eyes:     General: No scleral icterus.       Right eye: No discharge.        Left eye: No discharge.     Conjunctiva/sclera: Conjunctivae normal.  Neck:     Thyroid: No thyromegaly.     Vascular: No carotid bruit.  Cardiovascular:     Rate and Rhythm: Normal rate and regular rhythm.     Pulses: Normal pulses.     Heart sounds: Normal heart sounds.  Pulmonary:     Effort: Pulmonary effort is normal. No respiratory distress.     Breath sounds: No wheezing.  Chest:  Breasts:    Right: No mass, nipple discharge, skin change or tenderness.     Left: No mass, nipple discharge, skin change or tenderness.  Abdominal:     General: Bowel sounds are normal.     Palpations: Abdomen is soft.     Tenderness: There is no abdominal tenderness.  Musculoskeletal:     Cervical back: Normal range of motion. No erythema.     Right lower leg: No edema.     Left lower leg: No edema.  Lymphadenopathy:     Cervical: No cervical adenopathy.  Skin:    General: Skin is warm and dry.     Capillary Refill: Capillary refill takes less than 2 seconds.     Findings: No rash.  Neurological:     General: No focal deficit present.     Mental Status: She is alert and oriented to person, place, and time.      Cranial Nerves: No cranial nerve deficit.     Sensory: No sensory deficit.     Deep Tendon Reflexes: Reflexes are normal and symmetric.  Psychiatric:        Attention and Perception: Attention normal.        Mood and Affect: Mood normal.     Wt Readings from Last 3 Encounters:  08/08/22 151 lb (68.5 kg)  07/03/21 162 lb 3.2 oz (73.6 kg)  03/29/21 160 lb (72.6 kg)    BP 122/78   Pulse 64   Ht 5\' 2"  (1.575 m)   Wt 151 lb (68.5 kg)   LMP 07/08/2022 (Exact Date)   SpO2 99%   BMI 27.62 kg/m   Assessment and Plan:  Problem List Items Addressed This Visit   None Visit Diagnoses     Annual physical exam    -  Primary   Relevant Orders   CBC with Differential/Platelet   Comprehensive metabolic panel   Hemoglobin A1c   Lipid panel   TSH   Exercise induced bronchospasm       Relevant Medications   albuterol (VENTOLIN HFA) 108 (90 Base) MCG/ACT inhaler   Atypical chest pain       exercising and lifting weights without pain No associated symptoms EKG - normal and reassuring SB 54 with variable rate   Relevant Orders   EKG 12-Lead (Completed)   Family planning       recommend home pregnancy testing on first AM urine follow up with [facility] as scheduled       No follow-ups on file.   Partially dictated using Dragon software, any errors are not intentional.  Reubin Milan, MD [facility]  [facility] and [place], [place]

## 2022-08-09 LAB — CBC WITH DIFFERENTIAL/PLATELET
Basophils Absolute: 0 10*3/uL (ref 0.0–0.2)
Basos: 0 %
EOS (ABSOLUTE): 0.2 10*3/uL (ref 0.0–0.4)
Eos: 2 %
Hematocrit: 43.3 % (ref 34.0–46.6)
Hemoglobin: 14.4 g/dL (ref 11.1–15.9)
Immature Grans (Abs): 0 10*3/uL (ref 0.0–0.1)
Immature Granulocytes: 0 %
Lymphocytes Absolute: 2.2 10*3/uL (ref 0.7–3.1)
Lymphs: 31 %
MCH: 31.4 pg (ref 26.6–33.0)
MCHC: 33.3 g/dL (ref 31.5–35.7)
MCV: 94 fL (ref 79–97)
Monocytes Absolute: 0.4 10*3/uL (ref 0.1–0.9)
Monocytes: 5 %
Neutrophils Absolute: 4.3 10*3/uL (ref 1.4–7.0)
Neutrophils: 62 %
Platelets: 321 10*3/uL (ref 150–450)
RBC: 4.59 x10E6/uL (ref 3.77–5.28)
RDW: 11.8 % (ref 11.7–15.4)
WBC: 7 10*3/uL (ref 3.4–10.8)

## 2022-08-09 LAB — COMPREHENSIVE METABOLIC PANEL
ALT: 11 IU/L (ref 0–32)
AST: 15 IU/L (ref 0–40)
Albumin/Globulin Ratio: 1.9 (ref 1.2–2.2)
Albumin: 4.5 g/dL (ref 3.9–4.9)
Alkaline Phosphatase: 73 IU/L (ref 44–121)
BUN/Creatinine Ratio: 10 (ref 9–23)
BUN: 9 mg/dL (ref 6–20)
Bilirubin Total: 0.7 mg/dL (ref 0.0–1.2)
CO2: 23 mmol/L (ref 20–29)
Calcium: 9.8 mg/dL (ref 8.7–10.2)
Chloride: 104 mmol/L (ref 96–106)
Creatinine, Ser: 0.9 mg/dL (ref 0.57–1.00)
Globulin, Total: 2.4 g/dL (ref 1.5–4.5)
Glucose: 86 mg/dL (ref 70–99)
Potassium: 5.2 mmol/L (ref 3.5–5.2)
Sodium: 140 mmol/L (ref 134–144)
Total Protein: 6.9 g/dL (ref 6.0–8.5)
eGFR: 88 mL/min/{1.73_m2} (ref >59)

## 2022-08-09 LAB — LIPID PANEL
Chol/HDL Ratio: 2.7 ratio (ref 0.0–4.4)
Cholesterol, Total: 173 mg/dL (ref 100–199)
HDL: 64 mg/dL (ref >39)
LDL Chol Calc (NIH): 96 mg/dL (ref 0–99)
Triglycerides: 65 mg/dL (ref 0–149)
VLDL Cholesterol Cal: 13 mg/dL (ref 5–40)

## 2022-08-09 LAB — HEMOGLOBIN A1C
Est. average glucose Bld gHb Est-mCnc: 100 mg/dL
Hgb A1c MFr Bld: 5.1 % (ref 4.8–5.6)

## 2022-08-09 LAB — TSH: TSH: 0.891 u[IU]/mL (ref 0.450–4.500)

## 2022-08-11 ENCOUNTER — Other Ambulatory Visit: Payer: Self-pay

## 2022-08-11 DIAGNOSIS — R635 Abnormal weight gain: Secondary | ICD-10-CM | POA: Diagnosis not present

## 2022-08-11 DIAGNOSIS — R5383 Other fatigue: Secondary | ICD-10-CM | POA: Diagnosis not present

## 2022-08-11 MED ORDER — METFORMIN HCL ER 500 MG PO TB24
2000.0000 mg | ORAL_TABLET | Freq: Every day | ORAL | 2 refills | Status: DC
  Filled 2023-01-14: qty 360, fill #0
  Filled 2023-01-14: qty 360, 90d supply, fill #0

## 2022-08-12 ENCOUNTER — Other Ambulatory Visit: Payer: Self-pay

## 2022-08-12 MED ORDER — METFORMIN HCL ER 500 MG PO TB24
2000.0000 mg | ORAL_TABLET | Freq: Every day | ORAL | 2 refills | Status: DC
  Filled 2022-08-12: qty 360, 90d supply, fill #0
  Filled 2022-11-09: qty 360, 90d supply, fill #1

## 2022-08-21 ENCOUNTER — Other Ambulatory Visit: Payer: Self-pay

## 2022-09-01 ENCOUNTER — Encounter: Payer: Self-pay | Admitting: Internal Medicine

## 2022-09-10 DIAGNOSIS — Z3169 Encounter for other general counseling and advice on procreation: Secondary | ICD-10-CM | POA: Diagnosis not present

## 2022-09-10 DIAGNOSIS — Z1379 Encounter for other screening for genetic and chromosomal anomalies: Secondary | ICD-10-CM | POA: Diagnosis not present

## 2022-09-10 DIAGNOSIS — N979 Female infertility, unspecified: Secondary | ICD-10-CM | POA: Diagnosis not present

## 2022-10-04 DIAGNOSIS — Z3169 Encounter for other general counseling and advice on procreation: Secondary | ICD-10-CM | POA: Diagnosis not present

## 2022-10-04 DIAGNOSIS — Z1379 Encounter for other screening for genetic and chromosomal anomalies: Secondary | ICD-10-CM | POA: Diagnosis not present

## 2022-10-04 DIAGNOSIS — N979 Female infertility, unspecified: Secondary | ICD-10-CM | POA: Diagnosis not present

## 2022-10-07 ENCOUNTER — Other Ambulatory Visit: Payer: Self-pay

## 2022-10-07 MED ORDER — LETROZOLE 2.5 MG PO TABS
5.0000 mg | ORAL_TABLET | Freq: Every day | ORAL | 1 refills | Status: DC
  Filled 2022-10-07: qty 10, 5d supply, fill #0
  Filled 2022-11-26: qty 10, 5d supply, fill #1

## 2022-11-10 ENCOUNTER — Other Ambulatory Visit: Payer: Self-pay

## 2022-11-11 DIAGNOSIS — N979 Female infertility, unspecified: Secondary | ICD-10-CM | POA: Diagnosis not present

## 2022-11-12 DIAGNOSIS — N979 Female infertility, unspecified: Secondary | ICD-10-CM | POA: Diagnosis not present

## 2022-11-26 ENCOUNTER — Other Ambulatory Visit: Payer: Self-pay

## 2022-12-04 DIAGNOSIS — N979 Female infertility, unspecified: Secondary | ICD-10-CM | POA: Diagnosis not present

## 2022-12-04 DIAGNOSIS — Z3141 Encounter for fertility testing: Secondary | ICD-10-CM | POA: Diagnosis not present

## 2022-12-05 DIAGNOSIS — N978 Female infertility of other origin: Secondary | ICD-10-CM | POA: Diagnosis not present

## 2022-12-05 DIAGNOSIS — Z3181 Encounter for male factor infertility in female patient: Secondary | ICD-10-CM | POA: Diagnosis not present

## 2022-12-05 DIAGNOSIS — N979 Female infertility, unspecified: Secondary | ICD-10-CM | POA: Diagnosis not present

## 2022-12-22 DIAGNOSIS — N912 Amenorrhea, unspecified: Secondary | ICD-10-CM | POA: Diagnosis not present

## 2023-01-04 IMAGING — US US PELVIS COMPLETE WITH TRANSVAGINAL
1 series · 14 of 25 positions shown · non-contrast
Comparison: None

CLINICAL DATA: In fertility counseling, G0, LMP 02/22/2021



[Series 1: us pelvic complete with transvaginal · 14 of 130 slices shown]
[im 1/130]
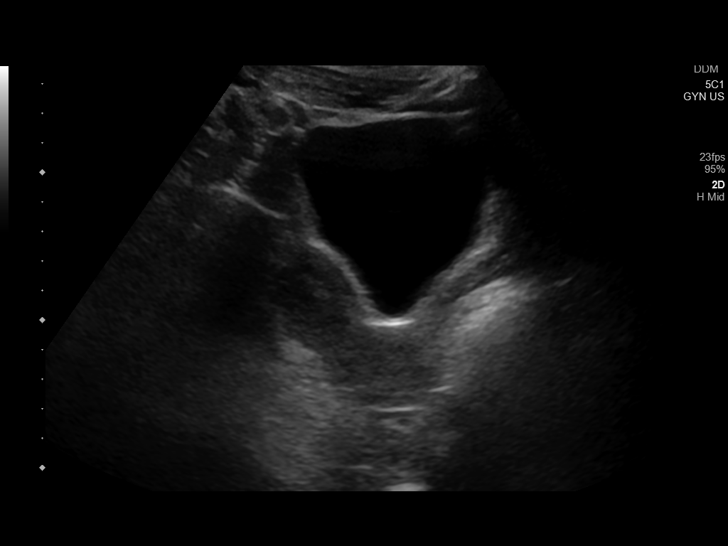
[im 11/130]
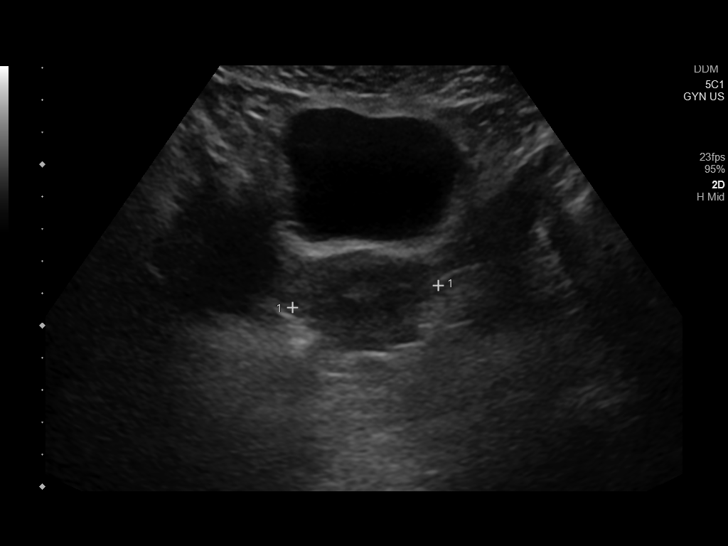
[im 22/130]
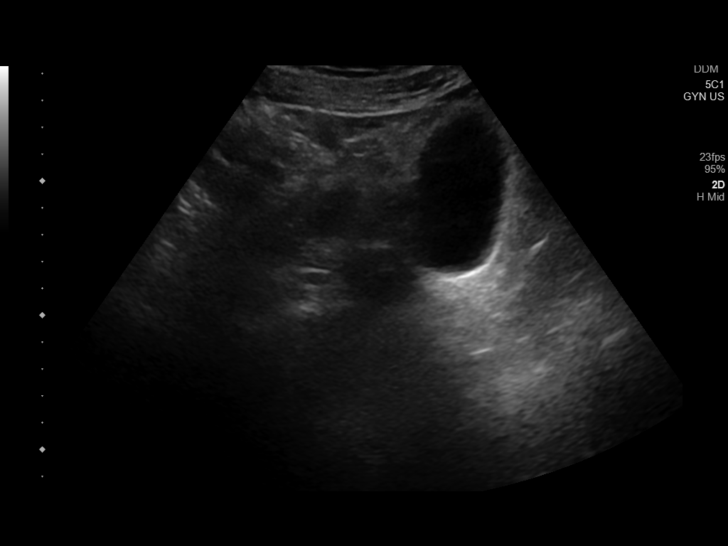
[im 33/130]
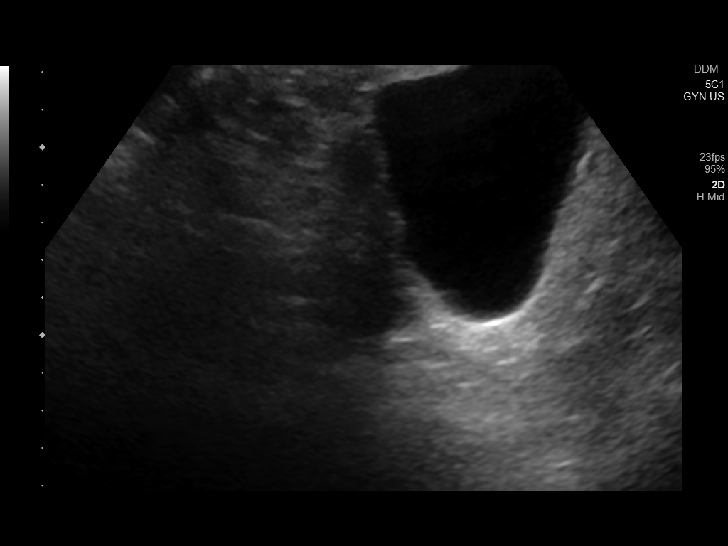
[im 44/130]
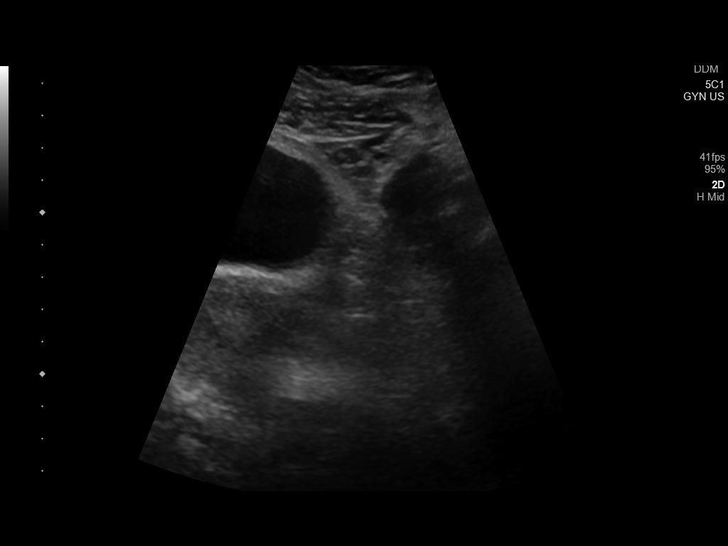
[im 49/130]
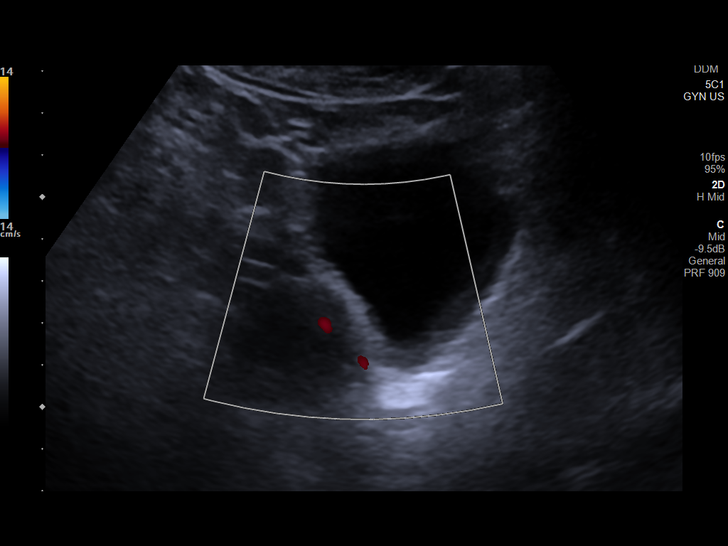
[im 60/130]
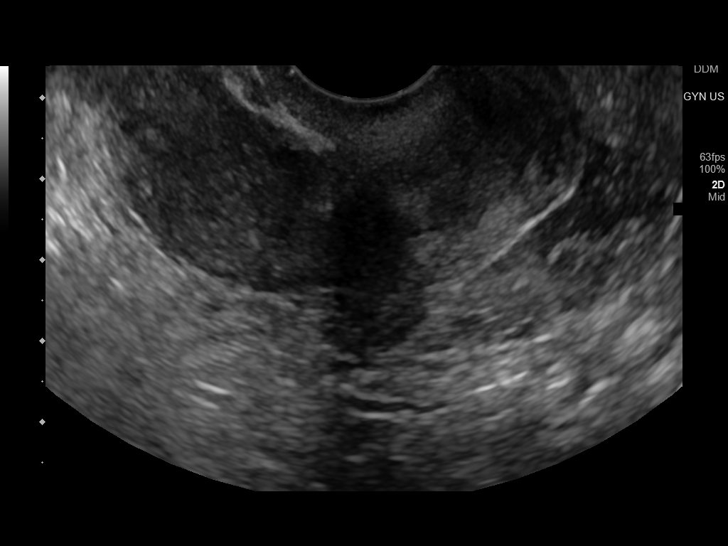
[im 70/130]
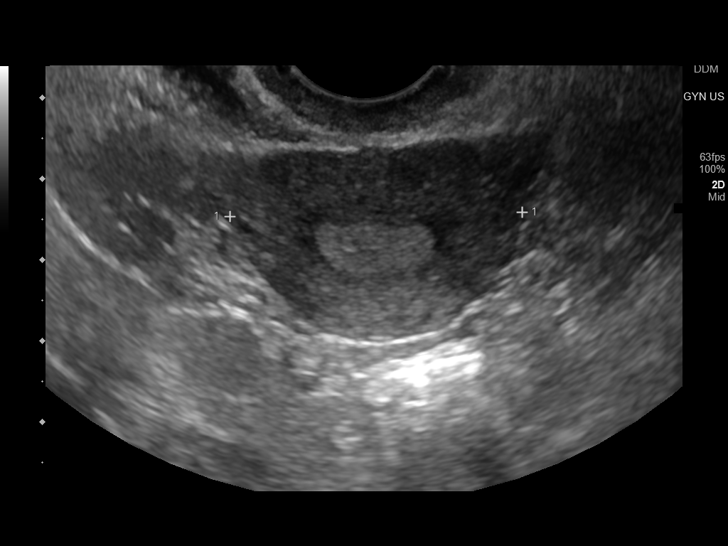
[im 81/130]
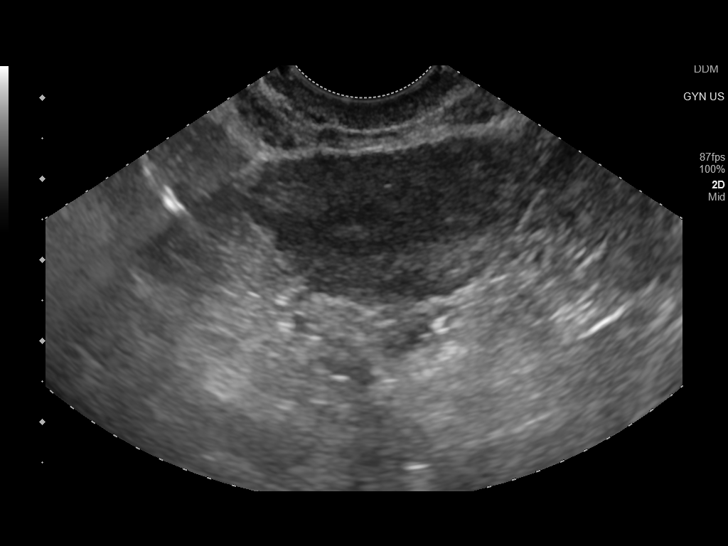
[im 87/130]
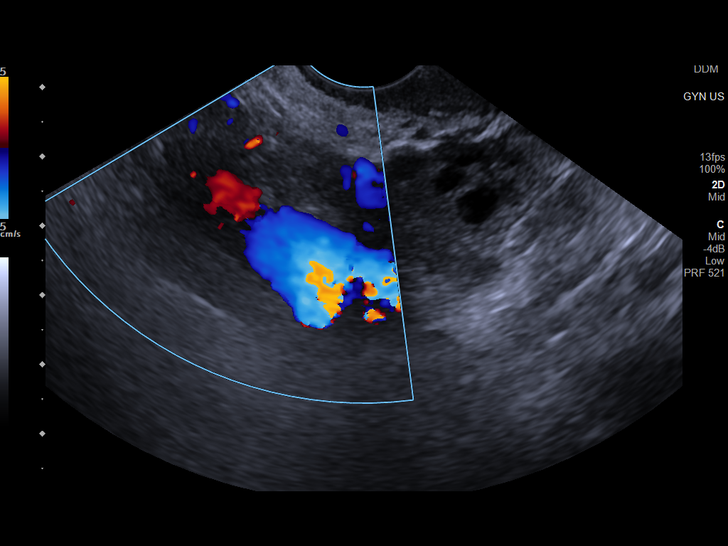
[im 97/130]
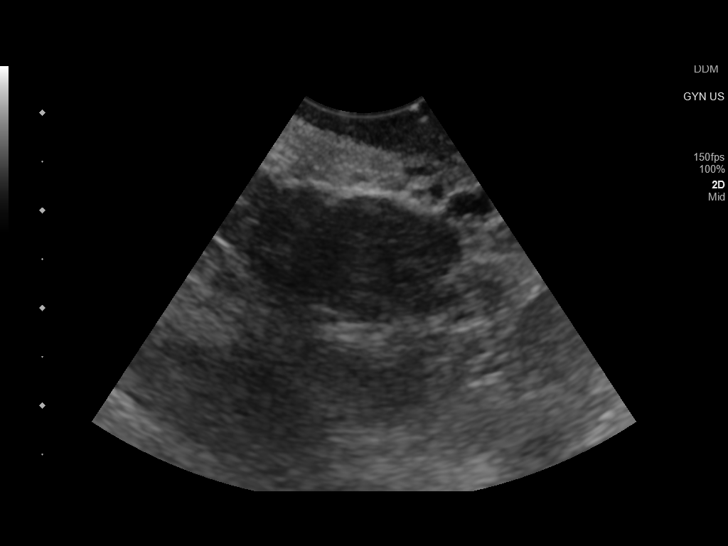
[im 108/130]
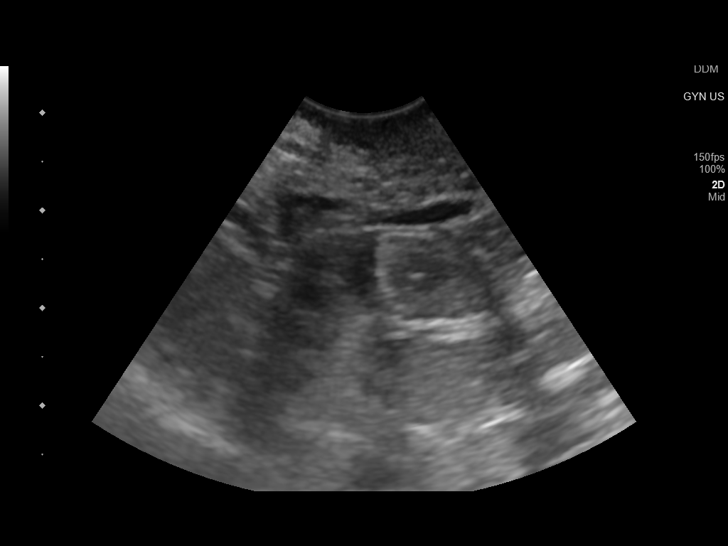
[im 119/130]
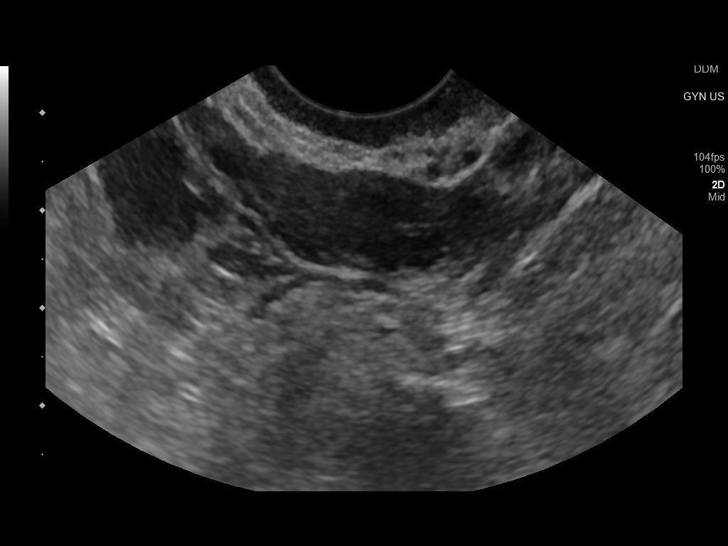
[im 130/130]
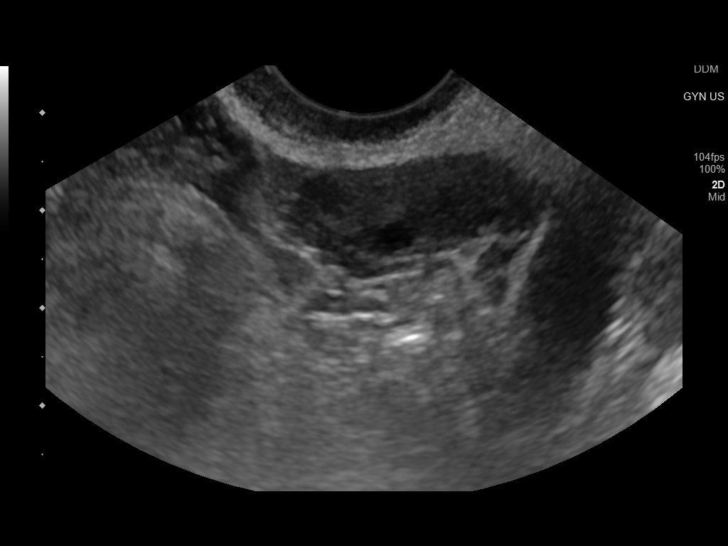

[14 of 25 positions shown; findings below may reference images not displayed]

FINDINGS: Uterus

Measurements: 6.2 x 2.3 x 3.6 cm = volume: 27 mL. Anteverted. Normal
morphology without mass

Endometrium

Thickness: 6 mm.  No endometrial fluid or mass

Right ovary

Measurements: 1.9 x 2.1 x 1.4 cm = volume: 3.0 mL. Normal morphology
without mass

Left ovary

Measurements: 2.8 x 1.1 x 2.2 cm = volume: 3.3 mL. Normal morphology
without mass

Other findings

No free pelvic fluid.  No adnexal masses.
IMPRESSION: Normal exam.

## 2023-01-14 ENCOUNTER — Other Ambulatory Visit: Payer: Self-pay

## 2023-01-14 DIAGNOSIS — S0990XA Unspecified injury of head, initial encounter: Secondary | ICD-10-CM | POA: Diagnosis not present

## 2023-01-14 DIAGNOSIS — Z23 Encounter for immunization: Secondary | ICD-10-CM | POA: Diagnosis not present

## 2023-01-14 DIAGNOSIS — R42 Dizziness and giddiness: Secondary | ICD-10-CM | POA: Diagnosis not present

## 2023-01-14 DIAGNOSIS — W228XXA Striking against or struck by other objects, initial encounter: Secondary | ICD-10-CM | POA: Diagnosis not present

## 2023-01-14 DIAGNOSIS — R110 Nausea: Secondary | ICD-10-CM | POA: Diagnosis not present

## 2023-01-14 DIAGNOSIS — S0001XA Abrasion of scalp, initial encounter: Secondary | ICD-10-CM | POA: Diagnosis not present

## 2023-01-14 DIAGNOSIS — Y33XXXA Other specified events, undetermined intent, initial encounter: Secondary | ICD-10-CM | POA: Diagnosis not present

## 2023-01-14 DIAGNOSIS — Y92252 Music hall as the place of occurrence of the external cause: Secondary | ICD-10-CM | POA: Diagnosis not present

## 2023-01-15 ENCOUNTER — Telehealth: Payer: Self-pay

## 2023-01-16 ENCOUNTER — Telehealth: Payer: Self-pay

## 2023-01-16 ENCOUNTER — Encounter: Payer: Self-pay | Admitting: Internal Medicine

## 2023-01-16 NOTE — Transitions of Care (Post Inpatient/ED Visit) (Signed)
01/16/2023  Name: Aranda Dibble MRN: [id number] DOB: 1990/05/18  Today's TOC FU Call Status: Today's TOC FU Call Status:: Successful TOC FU Call Completed TOC FU Call Complete Date: 01/16/23 Patient's Name and Date of Birth confirmed.  Transition Care Management Follow-up Telephone Call Date of Discharge: 01/15/23 Discharge Facility: Other [facility]) Name of Other (Non-[facility]) Discharge Facility: [facility] Type of Discharge: Emergency Department Reason for ED Visit: Other: (Dizziness and giddiness) How have you been since you were released from the hospital?: Better Any questions or concerns?: No  Items Reviewed: Did you receive and understand the discharge instructions provided?: Yes Medications obtained,verified, and reconciled?: Yes (Medications Reviewed) Any new allergies since your discharge?: No Dietary orders reviewed?: No Do you have support at home?: Yes People in Home: spouse  Medications Reviewed Today: Medications Reviewed Today   Medications were not reviewed in this encounter     Home Care and Equipment/Supplies: Were Home Health Services Ordered?: No Any new equipment or medical supplies ordered?: No  Functional Questionnaire: Do you need assistance with bathing/showering or dressing?: No Do you need assistance with meal preparation?: No Do you need assistance with eating?: No Do you have difficulty maintaining continence: No Do you need assistance with getting out of bed/getting out of a chair/moving?: No Do you have difficulty managing or taking your medications?: No  Follow up appointments reviewed: Specialist Hospital Follow-up appointment confirmed?: NA Do you need transportation to your follow-up appointment?: Yes Do you understand care options if your condition(s) worsen?: Yes-patient verbalized understanding    SIGNATURE Mariann Barter Dorion Petillo, CMA

## 2023-01-20 NOTE — Telephone Encounter (Signed)
ERROR

## 2023-01-26 DIAGNOSIS — Z113 Encounter for screening for infections with a predominantly sexual mode of transmission: Secondary | ICD-10-CM | POA: Diagnosis not present

## 2023-01-26 DIAGNOSIS — N979 Female infertility, unspecified: Secondary | ICD-10-CM | POA: Diagnosis not present

## 2023-02-04 ENCOUNTER — Other Ambulatory Visit
Admission: RE | Admit: 2023-02-04 | Discharge: 2023-02-04 | Disposition: A | Discharge status: Home / Self Care | Payer: Commercial Managed Care - PPO | Attending: Obstetrics and Gynecology | Admitting: Obstetrics and Gynecology

## 2023-02-04 DIAGNOSIS — N979 Female infertility, unspecified: Secondary | ICD-10-CM | POA: Diagnosis not present

## 2023-02-04 DIAGNOSIS — Z113 Encounter for screening for infections with a predominantly sexual mode of transmission: Secondary | ICD-10-CM | POA: Diagnosis not present

## 2023-02-04 LAB — HIV ANTIBODY (ROUTINE TESTING W REFLEX): HIV Screen 4th Generation wRfx: NONREACTIVE

## 2023-02-04 LAB — HEPATITIS B SURFACE ANTIGEN: Hepatitis B Surface Ag: NONREACTIVE

## 2023-02-04 LAB — CHLAMYDIA/NGC RT PCR (ARMC ONLY)
Chlamydia Tr: NOT DETECTED
N gonorrhoeae: NOT DETECTED

## 2023-02-04 LAB — HEPATITIS C ANTIBODY: HCV Ab: NONREACTIVE

## 2023-02-05 LAB — RPR: RPR Ser Ql: NONREACTIVE

## 2023-02-10 ENCOUNTER — Other Ambulatory Visit: Payer: Self-pay

## 2023-02-12 ENCOUNTER — Other Ambulatory Visit: Payer: Self-pay

## 2023-02-12 MED ORDER — NORGESTIMATE-ETH ESTRADIOL 0.25-35 MG-MCG PO TABS
1.0000 | ORAL_TABLET | Freq: Every day | ORAL | 1 refills | Status: DC
  Filled 2023-02-12 (×2): qty 28, 28d supply, fill #0

## 2023-02-16 ENCOUNTER — Other Ambulatory Visit: Payer: Self-pay

## 2023-02-17 ENCOUNTER — Other Ambulatory Visit: Payer: Self-pay

## 2023-02-17 LAB — MISC LABCORP TEST (SEND OUT): Labcorp test code: 500183

## 2023-02-17 MED ORDER — OVIDREL 250 MCG/0.5ML ~~LOC~~ SOSY
250.0000 ug | PREFILLED_SYRINGE | Freq: Once | SUBCUTANEOUS | 2 refills | Status: AC
Start: 2023-02-17 — End: 2023-02-18

## 2023-02-19 ENCOUNTER — Other Ambulatory Visit: Payer: Self-pay

## 2023-02-23 DIAGNOSIS — N979 Female infertility, unspecified: Secondary | ICD-10-CM | POA: Diagnosis not present

## 2023-02-25 ENCOUNTER — Other Ambulatory Visit: Payer: Self-pay

## 2023-02-25 MED ORDER — PROGESTERONE 200 MG PO CAPS
200.0000 mg | ORAL_CAPSULE | Freq: Two times a day (BID) | ORAL | 2 refills | Status: DC
  Filled 2023-02-25: qty 60, 30d supply, fill #0
  Filled 2023-03-10 – 2023-04-23 (×2): qty 60, 30d supply, fill #1
  Filled 2023-05-18: qty 60, 30d supply, fill #2

## 2023-03-02 ENCOUNTER — Encounter: Payer: Self-pay | Admitting: Obstetrics and Gynecology

## 2023-03-04 ENCOUNTER — Other Ambulatory Visit: Payer: Self-pay

## 2023-03-04 MED ORDER — DOXYCYCLINE HYCLATE 100 MG PO CAPS
100.0000 mg | ORAL_CAPSULE | Freq: Two times a day (BID) | ORAL | 0 refills | Status: DC
  Filled 2023-03-04: qty 14, 7d supply, fill #0

## 2023-03-09 ENCOUNTER — Other Ambulatory Visit: Payer: Self-pay | Admitting: Internal Medicine

## 2023-03-09 ENCOUNTER — Ambulatory Visit
Admission: RE | Admit: 2023-03-09 | Discharge: 2023-03-09 | Disposition: A | Discharge status: Home / Self Care | Payer: Commercial Managed Care - PPO | Attending: Internal Medicine | Admitting: Internal Medicine

## 2023-03-09 ENCOUNTER — Ambulatory Visit
Admission: RE | Admit: 2023-03-09 | Discharge: 2023-03-09 | Disposition: A | Discharge status: Home / Self Care | Payer: Commercial Managed Care - PPO | Source: Ambulatory Visit | Attending: Internal Medicine | Admitting: Internal Medicine

## 2023-03-09 ENCOUNTER — Encounter: Payer: Self-pay | Admitting: Internal Medicine

## 2023-03-09 DIAGNOSIS — M25571 Pain in right ankle and joints of right foot: Secondary | ICD-10-CM | POA: Diagnosis not present

## 2023-03-09 DIAGNOSIS — M2011 Hallux valgus (acquired), right foot: Secondary | ICD-10-CM | POA: Diagnosis not present

## 2023-03-09 DIAGNOSIS — S93401A Sprain of unspecified ligament of right ankle, initial encounter: Secondary | ICD-10-CM | POA: Diagnosis not present

## 2023-03-10 ENCOUNTER — Other Ambulatory Visit: Payer: Self-pay

## 2023-04-08 ENCOUNTER — Encounter: Payer: Self-pay | Admitting: Pharmacist

## 2023-04-08 ENCOUNTER — Other Ambulatory Visit: Payer: Self-pay

## 2023-04-08 MED ORDER — LETROZOLE 2.5 MG PO TABS
5.0000 mg | ORAL_TABLET | Freq: Every day | ORAL | 1 refills | Status: DC
  Filled 2023-04-08: qty 10, 5d supply, fill #0

## 2023-04-08 MED ORDER — OVIDREL 250 MCG/0.5ML ~~LOC~~ SOSY: PREFILLED_SYRINGE | SUBCUTANEOUS | 2 refills | Status: DC

## 2023-04-09 ENCOUNTER — Other Ambulatory Visit: Payer: Self-pay

## 2023-04-20 DIAGNOSIS — N979 Female infertility, unspecified: Secondary | ICD-10-CM | POA: Diagnosis not present

## 2023-04-23 ENCOUNTER — Other Ambulatory Visit: Payer: Self-pay

## 2023-05-08 DIAGNOSIS — N912 Amenorrhea, unspecified: Secondary | ICD-10-CM | POA: Diagnosis not present

## 2023-05-12 DIAGNOSIS — Z3141 Encounter for fertility testing: Secondary | ICD-10-CM | POA: Diagnosis not present

## 2023-05-25 DIAGNOSIS — Z3A01 Less than 8 weeks gestation of pregnancy: Secondary | ICD-10-CM | POA: Diagnosis not present

## 2023-05-25 DIAGNOSIS — Z3689 Encounter for other specified antenatal screening: Secondary | ICD-10-CM | POA: Diagnosis not present

## 2023-06-01 DIAGNOSIS — N912 Amenorrhea, unspecified: Secondary | ICD-10-CM | POA: Diagnosis not present

## 2023-06-03 ENCOUNTER — Other Ambulatory Visit: Payer: Self-pay

## 2023-06-03 ENCOUNTER — Telehealth: Payer: Self-pay

## 2023-06-03 ENCOUNTER — Ambulatory Visit (INDEPENDENT_AMBULATORY_CARE_PROVIDER_SITE_OTHER): Payer: Commercial Managed Care - PPO

## 2023-06-03 VITALS — Wt 150.0 lb

## 2023-06-03 DIAGNOSIS — Z3689 Encounter for other specified antenatal screening: Secondary | ICD-10-CM

## 2023-06-03 DIAGNOSIS — Z3480 Encounter for supervision of other normal pregnancy, unspecified trimester: Secondary | ICD-10-CM | POA: Insufficient documentation

## 2023-06-03 DIAGNOSIS — Z3A01 Less than 8 weeks gestation of pregnancy: Secondary | ICD-10-CM | POA: Diagnosis not present

## 2023-06-03 DIAGNOSIS — Z3481 Encounter for supervision of other normal pregnancy, first trimester: Secondary | ICD-10-CM

## 2023-06-03 MED ORDER — PROGESTERONE 200 MG PO CAPS
ORAL_CAPSULE | Freq: Two times a day (BID) | ORAL | 2 refills | Status: DC
  Filled 2023-06-03: qty 60, 30d supply, fill #0

## 2023-06-03 NOTE — Patient Instructions (Signed)
 First Trimester of Pregnancy  The first trimester of pregnancy starts on the first day of your last monthly period until the end of week 13. This is months 1 through 3 of pregnancy. A week after a sperm fertilizes an egg, the egg will implant into the wall of the uterus and begin to develop into a baby. Body changes during your first trimester Your body goes through many changes during pregnancy. The changes usually return to normal after your baby is born. Physical changes Your breasts may grow larger and may hurt. The area around your nipples may get darker. Your periods will stop. Your hair and nails may grow faster. You may pee more often. Health changes You may tire easily. Your gums may bleed and may be sensitive when you brush and floss. You may not feel hungry. You may have heartburn. You may throw up or feel like you may throw up. You may want to eat some foods, but not others. You may have headaches. You may have trouble pooping (constipation). Other changes Your emotions may change from day to day. You may have more dreams. Follow these instructions at home: Medicines Talk to your health care provider if you're taking medicines. Ask if the medicines are safe to take during pregnancy. Your provider may change the medicines that you take. Do not take any medicines unless told to by your provider. Take a prenatal vitamin that has at least 600 micrograms (mcg) of folic acid. Do not use herbal medicines, illegal substances, or medicines that are not approved by your provider. Eating and drinking While you're pregnant your body needs extra food for your growing baby. Talk with your provider about what to eat while pregnant. Activity Most women are able to exercise during pregnancy. Exercises may need to change as your pregnancy goes on. Talk to your provider about your activities and exercise routines. Relieving pain and discomfort Wear a good, supportive bra if your breasts  hurt. Rest with your legs raised if you have leg cramps or low back pain. Safety Wear your seatbelt at all times when you're in a car. Talk to your provider if someone hits you, hurts you, or yells at you. Talk with your provider if you're feeling sad or have thoughts of hurting yourself. Lifestyle Certain things can be harmful while you're pregnant. Follow these rules: Do not use hot tubs, steam rooms, or saunas. Do not douche. Do not use tampons or scented pads. Do not drink alcohol,smoke, vape, or use products with nicotine or tobacco in them. If you need help quitting, talk with your provider. Avoid cat litter boxes and soil used by cats. These things carry germs that can cause harm to your pregnancy and your baby. General instructions Keep all follow-up visits. It helps you and your unborn baby stay as healthy as possible. Write down your questions. Take them to your visits. Your provider will: Talk with you about your overall health. Give you advice or refer you to specialists who can help with different needs, including: Prenatal education classes. Mental health and counseling. Foods and healthy eating. Ask for help if you need help with food. Call your dentist and ask to be seen. Brush your teeth with a soft toothbrush. Floss gently. Where to find more information American Pregnancy Association: americanpregnancy.org [employer]: acog.org Office on [employer] Health: [employer] Contact a health care provider if: You feel dizzy, faint, or have a fever. You vomit or have watery poop (diarrhea) for 2  days or more. You have abnormal discharge or bleeding from your vagina. You have pain when you pee or your pee smells bad. You have cramps, pain, or pressure in your belly area. Get help right away if: You have trouble breathing or chest pain. You have any kind of injury, such as from a fall or a car crash. These symptoms may be an  emergency. Get help right away. Call 911. Do not wait to see if the symptoms will go away. Do not drive yourself to the hospital. This information is not intended to replace advice given to you by your health care provider. Make sure you discuss any questions you have with your health care provider. Document Revised: 01/15/2023 Document Reviewed: 08/15/2022 Elsevier Patient Education  2024 Elsevier Inc.   Common Medications Safe in Pregnancy  Acne:      Constipation:  Benzoyl Peroxide     Colace  Clindamycin      Dulcolax Suppository  Topica Erythromycin     Fibercon  Salicylic Acid      Metamucil         Miralax AVOID:        Senakot   Accutane    Cough:  Retin-A       Cough Drops  Tetracycline      Phenergan w/ Codeine if Rx  Minocycline      Robitussin (Plain & DM)  Antibiotics:     Crabs/Lice:  Ceclor       RID  Cephalosporins    AVOID:  E-Mycins      Kwell  Keflex  Macrobid/Macrodantin   Diarrhea:  Penicillin      Kao-Pectate  Zithromax      Imodium AD         PUSH FLUIDS AVOID:       Cipro     Fever:  Tetracycline      Tylenol (Regular or Extra  Minocycline       Strength)  Levaquin      Extra Strength-Do not          Exceed 8 tabs/24 hrs Caffeine:        200mg /day (equiv. To 1 cup of coffee or  approx. 3 12 oz sodas)         Gas: Cold/Hayfever:       Gas-X  Benadryl      Mylicon  Claritin       Phazyme  **Claritin-D        Chlor-Trimeton    Headaches:  Dimetapp      ASA-Free Excedrin  Drixoral-Non-Drowsy     Cold Compress  Mucinex (Guaifenasin)     Tylenol (Regular or Extra  Sudafed/Sudafed-12 Hour     Strength)  **Sudafed PE Pseudoephedrine   Tylenol Cold & Sinus     Vicks Vapor Rub  Zyrtec  **AVOID if Problems With Blood Pressure         Heartburn: Avoid lying down for at least 1 hour after meals  Aciphex      Maalox     Rash:  Milk of Magnesia     Benadryl    Mylanta       1% Hydrocortisone Cream  Pepcid  Pepcid Complete   Sleep  Aids:  Prevacid      Ambien   Prilosec       Benadryl  Rolaids       Chamomile Tea  Tums (Limit 4/day)     Unisom  Tylenol PM         Warm milk-add vanilla or  Hemorrhoids:       Sugar for taste  Anusol/Anusol H.C.  (RX: Analapram 2.5%)  Sugar Substitutes:  Hydrocortisone OTC     Ok in moderation  Preparation H      Tucks        Vaseline lotion applied to tissue with wiping    Herpes:     Throat:  Acyclovir      Oragel  Famvir  Valtrex     Vaccines:         Flu Shot Leg Cramps:       *Gardasil  Benadryl      Hepatitis A         Hepatitis B Nasal Spray:       Pneumovax  Saline Nasal Spray     Polio Booster         Tetanus Nausea:       Tuberculosis test or PPD  Vitamin B6 25 mg TID   AVOID:    Dramamine      *Gardasil  Emetrol       Live Poliovirus  Ginger Root 250 mg QID    MMR (measles, mumps &  High Complex Carbs @ Bedtime    rebella)  Sea Bands-Accupressure    Varicella (Chickenpox)  Unisom 1/2 tab TID     *No known complications           If received before Pain:         Known pregnancy;   Darvocet       Resume series after  Lortab        Delivery  Percocet    Yeast:   Tramadol      Femstat  Tylenol 3      Gyne-lotrimin  Ultram       Monistat  Vicodin           MISC:         All Sunscreens           Hair Coloring/highlights          Insect Repellant's          (Including DEET)         Mystic Tans   Commonly Asked Questions During Pregnancy   Cats: A parasite can be excreted in cat feces.  To avoid exposure you need to have another person empty the little box.  If you must empty the litter box you will need to wear gloves.  Wash your hands after handling your cat.  This parasite can also be found in raw or undercooked meat so this should also be avoided.  Colds, Sore Throats, Flu: Please check your medication sheet to see what you can take for symptoms.  If your symptoms are unrelieved by these medications please call the office.  Dental Work: Most  any dental work Agricultural consultant recommends is permitted.  X-rays should only be taken during the first trimester if absolutely necessary.  Your abdomen should be shielded with a lead apron during all x-rays.  Please notify your provider prior to receiving any x-rays.  Novocaine is fine; gas is not recommended.  If your dentist requires a note from [place] prior to dental work please call the office and we will provide one for you.  Exercise: Exercise is an important part of staying healthy during your pregnancy.  You may continue most exercises you were accustomed to prior to pregnancy.  Later in your pregnancy you will most likely notice you have difficulty with activities requiring balance like riding a bicycle.  It is important that you listen to your body and avoid activities that put you at a higher risk of falling.  Adequate rest and staying well hydrated are a must!  If you have questions about the safety of specific activities ask your provider.    Exposure to Children with illness: Try to avoid obvious exposure; report any symptoms to [place] when noted,  If you have chicken pos, red measles or mumps, you should be immune to these diseases.   Please do not take any vaccines while pregnant unless you have checked with your OB provider.  Fetal Movement: After 28 weeks we recommend you do "kick counts" twice daily.  Lie or sit down in a calm quiet environment and count your baby movements "kicks".  You should feel your baby at least 10 times per hour.  If you have not felt 10 kicks within the first hour get up, walk around and have something sweet to eat or drink then repeat for an additional hour.  If count remains less than 10 per hour notify your provider.  Fumigating: Follow your pest control agent's advice as to how long to stay out of your home.  Ventilate the area well before re-entering.  Hemorrhoids:   Most over-the-counter preparations can be used during pregnancy.  Check your medication to see what is  safe to use.  It is important to use a stool softener or fiber in your diet and to drink lots of liquids.  If hemorrhoids seem to be getting worse please call the office.   Hot Tubs:  Hot tubs Jacuzzis and saunas are not recommended while pregnant.  These increase your internal body temperature and should be avoided.  Intercourse:  Sexual intercourse is safe during pregnancy as long as you are comfortable, unless otherwise advised by your provider.  Spotting may occur after intercourse; report any bright red bleeding that is heavier than spotting.  Labor:  If you know that you are in labor, please go to the hospital.  If you are unsure, please call the office and let us help you decide what to do.  Lifting, straining, etc:  If your job requires heavy lifting or straining please check with your provider for any limitations.  Generally, you should not lift items heavier than that you can lift simply with your hands and arms (no back muscles)  Painting:  Paint fumes do not harm your pregnancy, but may make you ill and should be avoided if possible.  Latex or water based paints have less odor than oils.  Use adequate ventilation while painting.  Permanents & Hair Color:  Chemicals in hair dyes are not recommended as they cause increase hair dryness which can increase hair loss during pregnancy.  " Highlighting" and permanents are allowed.  Dye may be absorbed differently and permanents may not hold as well during pregnancy.  Sunbathing:  Use a sunscreen, as skin burns easily during pregnancy.  Drink plenty of fluids; avoid over heating.  Tanning Beds:  Because their possible side effects are still unknown, tanning beds are not recommended.  Ultrasound Scans:  Routine ultrasounds are performed at approximately 20 weeks.  You will be able to see your baby's general anatomy an if you would like to know the gender this can usually be determined as well.  If it is questionable when you conceived you may  also  receive an ultrasound early in your pregnancy for dating purposes.  Otherwise ultrasound exams are not routinely performed unless there is a medical necessity.  Although you can request a scan we ask that you pay for it when conducted because insurance does not cover " patient request" scans.  Work: If your pregnancy proceeds without complications you may work until your due date, unless your physician or employer advises otherwise.  Round Ligament Pain/Pelvic Discomfort:  Sharp, shooting pains not associated with bleeding are fairly common, usually occurring in the second trimester of pregnancy.  They tend to be worse when standing up or when you remain standing for long periods of time.  These are the result of pressure of certain pelvic ligaments called "round ligaments".  Rest, Tylenol and heat seem to be the most effective relief.  As the womb and fetus grow, they rise out of the pelvis and the discomfort improves.  Please notify the office if your pain seems different than that described.  It may represent a more serious condition.

## 2023-06-03 NOTE — Progress Notes (Signed)
 Patient was seen in office today for NOB intake, patient is approximately 8 weeks and states that she has noticed increased heart since pregnancy and states that at times resting heart rate is above 110 and can get as high as 130 when working out. Patient was advised that when she is feeling short of breath to stop activity and try slow deep breath techniques, patient advised that if shortness of breath became severe associated with chest pain and back pain to visit ED for evaluation. Patient reported concerns of nausea and stats that she has tried wearing prenatal bracelet on wrist to help with nausea, she has tried also vitamin B6 and ginger with no improvement. Patient would like to know if there is anything else she can do to help with symptoms of nausea? Patient has her initial prenatal visit with Dr. Connell on 07/07/2023 and patient states that since she is 8 weeks now she prefers to have new OB labs drawn prior to visit with doctor. Please advise if okay to order labs and medication/prescription to help with nausea. Thank you. KW

## 2023-06-03 NOTE — Progress Notes (Signed)
 New OB Intake  I connected with  Susan  Mylinda Hoffman on 06/03/23 at  1:15 PM EST by person Video Visit and verified that I am speaking with the correct person using two identifiers. Nurse is located at [facility] and pt is located at [facility].  I discussed the limitations, risks, security and privacy concerns of performing an evaluation and management service by telephone and the availability of in person appointments. I also discussed with the patient that there may be a patient responsible charge related to this service. The patient expressed understanding and agreed to proceed.  I explained I am completing New OB Intake today. We discussed her EDD of 01/13/2024 that is based on LMP of 04/08/2023. Pt is G2/P1. I reviewed her allergies, medications, Medical/Surgical/OB history, and appropriate screenings. There are cats in the home? Home, no Based on history, this is a/an pregnancy uncomplicated . Her obstetrical history is significant for  none .  Patient Active Problem List   Diagnosis Date Noted   Supervision of other normal pregnancy, antepartum 06/03/2023   Ankle pain, right 10/11/2019   Abnormal Pap smear of cervix 02/13/2017   Attention deficit hyperactivity disorder (ADHD), predominantly hyperactive type 02/13/2017   Nummular dermatitis 02/13/2017   Deafness in right ear 02/13/2017   Migraine headache with aura 01/07/2012    Concerns addressed today  Delivery Plans:  Plans to deliver at [facility]  Anatomy US  Explained first scheduled US  will be around 19 weeks.Patient reports today that she has had two ultrasounds with [facility] on 05/25/23 and on 06/01/23 Labs Discussed genetic screening with patient. Patient will be getting genetic testing to be drawn at new OB visit. Discussed possible labs to be drawn at new OB appointment.  COVID Vaccine Patient has not had COVID vaccine.   Social Determinants of Health Food Insecurity: denies food insecurity WIC  Referral: Patient is not interested in referral to [facility]  Transportation: Patient denies transportation needs. Childcare: Discussed no children allowed at ultrasound appointments.   First visit review I reviewed new OB appt with pt. I explained she will have blood work and pap smear/pelvic exam if indicated. Explained pt will be seen by Dr. Archie Savers 07/07/2023 at first visit; encounter routed to appropriate provider.   Rollo JINNY Maxin, CMA 06/03/2023  1:47 PM

## 2023-06-04 NOTE — Progress Notes (Signed)
 Mychart message sent to pt with Melissa's advise.

## 2023-06-12 ENCOUNTER — Encounter: Payer: Self-pay | Admitting: Obstetrics and Gynecology

## 2023-06-15 ENCOUNTER — Other Ambulatory Visit: Payer: Self-pay

## 2023-06-15 DIAGNOSIS — Z3480 Encounter for supervision of other normal pregnancy, unspecified trimester: Secondary | ICD-10-CM

## 2023-06-15 DIAGNOSIS — Z1379 Encounter for other screening for genetic and chromosomal anomalies: Secondary | ICD-10-CM

## 2023-06-17 ENCOUNTER — Other Ambulatory Visit: Payer: Commercial Managed Care - PPO

## 2023-06-17 DIAGNOSIS — Z3A10 10 weeks gestation of pregnancy: Secondary | ICD-10-CM | POA: Diagnosis not present

## 2023-06-17 DIAGNOSIS — Z1379 Encounter for other screening for genetic and chromosomal anomalies: Secondary | ICD-10-CM

## 2023-06-17 DIAGNOSIS — Z3480 Encounter for supervision of other normal pregnancy, unspecified trimester: Secondary | ICD-10-CM | POA: Diagnosis not present

## 2023-06-19 ENCOUNTER — Other Ambulatory Visit: Payer: Self-pay | Admitting: Obstetrics and Gynecology

## 2023-06-19 DIAGNOSIS — O3680X0 Pregnancy with inconclusive fetal viability, not applicable or unspecified: Secondary | ICD-10-CM

## 2023-06-21 ENCOUNTER — Encounter: Payer: Self-pay | Admitting: Obstetrics and Gynecology

## 2023-06-21 LAB — MATERNIT 21 PLUS CORE, BLOOD
Fetal Fraction: 17
Result (T21): NEGATIVE
Trisomy 13 (Patau syndrome): NEGATIVE
Trisomy 18 (Edwards syndrome): NEGATIVE
Trisomy 21 (Down syndrome): NEGATIVE

## 2023-06-22 ENCOUNTER — Encounter: Payer: Self-pay | Admitting: Obstetrics and Gynecology

## 2023-06-22 ENCOUNTER — Ambulatory Visit (INDEPENDENT_AMBULATORY_CARE_PROVIDER_SITE_OTHER): Payer: Commercial Managed Care - PPO

## 2023-06-22 DIAGNOSIS — Z3687 Encounter for antenatal screening for uncertain dates: Secondary | ICD-10-CM | POA: Diagnosis not present

## 2023-06-22 DIAGNOSIS — Z3A10 10 weeks gestation of pregnancy: Secondary | ICD-10-CM

## 2023-06-22 DIAGNOSIS — O3680X0 Pregnancy with inconclusive fetal viability, not applicable or unspecified: Secondary | ICD-10-CM

## 2023-06-26 NOTE — Telephone Encounter (Signed)
 Do you know how long this takes?

## 2023-07-03 DIAGNOSIS — Z113 Encounter for screening for infections with a predominantly sexual mode of transmission: Secondary | ICD-10-CM | POA: Diagnosis not present

## 2023-07-03 DIAGNOSIS — Z3401 Encounter for supervision of normal first pregnancy, first trimester: Secondary | ICD-10-CM | POA: Diagnosis not present

## 2023-07-03 DIAGNOSIS — Z3A12 12 weeks gestation of pregnancy: Secondary | ICD-10-CM | POA: Diagnosis not present

## 2023-07-03 DIAGNOSIS — Z124 Encounter for screening for malignant neoplasm of cervix: Secondary | ICD-10-CM | POA: Diagnosis not present

## 2023-07-03 DIAGNOSIS — Z1379 Encounter for other screening for genetic and chromosomal anomalies: Secondary | ICD-10-CM | POA: Diagnosis not present

## 2023-07-03 DIAGNOSIS — Z3491 Encounter for supervision of normal pregnancy, unspecified, first trimester: Secondary | ICD-10-CM | POA: Diagnosis not present

## 2023-07-03 DIAGNOSIS — Z3481 Encounter for supervision of other normal pregnancy, first trimester: Secondary | ICD-10-CM | POA: Diagnosis not present

## 2023-07-06 ENCOUNTER — Encounter: Payer: Commercial Managed Care - PPO | Admitting: Licensed Practical Nurse

## 2023-07-07 ENCOUNTER — Encounter: Payer: Commercial Managed Care - PPO | Admitting: Obstetrics and Gynecology

## 2023-07-23 DIAGNOSIS — O99891 Other specified diseases and conditions complicating pregnancy: Secondary | ICD-10-CM | POA: Diagnosis not present

## 2023-07-23 DIAGNOSIS — I959 Hypotension, unspecified: Secondary | ICD-10-CM | POA: Diagnosis not present

## 2023-07-23 DIAGNOSIS — O2652 Maternal hypotension syndrome, second trimester: Secondary | ICD-10-CM | POA: Diagnosis not present

## 2023-07-23 DIAGNOSIS — R519 Headache, unspecified: Secondary | ICD-10-CM | POA: Diagnosis not present

## 2023-07-23 DIAGNOSIS — R42 Dizziness and giddiness: Secondary | ICD-10-CM | POA: Diagnosis not present

## 2023-07-23 DIAGNOSIS — R110 Nausea: Secondary | ICD-10-CM | POA: Diagnosis not present

## 2023-07-23 DIAGNOSIS — Z5329 Procedure and treatment not carried out because of patient's decision for other reasons: Secondary | ICD-10-CM | POA: Diagnosis not present

## 2023-07-23 DIAGNOSIS — Z3A15 15 weeks gestation of pregnancy: Secondary | ICD-10-CM | POA: Diagnosis not present

## 2023-07-23 DIAGNOSIS — H532 Diplopia: Secondary | ICD-10-CM | POA: Diagnosis not present

## 2023-07-23 DIAGNOSIS — R5383 Other fatigue: Secondary | ICD-10-CM | POA: Diagnosis not present

## 2023-08-05 ENCOUNTER — Encounter: Payer: Self-pay | Admitting: Internal Medicine

## 2023-08-05 NOTE — Telephone Encounter (Signed)
 Please review.  KP

## 2023-08-13 ENCOUNTER — Other Ambulatory Visit: Payer: Self-pay | Admitting: Internal Medicine

## 2023-08-13 NOTE — Progress Notes (Unsigned)
 Date:  08/13/2023   Name:  Susan Hoffman   DOB:  [birth year]   MRN:  [id number]   Chief Complaint: No chief complaint on file.  HPI  Review of Systems   Lab Results  Component Value Date   NA 140 08/08/2022   K 5.2 08/08/2022   CO2 23 08/08/2022   GLUCOSE 86 08/08/2022   BUN 9 08/08/2022   CREATININE 0.90 08/08/2022   CALCIUM 9.8 08/08/2022   EGFR 88 08/08/2022   GFRNONAA 79 06/27/2019   Lab Results  Component Value Date   CHOL 173 08/08/2022   HDL 64 08/08/2022   LDLCALC 96 08/08/2022   TRIG 65 08/08/2022   CHOLHDL 2.7 08/08/2022   Lab Results  Component Value Date   TSH 0.891 08/08/2022   Lab Results  Component Value Date   HGBA1C 5.1 08/08/2022   Lab Results  Component Value Date   WBC 7.0 08/08/2022   HGB 14.4 08/08/2022   HCT 43.3 08/08/2022   MCV 94 08/08/2022   PLT 321 08/08/2022   Lab Results  Component Value Date   ALT 11 08/08/2022   AST 15 08/08/2022   ALKPHOS 73 08/08/2022   BILITOT 0.7 08/08/2022   No results found for: "25OHVITD2", "25OHVITD3", "VD25OH"   Patient Active Problem List   Diagnosis Date Noted   Supervision of other normal pregnancy, antepartum 06/03/2023   Ankle pain, right 10/11/2019   Abnormal Pap smear of cervix 02/13/2017   Attention deficit hyperactivity disorder (ADHD), predominantly hyperactive type 02/13/2017   Nummular dermatitis 02/13/2017   Deafness in right ear 02/13/2017   Migraine headache with aura 01/07/2012    Allergies  Allergen Reactions   Covid-19 (Mrna) Vaccine Anaphylaxis, Other (See Comments) and Shortness Of Breath   Cefdinir Diarrhea   Codeine    Mushroom Extract Complex (Obsolete)     Past Surgical History:  Procedure Laterality Date   nexplanon  2017   TYMPANOPLASTY     wisdom teeth removal      Social History   Tobacco Use   Smoking status: Never   Smokeless tobacco: Never  Vaping Use   Vaping status: Never Used  Substance Use Topics   Alcohol use: Not Currently     Alcohol/week: 0.0 - 1.0 standard drinks of alcohol    Comment: 1 glass wine every 2 weeks   Drug use: No     Medication list has been reviewed and updated.  No outpatient medications have been marked as taking for the 08/13/23 encounter (Orders Only) with Reubin Milan, MD.       08/08/2022    8:14 AM 07/03/2021    8:09 AM 06/28/2020    7:51 AM 06/12/2020    4:03 PM  GAD 7 : Generalized Anxiety Score  Nervous, Anxious, on Edge 0 0 0 0  Control/stop worrying 0 0 0 0  Worry too much - different things 0 0 0 0  Trouble relaxing 0 0 0 0  Restless 0 0 0 0  Easily annoyed or irritable 0 0 0 0  Afraid - awful might happen 0 0 0 0  Total GAD 7 Score 0 0 0 0  Anxiety Difficulty Not difficult at all Not difficult at all  Not difficult at all       06/03/2023    1:20 PM 08/08/2022    8:14 AM 07/03/2021    8:09 AM  Depression screen PHQ 2/9  Decreased Interest 0 0 0  Down, Depressed,  Hopeless 0 0 0  PHQ - 2 Score 0 0 0  Altered sleeping  0 0  Tired, decreased energy  2 2  Change in appetite  0 0  Feeling bad or failure about yourself   0 0  Trouble concentrating  0 0  Moving slowly or fidgety/restless  0 0  Suicidal thoughts  0 0  PHQ-9 Score  2 2  Difficult doing work/chores  Not difficult at all Not difficult at all    BP Readings from Last 3 Encounters:  08/08/22 122/78  07/03/21 122/78  03/15/21 (!) 116/52    Physical Exam  Wt Readings from Last 3 Encounters:  06/03/23 150 lb (68 kg)  08/08/22 151 lb (68.5 kg)  07/03/21 162 lb 3.2 oz (73.6 kg)    LMP 04/08/2023   Assessment and Plan:  Problem List Items Addressed This Visit   None   No follow-ups on file.    Sheron Dixons, MD [facility] [place]

## 2023-08-14 ENCOUNTER — Encounter: Payer: Self-pay | Admitting: Internal Medicine

## 2023-08-14 DIAGNOSIS — O358XX0 Maternal care for other (suspected) fetal abnormality and damage, not applicable or unspecified: Secondary | ICD-10-CM | POA: Diagnosis not present

## 2023-08-14 DIAGNOSIS — Z3A18 18 weeks gestation of pregnancy: Secondary | ICD-10-CM | POA: Diagnosis not present

## 2023-08-14 DIAGNOSIS — Z3482 Encounter for supervision of other normal pregnancy, second trimester: Secondary | ICD-10-CM | POA: Diagnosis not present

## 2023-08-14 DIAGNOSIS — Z3689 Encounter for other specified antenatal screening: Secondary | ICD-10-CM | POA: Diagnosis not present

## 2023-09-09 DIAGNOSIS — G43909 Migraine, unspecified, not intractable, without status migrainosus: Secondary | ICD-10-CM | POA: Diagnosis not present

## 2023-09-09 DIAGNOSIS — O99352 Diseases of the nervous system complicating pregnancy, second trimester: Secondary | ICD-10-CM | POA: Diagnosis not present

## 2023-09-09 DIAGNOSIS — H538 Other visual disturbances: Secondary | ICD-10-CM | POA: Diagnosis not present

## 2023-09-09 DIAGNOSIS — Z3A22 22 weeks gestation of pregnancy: Secondary | ICD-10-CM | POA: Diagnosis not present

## 2023-09-09 DIAGNOSIS — I959 Hypotension, unspecified: Secondary | ICD-10-CM | POA: Diagnosis not present

## 2023-09-09 DIAGNOSIS — R42 Dizziness and giddiness: Secondary | ICD-10-CM | POA: Diagnosis not present

## 2023-09-29 DIAGNOSIS — O0992 Supervision of high risk pregnancy, unspecified, second trimester: Secondary | ICD-10-CM | POA: Diagnosis not present

## 2023-09-29 DIAGNOSIS — O358XX0 Maternal care for other (suspected) fetal abnormality and damage, not applicable or unspecified: Secondary | ICD-10-CM | POA: Diagnosis not present

## 2023-10-02 DIAGNOSIS — O358XX0 Maternal care for other (suspected) fetal abnormality and damage, not applicable or unspecified: Secondary | ICD-10-CM | POA: Diagnosis not present

## 2023-10-02 DIAGNOSIS — Z3A25 25 weeks gestation of pregnancy: Secondary | ICD-10-CM | POA: Diagnosis not present

## 2023-10-15 DIAGNOSIS — Z3483 Encounter for supervision of other normal pregnancy, third trimester: Secondary | ICD-10-CM | POA: Diagnosis not present

## 2023-10-15 DIAGNOSIS — Z3482 Encounter for supervision of other normal pregnancy, second trimester: Secondary | ICD-10-CM | POA: Diagnosis not present

## 2023-10-27 ENCOUNTER — Encounter: Payer: Self-pay | Admitting: Internal Medicine

## 2023-10-27 ENCOUNTER — Telehealth: Admitting: Physician Assistant

## 2023-10-27 DIAGNOSIS — Z23 Encounter for immunization: Secondary | ICD-10-CM | POA: Diagnosis not present

## 2023-10-27 DIAGNOSIS — H9201 Otalgia, right ear: Secondary | ICD-10-CM

## 2023-10-27 DIAGNOSIS — D681 Hereditary factor XI deficiency: Secondary | ICD-10-CM | POA: Diagnosis not present

## 2023-10-27 DIAGNOSIS — R519 Headache, unspecified: Secondary | ICD-10-CM | POA: Diagnosis not present

## 2023-10-27 DIAGNOSIS — Z148 Genetic carrier of other disease: Secondary | ICD-10-CM | POA: Diagnosis not present

## 2023-10-27 DIAGNOSIS — Z3483 Encounter for supervision of other normal pregnancy, third trimester: Secondary | ICD-10-CM | POA: Diagnosis not present

## 2023-10-27 DIAGNOSIS — O0992 Supervision of high risk pregnancy, unspecified, second trimester: Secondary | ICD-10-CM | POA: Diagnosis not present

## 2023-10-27 DIAGNOSIS — O26899 Other specified pregnancy related conditions, unspecified trimester: Secondary | ICD-10-CM | POA: Diagnosis not present

## 2023-10-27 DIAGNOSIS — Z3A28 28 weeks gestation of pregnancy: Secondary | ICD-10-CM | POA: Diagnosis not present

## 2023-10-27 DIAGNOSIS — O35HXX0 Maternal care for other (suspected) fetal abnormality and damage, fetal lower extremities anomalies, not applicable or unspecified: Secondary | ICD-10-CM | POA: Diagnosis not present

## 2023-10-27 DIAGNOSIS — O358XX0 Maternal care for other (suspected) fetal abnormality and damage, not applicable or unspecified: Secondary | ICD-10-CM | POA: Diagnosis not present

## 2023-10-27 NOTE — Progress Notes (Signed)
  Because of your symptoms and medical history, I feel your condition warrants further evaluation and I recommend that you be seen in a face-to-face visit.   NOTE: There will be NO CHARGE for this E-Visit   If you are having a true medical emergency, please call 911.     For an urgent face to face visit, [place] has multiple urgent care centers for your convenience.  Click the link below for the full list of locations and hours, walk-in wait times, appointment scheduling options and driving directions:  Urgent Care - [place], [place], [place], [place], [place], [place]  [place]     Your MyChart E-visit questionnaire answers were reviewed by a board certified advanced clinical practitioner to complete your personal care plan based on your specific symptoms.    Thank you for using e-Visits.

## 2023-10-28 ENCOUNTER — Other Ambulatory Visit: Payer: Self-pay

## 2023-10-28 DIAGNOSIS — Z3A29 29 weeks gestation of pregnancy: Secondary | ICD-10-CM | POA: Diagnosis not present

## 2023-10-28 DIAGNOSIS — H7291 Unspecified perforation of tympanic membrane, right ear: Secondary | ICD-10-CM | POA: Diagnosis not present

## 2023-10-28 DIAGNOSIS — H6691 Otitis media, unspecified, right ear: Secondary | ICD-10-CM | POA: Diagnosis not present

## 2023-10-28 MED ORDER — CEFDINIR 300 MG PO CAPS
300.0000 mg | ORAL_CAPSULE | Freq: Two times a day (BID) | ORAL | 0 refills | Status: DC
  Filled 2023-10-28: qty 20, 10d supply, fill #0

## 2023-10-31 DIAGNOSIS — H60331 Swimmer's ear, right ear: Secondary | ICD-10-CM | POA: Diagnosis not present

## 2023-11-06 DIAGNOSIS — O358XX0 Maternal care for other (suspected) fetal abnormality and damage, not applicable or unspecified: Secondary | ICD-10-CM | POA: Diagnosis not present

## 2023-11-06 DIAGNOSIS — O35HXX0 Maternal care for other (suspected) fetal abnormality and damage, fetal lower extremities anomalies, not applicable or unspecified: Secondary | ICD-10-CM | POA: Diagnosis not present

## 2023-11-06 DIAGNOSIS — Z3A30 30 weeks gestation of pregnancy: Secondary | ICD-10-CM | POA: Diagnosis not present

## 2023-11-06 DIAGNOSIS — Z3A25 25 weeks gestation of pregnancy: Secondary | ICD-10-CM | POA: Diagnosis not present

## 2023-11-27 DIAGNOSIS — O368130 Decreased fetal movements, third trimester, not applicable or unspecified: Secondary | ICD-10-CM | POA: Diagnosis not present

## 2023-11-27 DIAGNOSIS — Z3A33 33 weeks gestation of pregnancy: Secondary | ICD-10-CM | POA: Diagnosis not present

## 2023-12-01 ENCOUNTER — Ambulatory Visit

## 2023-12-08 ENCOUNTER — Other Ambulatory Visit: Payer: Self-pay

## 2023-12-08 ENCOUNTER — Ambulatory Visit: Attending: Obstetrics and Gynecology

## 2023-12-08 DIAGNOSIS — M6281 Muscle weakness (generalized): Secondary | ICD-10-CM | POA: Diagnosis not present

## 2023-12-08 DIAGNOSIS — R2689 Other abnormalities of gait and mobility: Secondary | ICD-10-CM | POA: Insufficient documentation

## 2023-12-08 DIAGNOSIS — O26899 Other specified pregnancy related conditions, unspecified trimester: Secondary | ICD-10-CM | POA: Diagnosis not present

## 2023-12-08 DIAGNOSIS — N3946 Mixed incontinence: Secondary | ICD-10-CM | POA: Insufficient documentation

## 2023-12-08 DIAGNOSIS — R102 Pelvic and perineal pain: Secondary | ICD-10-CM | POA: Diagnosis not present

## 2023-12-08 NOTE — Therapy (Signed)
 OUTPATIENT PHYSICAL THERAPY FEMALE PELVIC EVALUATION   Patient Name: Susan  Keely Hoffman MRN: [id number] DOB:01-11-1991, 33 y.o., female Today's Date: 12/08/2023  END OF SESSION:  PT End of Session - 12/08/23 1409     Visit Number 1    Number of Visits 9    Date for PT Re-Evaluation 02/06/24    Authorization Type [employer] and [place]    Progress Note Due on Visit 10    PT Start Time 1402    PT Stop Time 1449    PT Time Calculation (min) 47 min    Activity Tolerance Patient tolerated treatment well    Behavior During Therapy [facility] for tasks assessed/performed          Past Medical History:  Diagnosis Date   Abnormal cells of cervix 2016   Was told needs PAP every 6 months due to abnormal cells.    Acquired deafness of right ear 2015   ADHD    Past Surgical History:  Procedure Laterality Date   nexplanon   2017   TYMPANOPLASTY     wisdom teeth removal     Patient Active Problem List   Diagnosis Date Noted   Supervision of other normal pregnancy, antepartum 06/03/2023   Ankle pain, right 10/11/2019   Abnormal Pap smear of cervix 02/13/2017   Attention deficit hyperactivity disorder (ADHD), predominantly hyperactive type 02/13/2017   Nummular dermatitis 02/13/2017   Deafness in right ear 02/13/2017   Migraine headache with aura 01/07/2012    PCP: Leita Nani, MD  REFERRING PROVIDER: Mardy Holt, NP  REFERRING DIAG: pelvic pressure   THERAPY DIAG:  Pelvic pressure in pregnancy - Plan: PT plan of care cert/re-cert  Other abnormalities of gait and mobility - Plan: PT plan of care cert/re-cert  Muscle weakness (generalized) - Plan: PT plan of care cert/re-cert  Pelvic pain - Plan: PT plan of care cert/re-cert  Mixed incontinence - Plan: PT plan of care cert/re-cert  Rationale for Evaluation and Treatment: Rehabilitation  ONSET DATE: 11/30/23  SUBJECTIVE:                                                                                                                                                                                            SUBJECTIVE STATEMENT: URINARY FUNCTION: pt is voiding approx. Once an hour this past week, no pain with urination. Unsure if the stream is strong or weak right now. Pt has been experiencing mixed urinary incontinence this last week. Pt denied chronic UTIs, yeast infections, or kidney infections. She has to take a lot of sodium 2/2 low BP. Pt gets up every 1.5-2  hours during pregnancy (at night).  BOWEL FUNCTION: Pt has one BM a day. Pt has some bleeding after wiping at work but not at home. Pt uses a squatty potty at home.  SEXUAL FUNCTION: Pt denied pain with intercourse right now. Pt denied pain with tampon insertion. 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 Hx of abnormal periods when younger and then infertility treatments. Has hx of hormone fluctuations.  CORE STABILITY: Feels like core was very strong before pregnancy, but now has trouble activating deep core right now. She feels like a tearing sensation on R lower abs/groin and LBP. She has a SIJ belt and a pelvic/belly support belt. When she stands up she feels so much pressure and nobody has told pt she has prolapse. Pressure is worse with standing, squatting, walking (feels sheering sensation), sit to stand txfs, feels grinding on stairs, getting OOB is not comfortable (feels like core is tearing or severe LBP).  Pt sits on the physioball at work and takes a walk every few hours.   Fluid intake: [place] (drinks 3-4/day), coffee (half cup of coffee), sometimes half cup late in the day, bubbly water, glass of lemonade  PAIN:  Are you having pain? Yes NPRS scale: 2/10 Pain location: LBP  Pain type:  mild migraine (HA since second trimester)  Pain description: HA   Aggravating factors: unsure Relieving factors: unsure  At worst: 8/10 LBP and pelvic pain,  then repositions and can move again, sharp pain and then achy. Pretty constant.   PRECAUTIONS: None  RED FLAGS: None   WEIGHT BEARING RESTRICTIONS: No  FALLS:  Has patient fallen in last 6 months? No  OCCUPATION: physical therapist--light duty   ACTIVITY LEVEL : walks every day on the weekends, at least a mile. Mobility 20 minutes three times a week prior week.   PLOF: Independent  PATIENT GOALS: To not pee myself and anything to help with labor and to reduce risk of tearing.   PERTINENT HISTORY:  Migraine, deafness in R ear, miscarriage 2024 Sexual abuse: No  BOWEL MOVEMENT: Pain with bowel movement: No Type of bowel movement:Frequency daily Fully empty rectum: Yes:   Leakage: No Pads: No Fiber supplement/laxative No  URINATION: Pain with urination: No Fully empty bladder: No Stream: not as strong right now Urgency: Yes  Frequency: every hour Leakage: Urge to void, Walking to the bathroom, Coughing, Sneezing, Laughing, and Exercise Pads: Yes: period underwear, pantyliner about 4x/day  INTERCOURSE:  Ability to have vaginal penetration Yes  Pain with intercourse: none DrynessNo Climax: yes Marinoff Scale: 0/3   PREGNANCY: Number of pregnancies: 2  Vaginal deliveries: 0  C-section deliveries 0 Currently pregnant Yes: 35 weeks tomorrow   PROLAPSE: Pressure but not diagnosed withprolapse.    OBJECTIVE:  Note: Objective measures were completed at Evaluation unless otherwise noted.   COGNITION: Overall cognitive status: Within functional limits for tasks assessed     SENSATION: Light touch: Appears intact   GAIT: Assistive device utilized: None Comments: incr. Wide BOS and decr. Trunk rotation  POSTURE: increased lumbar lordosis and anterior pelvic tilt   LUMBARAROM/PROM:  A/PROM A/PROM  eval  Flexion   Extension   Right lateral flexion   Left lateral flexion   Right rotation   Left rotation    (Blank rows = not tested)  LOWER  EXTREMITY ROM:  Active ROM Right eval Left eval  Hip flexion    Hip extension  Hip abduction    Hip adduction    Hip internal rotation    Hip external rotation    Knee flexion    Knee extension    Ankle dorsiflexion    Ankle plantarflexion    Ankle inversion    Ankle eversion     (Blank rows = not tested)  LOWER EXTREMITY MMT:  MMT Right eval Left eval  Hip flexion    Hip extension    Hip abduction    Hip adduction    Hip internal rotation    Hip external rotation    Knee flexion    Knee extension    Ankle dorsiflexion    Ankle plantarflexion    Ankle inversion    Ankle eversion     (Blank rows = not tested) PALPATION:   General: no TTP over cx, tx, or lx spine in standing    PELVIC MMT:   MMT eval  Vaginal   Internal Anal Sphincter   External Anal Sphincter   Puborectalis   Diastasis Recti   (Blank rows = not tested)        TONE: limited by time constraints   PROLAPSE: pressure  TODAY'S TREATMENT:                                                                                                                              DATE: 12/08/23  EVAL   SELF CARE:  PT provided pt with pregnancy pain relief mobility exercises:  Side-Lying Mid Back Rotations 2-3 x 5 Breaths  Hands and Knees Mid Back  Rotations 2-3 x 5 Breaths  Paraspinal Release 2-3 x 5 Breaths  Iliacus Pullbacks 2 x 5-10/side Glute Adductor Squeeze 2 x 5-10/side  Hands and Knees Breathing 2-3 x 5 breaths Deadbugs 2-3 x 5-10/leg (modify to bird dog) Sidelying diaphragmatic breathing with pillow b/t knees x 5 reps.  PATIENT EDUCATION:  Education details: PT educated pt on main functions of the pelvic floor, IAP, breath and PFM relationship. PT discussed POC, frequency and duration. PT provided the following education: TOILET POSTURE: Urination: feet flat, lean forward with forearms on legs to fully empty bladder. Bowel movement: place feet flat on Squatty Potty or stool so knees are  higher than hips, lean forward to relax pelvic floor in order to avoid strain.  SHOES: wear supportive shoes, and sandals with straps.  POSTURE: try not to cross legs at knees or ankles. Try the figure four stretch instead.  WATER: start with water first thing in the morning.   PELVIC TILTS: try to stand in neutral, not tucking your tail and not arching back, but in the middle.  Person educated: Patient Education method: Explanation, Demonstration, and Handouts Education comprehension: verbalized understanding and needs further education  HOME EXERCISE PROGRAM: See above.  ASSESSMENT:  CLINICAL IMPRESSION: Patient is a pleasant 33 y.o. female who was seen today for physical therapy evaluation and treatment for pelvic pain and pressure and mixed  urinary incontinence.  Pt's PMH is significant for the following: Migraine, deafness in R ear, miscarriage 2024. The following impairments were noted upon exam: limited trunk rot ROM, back/pelvic pain, postural dysfunction, decr. Strength likely 2/2 subjective reports and gait deviations, nocturia, mixed UI. Pt would benefit from skilled PT to improve safety and decr. Pain during all ADLs.   OBJECTIVE IMPAIRMENTS: Abnormal gait, decreased coordination, decreased ROM, decreased strength, hypomobility, increased fascial restrictions, and pain.   ACTIVITY LIMITATIONS: carrying, lifting, bending, standing, squatting, sleeping, stairs, transfers, bed mobility, continence, toileting, locomotion level, and caring for others  PARTICIPATION LIMITATIONS: meal prep, cleaning, laundry, interpersonal relationship, community activity, and occupation  PERSONAL FACTORS: 3+ comorbidities: see above are also affecting patient's functional outcome.   REHAB POTENTIAL: Excellent  CLINICAL DECISION MAKING: Stable/uncomplicated  EVALUATION COMPLEXITY: Low   GOALS: Goals reviewed with patient? Yes  SHORT TERM GOALS: Target date: for all STGs: 01/05/24  Pt  will be IND in HEP to improve pain, strength, coordination. Baseline: walking Goal status: INITIAL  2.  Finish exam and write goals as indicated. Baseline: limited by time constraints Goal status: INITIAL  3.  Pt will demonstrate improved bladder behaviors and improved coordination of PFM with breath to decr. Nocturia for </=2/night. Baseline: every 1.5-2 hours during third trimester Goal status: INITIAL  4.  Pt will demo proper toileting posture to fully empty bladder. Baseline: not performing this posture. Goal status: INITIAL  Goal status: INITIAL  LONG TERM GOALS: Target date: for all LTGs: 02/05/24  Pt will be able to verbalize and demonstrate labor and delivery techniques to reduce strain in order to have a positive birthing experience. Baseline: unable to verbalize/demo Goal status: INITIAL  2.  Pt will demonstrated improved relaxation and contraction of PFM with coordination of breath to reduce urinary leakage to </=once/week. Baseline: daily wearing up to 4 pantyliners or period panties/day. Goal status: INITIAL   PLAN: complete exam (MMT, ROM, palpation), review ed and exercises and provided strengthening HEP.  PT FREQUENCY: 1x/week  PT DURATION: 8 weeks  PLANNED INTERVENTIONS: 97164- PT Re-evaluation, 97110-Therapeutic exercises, 97530- Therapeutic activity, 97112- Neuromuscular re-education, 97535- Self Care, 02859- Manual therapy, [phone]- Gait training, [phone] (1-2 muscles), 20561 (3+ muscles)- Dry Needling, Patient/Family education, Stair training, Taping, Joint mobilization, Spinal mobilization, Moist heat, and Biofeedback   Janelie Goltz L, PT 12/08/2023, 3:35 PM  Delon Pinal, PT,DPT 12/08/23 3:35 PM Phone: [phone] Fax: [phone]

## 2023-12-09 DIAGNOSIS — R0602 Shortness of breath: Secondary | ICD-10-CM | POA: Diagnosis not present

## 2023-12-09 DIAGNOSIS — I498 Other specified cardiac arrhythmias: Secondary | ICD-10-CM | POA: Diagnosis not present

## 2023-12-09 DIAGNOSIS — Z3A35 35 weeks gestation of pregnancy: Secondary | ICD-10-CM | POA: Diagnosis not present

## 2023-12-09 DIAGNOSIS — O26893 Other specified pregnancy related conditions, third trimester: Secondary | ICD-10-CM | POA: Diagnosis not present

## 2023-12-11 DIAGNOSIS — Z3A35 35 weeks gestation of pregnancy: Secondary | ICD-10-CM | POA: Diagnosis not present

## 2023-12-11 DIAGNOSIS — O358XX0 Maternal care for other (suspected) fetal abnormality and damage, not applicable or unspecified: Secondary | ICD-10-CM | POA: Diagnosis not present

## 2023-12-15 ENCOUNTER — Ambulatory Visit

## 2023-12-15 ENCOUNTER — Encounter: Payer: Self-pay | Admitting: Internal Medicine

## 2023-12-15 ENCOUNTER — Other Ambulatory Visit: Payer: Self-pay | Admitting: Internal Medicine

## 2023-12-15 ENCOUNTER — Other Ambulatory Visit: Payer: Self-pay

## 2023-12-15 DIAGNOSIS — M6281 Muscle weakness (generalized): Secondary | ICD-10-CM

## 2023-12-15 DIAGNOSIS — O26899 Other specified pregnancy related conditions, unspecified trimester: Secondary | ICD-10-CM

## 2023-12-15 DIAGNOSIS — R2689 Other abnormalities of gait and mobility: Secondary | ICD-10-CM | POA: Diagnosis not present

## 2023-12-15 DIAGNOSIS — R102 Pelvic and perineal pain: Secondary | ICD-10-CM | POA: Diagnosis not present

## 2023-12-15 DIAGNOSIS — N3946 Mixed incontinence: Secondary | ICD-10-CM | POA: Diagnosis not present

## 2023-12-15 NOTE — Therapy (Signed)
 OUTPATIENT PHYSICAL THERAPY FEMALE PELVIC TREATMENT   Patient Name: Susan  Sanai Hoffman MRN: [id number] DOB:Jul 27, 1990, 33 y.o., female Today's Date: 12/15/2023  END OF SESSION:  PT End of Session - 12/15/23 1108     Visit Number 2    Number of Visits 9    Date for PT Re-Evaluation 02/06/24    Authorization Type [employer] and [place]    Progress Note Due on Visit 10    PT Start Time 1104    PT Stop Time 1145    PT Time Calculation (min) 41 min    Activity Tolerance Patient tolerated treatment well    Behavior During Therapy [facility] for tasks assessed/performed          Past Medical History:  Diagnosis Date   Abnormal cells of cervix 2016   Was told needs PAP every 6 months due to abnormal cells.    Acquired deafness of right ear 2015   ADHD    Past Surgical History:  Procedure Laterality Date   nexplanon   2017   TYMPANOPLASTY     wisdom teeth removal     Patient Active Problem List   Diagnosis Date Noted   Supervision of other normal pregnancy, antepartum 06/03/2023   Ankle pain, right 10/11/2019   Abnormal Pap smear of cervix 02/13/2017   Attention deficit hyperactivity disorder (ADHD), predominantly hyperactive type 02/13/2017   Nummular dermatitis 02/13/2017   Deafness in right ear 02/13/2017   Migraine headache with aura 01/07/2012    PCP: Leita Nani, MD  REFERRING PROVIDER: Mardy Holt, NP  REFERRING DIAG: pelvic pressure   THERAPY DIAG:  Pelvic pressure in pregnancy  Other abnormalities of gait and mobility  Muscle weakness (generalized)  Pelvic pain  Mixed incontinence  Rationale for Evaluation and Treatment: Rehabilitation  ONSET DATE: 11/30/23  SUBJECTIVE:                                                                                                                                                                                           SUBJECTIVE STATEMENT: Pt might be going out of work 2/2 swelling,  vision changes, low BP. She has been performing exercises and two episodes of incontinence since last visit. Pubic symphysis pain is increasing.       PAIN:  Are you having pain? Yes NPRS scale: 0/10 Pain location:    From eval: Pain type:  mild migraine (HA since second trimester)  Pain description: HA   Aggravating factors: unsure Relieving factors: unsure  At worst: 8/10 LBP and pelvic pain, then repositions and can move again, sharp pain and then achy. Pretty constant.  PRECAUTIONS: None  RED FLAGS: None   WEIGHT BEARING RESTRICTIONS: No  FALLS:  Has patient fallen in last 6 months? No  OCCUPATION: physical therapist--light duty   ACTIVITY LEVEL : walks every day on the weekends, at least a mile. Mobility 20 minutes three times a week prior week.   PLOF: Independent  PATIENT GOALS: To not pee myself and anything to help with labor and to reduce risk of tearing.   PERTINENT HISTORY:  Migraine, deafness in R ear, miscarriage 2024 Sexual abuse: No  BOWEL MOVEMENT: Pain with bowel movement: No Type of bowel movement:Frequency daily Fully empty rectum: Yes:   Leakage: No Pads: No Fiber supplement/laxative No  URINATION: Pain with urination: No Fully empty bladder: No Stream: not as strong right now Urgency: Yes  Frequency: every hour Leakage: Urge to void, Walking to the bathroom, Coughing, Sneezing, Laughing, and Exercise Pads: Yes: period underwear, pantyliner about 4x/day  INTERCOURSE:  Ability to have vaginal penetration Yes  Pain with intercourse: none DrynessNo Climax: yes Marinoff Scale: 0/3   PREGNANCY: Number of pregnancies: 2  Vaginal deliveries: 0  C-section deliveries 0 Currently pregnant Yes: 35 weeks tomorrow   PROLAPSE: Pressure but not diagnosed withprolapse.    OBJECTIVE:  Note: Objective measures were completed at Evaluation unless otherwise noted.   COGNITION: Overall cognitive status: Within functional limits for  tasks assessed     SENSATION: Light touch: Appears intact   GAIT: Assistive device utilized: None Comments: incr. Wide BOS and decr. Trunk rotation  POSTURE: increased lumbar lordosis and anterior pelvic tilt   LUMBARAROM/PROM: all WNL except for rotation.  A/PROM A/PROM  eval  Flexion   Extension   Right lateral flexion   Left lateral flexion   Right rotation   Left rotation    (Blank rows = not tested)  LOWER EXTREMITY ROM: all WNL except for B hip ER/IR.  Active ROM Right eval Left eval  Hip flexion    Hip extension    Hip abduction    Hip adduction    Hip internal rotation    Hip external rotation    Knee flexion    Knee extension    Ankle dorsiflexion    Ankle plantarflexion    Ankle inversion    Ankle eversion     (Blank rows = not tested)  LOWER EXTREMITY MMT:  MMT Right eval Left eval  Hip flexion 4+ 4+  Hip extension    Hip abduction 3+ 3+  Hip adduction 3+ 3+  Hip internal rotation 4 4  Hip external rotation 4 4  Knee flexion 4 4  Knee extension 4+ 4+  Ankle dorsiflexion 5 5  Ankle plantarflexion    Ankle inversion    Ankle eversion     (Blank rows = not tested) PALPATION:   General: no TTP over cx, tx, or lx spine in standing. Difficulty coordinating breath with PFM contraction and relaxation without utilizing glutes and abs.     PELVIC MMT:   MMT eval  Vaginal   Internal Anal Sphincter   External Anal Sphincter   Puborectalis   Diastasis Recti   (Blank rows = not tested)        TONE: limited by time constraints   PROLAPSE: pressure  TODAY'S TREATMENT:  DATE: 12/15/23   Physical performance test: completed eval assessment (MMT, ROM, palpation) see above.  NMR: Access Code: [email or web address] URL: [email or web address] Date: 12/15/2023 Prepared by: Delon Pinal  Exercises - Side  Stepping with Resistance at Thighs  - 1 x daily - 7 x weekly - 1 sets - 4-6 reps at counter. - Lateral Lunge with Slider  - 1 x daily - 7 x weekly - 1 sets - 10 reps at counter.  -90/90 pullbacks vs. Sidelying pullbacks as pt had difficulty performing in sidelying.  Cues and demo for proper technique. S for safety. No pain reported.   SELF CARE: Birthing positions: sidelying, standing, quadruped. Along with ways to reduce pressure on abdomen and hips.  PATIENT EDUCATION:  Education details: PT reviewed exam findings, birthing positions, pain relief strategies for L&D and currently, PT established HEP. PT also sent breastfeeding posture guide, PP recovery guide. Person educated: Patient Education method: Explanation, Demonstration, and Handouts Education comprehension: verbalized understanding and needs further education  HOME EXERCISE PROGRAM: [email or web address] and sent guides and pullbacks via text.  ASSESSMENT:  CLINICAL IMPRESSION: Skilled session focused on completing exam and establishing HEP and education on birthing positions and pain relief.  Pt's PMH is significant for the following: Migraine, deafness in R ear, miscarriage 2024. Pt demonstrated progress as she's only experienced two leakage episodes since last session. The following impairments were noted upon exam: limited trunk rot ROM, back/pelvic pain, postural dysfunction, decr. Strength (hips), nocturia, mixed UI. Pt would continue ot benefit from skilled PT to improve safety and decr. Pain during all ADLs.   OBJECTIVE IMPAIRMENTS: Abnormal gait, decreased coordination, decreased ROM, decreased strength, hypomobility, increased fascial restrictions, and pain.   ACTIVITY LIMITATIONS: carrying, lifting, bending, standing, squatting, sleeping, stairs, transfers, bed mobility, continence, toileting, locomotion level, and caring for others  PARTICIPATION LIMITATIONS: meal prep, cleaning, laundry, interpersonal relationship, community  activity, and occupation  PERSONAL FACTORS: 3+ comorbidities: see above are also affecting patient's functional outcome.   REHAB POTENTIAL: Excellent  CLINICAL DECISION MAKING: Stable/uncomplicated  EVALUATION COMPLEXITY: Low   GOALS: Goals reviewed with patient? Yes  SHORT TERM GOALS: Target date: for all STGs: 01/05/24  Pt will be IND in HEP to improve pain, strength, coordination. Baseline: walking Goal status: INITIAL  2.  Finish exam and write goals as indicated. Baseline: limited by time constraints Goal status: MET  3.  Pt will demonstrate improved bladder behaviors and improved coordination of PFM with breath to decr. Nocturia for </=2/night. Baseline: every 1.5-2 hours during third trimester Goal status: INITIAL  4.  Pt will demo proper toileting posture to fully empty bladder. Baseline: not performing this posture. Goal status: INITIAL  Goal status: INITIAL  LONG TERM GOALS: Target date: for all LTGs: 02/05/24  Pt will be able to verbalize and demonstrate labor and delivery techniques to reduce strain in order to have a positive birthing experience. Baseline: unable to verbalize/demo Goal status: INITIAL  2.  Pt will demonstrated improved relaxation and contraction of PFM with coordination of breath to reduce urinary leakage to </=once/week. Baseline: daily wearing up to 4 pantyliners or period panties/day. Goal status: INITIAL   PLAN: review ed and exercises HEP.  PT FREQUENCY: 1x/week  PT DURATION: 8 weeks  PLANNED INTERVENTIONS: 97164- PT Re-evaluation, 97110-Therapeutic exercises, 97530- Therapeutic activity, 97112- Neuromuscular re-education, 97535- Self Care, 02859- Manual therapy, [phone]- Gait training, [phone] (1-2 muscles), 20561 (3+ muscles)- Dry Needling, Patient/Family education, Stair training, Taping, Joint mobilization, Spinal mobilization, Moist heat, and Biofeedback   Debroh Sieloff  L, PT 12/15/2023, 11:08 AM  Delon Pinal,  PT,DPT 12/15/23 11:08 AM Phone: [phone] Fax: [phone]

## 2023-12-15 NOTE — Progress Notes (Unsigned)
 Date:  12/15/2023   Name:  Susan  Micheline Hoffman   DOB:  1990/11/28   MRN:  [id number]   Chief Complaint: No chief complaint on file.  HPI  Review of Systems   Lab Results  Component Value Date   NA 140 08/08/2022   K 5.2 08/08/2022   CO2 23 08/08/2022   GLUCOSE 86 08/08/2022   BUN 9 08/08/2022   CREATININE 0.90 08/08/2022   CALCIUM 9.8 08/08/2022   EGFR 88 08/08/2022   GFRNONAA 79 06/27/2019   Lab Results  Component Value Date   CHOL 173 08/08/2022   HDL 64 08/08/2022   LDLCALC 96 08/08/2022   TRIG 65 08/08/2022   CHOLHDL 2.7 08/08/2022   Lab Results  Component Value Date   TSH 0.891 08/08/2022   Lab Results  Component Value Date   HGBA1C 5.1 08/08/2022   Lab Results  Component Value Date   WBC 7.0 08/08/2022   HGB 14.4 08/08/2022   HCT 43.3 08/08/2022   MCV 94 08/08/2022   PLT 321 08/08/2022   Lab Results  Component Value Date   ALT 11 08/08/2022   AST 15 08/08/2022   ALKPHOS 73 08/08/2022   BILITOT 0.7 08/08/2022   No results found for: MARIEN BOLLS, VD25OH   Patient Active Problem List   Diagnosis Date Noted   Supervision of other normal pregnancy, antepartum 06/03/2023   Ankle pain, right 10/11/2019   Abnormal Pap smear of cervix 02/13/2017   Attention deficit hyperactivity disorder (ADHD), predominantly hyperactive type 02/13/2017   Nummular dermatitis 02/13/2017   Deafness in right ear 02/13/2017   Migraine headache with aura 01/07/2012    Allergies  Allergen Reactions   Covid-19 (Mrna) Vaccine Anaphylaxis, Other (See Comments) and Shortness Of Breath   Cefdinir  Diarrhea   Codeine    Mushroom Extract Complex (Obsolete)     Past Surgical History:  Procedure Laterality Date   nexplanon   2017   TYMPANOPLASTY     wisdom teeth removal      Social History   Tobacco Use   Smoking status: Never   Smokeless tobacco: Never  Vaping Use   Vaping status: Never Used  Substance Use Topics   Alcohol use: Not Currently     Alcohol/week: 0.0 - 1.0 standard drinks of alcohol    Comment: 1 glass wine every 2 weeks   Drug use: No     Medication list has been reviewed and updated.  No outpatient medications have been marked as taking for the 12/15/23 encounter (Orders Only) with Justus Leita DEL, MD.       08/08/2022    8:14 AM 07/03/2021    8:09 AM 06/28/2020    7:51 AM 06/12/2020    4:03 PM  GAD 7 : Generalized Anxiety Score  Nervous, Anxious, on Edge 0 0 0 0  Control/stop worrying 0 0 0 0  Worry too much - different things 0 0 0 0  Trouble relaxing 0 0 0 0  Restless 0 0 0 0  Easily annoyed or irritable 0 0 0 0  Afraid - awful might happen 0 0 0 0  Total GAD 7 Score 0 0 0 0  Anxiety Difficulty Not difficult at all Not difficult at all  Not difficult at all       06/03/2023    1:20 PM 08/08/2022    8:14 AM 07/03/2021    8:09 AM  Depression screen PHQ 2/9  Decreased Interest 0 0 0  Down, Depressed,  Hopeless 0 0 0  PHQ - 2 Score 0 0 0  Altered sleeping  0 0  Tired, decreased energy  2 2  Change in appetite  0 0  Feeling bad or failure about yourself   0 0  Trouble concentrating  0 0  Moving slowly or fidgety/restless  0 0  Suicidal thoughts  0 0  PHQ-9 Score  2 2  Difficult doing work/chores  Not difficult at all Not difficult at all    BP Readings from Last 3 Encounters:  08/08/22 122/78  07/03/21 122/78  03/15/21 (!) 116/52    Physical Exam  Wt Readings from Last 3 Encounters:  06/03/23 150 lb (68 kg)  08/08/22 151 lb (68.5 kg)  07/03/21 162 lb 3.2 oz (73.6 kg)    LMP 04/08/2023   Assessment and Plan:  Problem List Items Addressed This Visit   None   No follow-ups on file.    Leita HILARIO Adie, MD [facility] [place]

## 2023-12-16 ENCOUNTER — Other Ambulatory Visit: Payer: Self-pay

## 2023-12-16 ENCOUNTER — Encounter: Payer: Self-pay | Admitting: Internal Medicine

## 2023-12-16 ENCOUNTER — Ambulatory Visit: Admitting: Internal Medicine

## 2023-12-16 VITALS — BP 128/76 | HR 83 | Ht 62.0 in | Wt 198.0 lb

## 2023-12-16 DIAGNOSIS — H6691 Otitis media, unspecified, right ear: Secondary | ICD-10-CM | POA: Diagnosis not present

## 2023-12-16 MED ORDER — CIPROFLOXACIN-DEXAMETHASONE 0.3-0.1 % OT SUSP
4.0000 [drp] | Freq: Two times a day (BID) | OTIC | 0 refills | Status: DC
  Filled 2023-12-16: qty 7.5, 19d supply, fill #0

## 2023-12-16 NOTE — Progress Notes (Signed)
 Date:  12/16/2023   Name:  Susan  Sunjai Hoffman   DOB:  11-12-1990   MRN:  [id number]   Chief Complaint: Otalgia (Right Ear Pain. Wants refill on ear drops from [facility].) She is [redacted] weeks pregnant.  Otalgia  There is pain in the right ear. This is a recurrent problem. The current episode started yesterday. The problem occurs constantly. The problem has been gradually worsening. There has been no fever. Associated symptoms include ear discharge. Pertinent negatives include no sore throat. Treatments tried: treated about 6 weeks ago for OM with Ciprodex . The treatment provided significant (but now symptoms have recurred) relief.    Review of Systems  Constitutional:  Negative for chills, fatigue and fever.  HENT:  Positive for ear discharge and ear pain. Negative for sinus pressure and sore throat.   Respiratory:  Negative for chest tightness and shortness of breath.   Cardiovascular:  Positive for leg swelling (from pregnancy). Negative for chest pain and palpitations.  Neurological:  Negative for dizziness.     Lab Results  Component Value Date   NA 140 08/08/2022   K 5.2 08/08/2022   CO2 23 08/08/2022   GLUCOSE 86 08/08/2022   BUN 9 08/08/2022   CREATININE 0.90 08/08/2022   CALCIUM 9.8 08/08/2022   EGFR 88 08/08/2022   GFRNONAA 79 06/27/2019   Lab Results  Component Value Date   CHOL 173 08/08/2022   HDL 64 08/08/2022   LDLCALC 96 08/08/2022   TRIG 65 08/08/2022   CHOLHDL 2.7 08/08/2022   Lab Results  Component Value Date   TSH 0.891 08/08/2022   Lab Results  Component Value Date   HGBA1C 5.1 08/08/2022   Lab Results  Component Value Date   WBC 7.0 08/08/2022   HGB 14.4 08/08/2022   HCT 43.3 08/08/2022   MCV 94 08/08/2022   PLT 321 08/08/2022   Lab Results  Component Value Date   ALT 11 08/08/2022   AST 15 08/08/2022   ALKPHOS 73 08/08/2022   BILITOT 0.7 08/08/2022   No results found for: MARIEN BOLLS, VD25OH   Patient Active Problem List    Diagnosis Date Noted   Supervision of other normal pregnancy, antepartum 06/03/2023   Ankle pain, right 10/11/2019   Abnormal Pap smear of cervix 02/13/2017   Attention deficit hyperactivity disorder (ADHD), predominantly hyperactive type 02/13/2017   Nummular dermatitis 02/13/2017   Deafness in right ear 02/13/2017   Migraine headache with aura 01/07/2012    Allergies  Allergen Reactions   Covid-19 (Mrna) Vaccine Anaphylaxis, Other (See Comments) and Shortness Of Breath   Cefdinir  Diarrhea   Codeine    Mushroom Extract Complex (Obsolete)     Past Surgical History:  Procedure Laterality Date   nexplanon   2017   TYMPANOPLASTY     wisdom teeth removal      Social History   Tobacco Use   Smoking status: Never   Smokeless tobacco: Never  Vaping Use   Vaping status: Never Used  Substance Use Topics   Alcohol use: Not Currently    Alcohol/week: 0.0 - 1.0 standard drinks of alcohol    Comment: 1 glass wine every 2 weeks   Drug use: No     Medication list has been reviewed and updated.  Current Meds  Medication Sig   albuterol  (VENTOLIN  HFA) 108 (90 Base) MCG/ACT inhaler Inhale 2 puffs into the lungs every 6 (six) hours as needed for wheezing or shortness of breath.   cefdinir  (OMNICEF ) 300  MG capsule Take 1 capsule (300 mg total) by mouth 2 (two) times daily for 10 days   ciprofloxacin -dexamethasone  (CIPRODEX ) OTIC suspension Place 4 drops into the right ear 2 (two) times daily.   Ferrous Sulfate (IRON PO) Take 1 tablet by mouth daily.   ferrous sulfate 325 (65 FE) MG EC tablet Take by mouth.   Magnesium 400 MG CAPS    Melatonin 5 MG CAPS Take 5 mg by mouth at bedtime.   Prenatal MV-Min-Fe Fum-FA-DHA (PRENATAL 1 PO)    Riboflavin 400 MG CAPS    [DISCONTINUED] Ferrous Sulfate (IRON PO) Take by mouth.   [DISCONTINUED] metoCLOPramide  (REGLAN ) 10 MG/10ML SOLN        12/16/2023    1:27 PM 08/08/2022    8:14 AM 07/03/2021    8:09 AM 06/28/2020    7:51 AM  GAD 7 :  Generalized Anxiety Score  Nervous, Anxious, on Edge 0 0 0 0  Control/stop worrying 0 0 0 0  Worry too much - different things 0 0 0 0  Trouble relaxing 0 0 0 0  Restless 0 0 0 0  Easily annoyed or irritable 0 0 0 0  Afraid - awful might happen 0 0 0 0  Total GAD 7 Score 0 0 0 0  Anxiety Difficulty Not difficult at all Not difficult at all Not difficult at all        12/16/2023    1:27 PM 06/03/2023    1:20 PM 08/08/2022    8:14 AM  Depression screen PHQ 2/9  Decreased Interest 0 0 0  Down, Depressed, Hopeless 0 0 0  PHQ - 2 Score 0 0 0  Altered sleeping 0  0  Tired, decreased energy 0  2  Change in appetite 0  0  Feeling bad or failure about yourself  0  0  Trouble concentrating 0  0  Moving slowly or fidgety/restless 0  0  Suicidal thoughts 0  0  PHQ-9 Score 0  2  Difficult doing work/chores Not difficult at all  Not difficult at all    BP Readings from Last 3 Encounters:  12/16/23 128/76  08/08/22 122/78  07/03/21 122/78    Physical Exam Vitals and nursing note reviewed.  Constitutional:      General: She is not in acute distress.    Appearance: Normal appearance. She is well-developed.  HENT:     Head: Normocephalic and atraumatic.     Right Ear: Decreased hearing noted. Tympanic membrane is perforated and erythematous.     Left Ear: Hearing, tympanic membrane and ear canal normal.     Ears:     Comments: White debris in the ear canal Cardiovascular:     Rate and Rhythm: Normal rate and regular rhythm.     Heart sounds: No murmur heard. Pulmonary:     Effort: Pulmonary effort is normal. No respiratory distress.     Breath sounds: No wheezing or rhonchi.  Musculoskeletal:     Cervical back: Normal range of motion.     Right lower leg: Edema present.     Left lower leg: Edema present.  Lymphadenopathy:     Cervical: No cervical adenopathy.  Skin:    General: Skin is warm and dry.     Findings: No rash.  Neurological:     Mental Status: She is alert and  oriented to person, place, and time.  Psychiatric:        Mood and Affect: Mood normal.  Behavior: Behavior normal.     Wt Readings from Last 3 Encounters:  12/16/23 198 lb (89.8 kg)  06/03/23 150 lb (68 kg)  08/08/22 151 lb (68.5 kg)    BP 128/76   Pulse 83   Ht 5' 2 (1.575 m)   Wt 198 lb (89.8 kg)   LMP 04/08/2023   SpO2 97%   BMI 36.21 kg/m   Assessment and Plan:  Problem List Items Addressed This Visit   None Visit Diagnoses       OM (otitis media), recurrent, right    -  Primary   tylenol and warm compresses as needed   Relevant Medications   ciprofloxacin -dexamethasone  (CIPRODEX ) OTIC suspension       No follow-ups on file.    Leita HILARIO Adie, MD [facility] [place]

## 2023-12-17 DIAGNOSIS — Z3403 Encounter for supervision of normal first pregnancy, third trimester: Secondary | ICD-10-CM | POA: Diagnosis not present

## 2023-12-22 ENCOUNTER — Ambulatory Visit

## 2023-12-29 ENCOUNTER — Encounter

## 2024-01-10 DIAGNOSIS — Z881 Allergy status to other antibiotic agents status: Secondary | ICD-10-CM | POA: Diagnosis not present

## 2024-01-10 DIAGNOSIS — N9089 Other specified noninflammatory disorders of vulva and perineum: Secondary | ICD-10-CM | POA: Diagnosis not present

## 2024-01-10 DIAGNOSIS — O358XX0 Maternal care for other (suspected) fetal abnormality and damage, not applicable or unspecified: Secondary | ICD-10-CM | POA: Diagnosis not present

## 2024-01-10 DIAGNOSIS — Z887 Allergy status to serum and vaccine status: Secondary | ICD-10-CM | POA: Diagnosis not present

## 2024-01-10 DIAGNOSIS — O080 Genital tract and pelvic infection following ectopic and molar pregnancy: Secondary | ICD-10-CM | POA: Diagnosis not present

## 2024-01-10 DIAGNOSIS — Z3A39 39 weeks gestation of pregnancy: Secondary | ICD-10-CM | POA: Diagnosis not present

## 2024-01-10 DIAGNOSIS — D681 Hereditary factor XI deficiency: Secondary | ICD-10-CM | POA: Diagnosis not present

## 2024-01-10 DIAGNOSIS — O99892 Other specified diseases and conditions complicating childbirth: Secondary | ICD-10-CM | POA: Diagnosis not present

## 2024-01-10 DIAGNOSIS — O9912 Other diseases of the blood and blood-forming organs and certain disorders involving the immune mechanism complicating childbirth: Secondary | ICD-10-CM | POA: Diagnosis not present

## 2024-01-10 DIAGNOSIS — Z885 Allergy status to narcotic agent status: Secondary | ICD-10-CM | POA: Diagnosis not present

## 2024-01-11 DIAGNOSIS — O358XX0 Maternal care for other (suspected) fetal abnormality and damage, not applicable or unspecified: Secondary | ICD-10-CM | POA: Diagnosis not present

## 2024-01-11 DIAGNOSIS — Z3A39 39 weeks gestation of pregnancy: Secondary | ICD-10-CM | POA: Diagnosis not present

## 2024-01-14 DIAGNOSIS — M545 Low back pain, unspecified: Secondary | ICD-10-CM | POA: Diagnosis not present

## 2024-01-14 DIAGNOSIS — M5136 Other intervertebral disc degeneration, lumbar region with discogenic back pain only: Secondary | ICD-10-CM | POA: Diagnosis not present

## 2024-01-14 DIAGNOSIS — M51379 Other intervertebral disc degeneration, lumbosacral region without mention of lumbar back pain or lower extremity pain: Secondary | ICD-10-CM | POA: Diagnosis not present

## 2024-01-14 DIAGNOSIS — R29898 Other symptoms and signs involving the musculoskeletal system: Secondary | ICD-10-CM | POA: Diagnosis not present

## 2024-01-14 DIAGNOSIS — R29818 Other symptoms and signs involving the nervous system: Secondary | ICD-10-CM | POA: Diagnosis not present

## 2024-01-21 DIAGNOSIS — Z1332 Encounter for screening for maternal depression: Secondary | ICD-10-CM | POA: Diagnosis not present

## 2024-01-25 DIAGNOSIS — Z1332 Encounter for screening for maternal depression: Secondary | ICD-10-CM | POA: Diagnosis not present

## 2024-02-01 DIAGNOSIS — M6281 Muscle weakness (generalized): Secondary | ICD-10-CM | POA: Diagnosis not present

## 2024-02-04 DIAGNOSIS — Z1332 Encounter for screening for maternal depression: Secondary | ICD-10-CM | POA: Diagnosis not present

## 2024-02-05 DIAGNOSIS — Z1332 Encounter for screening for maternal depression: Secondary | ICD-10-CM | POA: Diagnosis not present

## 2024-02-16 DIAGNOSIS — Z7689 Persons encountering health services in other specified circumstances: Secondary | ICD-10-CM | POA: Diagnosis not present

## 2024-02-23 DIAGNOSIS — Z3202 Encounter for pregnancy test, result negative: Secondary | ICD-10-CM | POA: Diagnosis not present

## 2024-02-25 ENCOUNTER — Ambulatory Visit: Admitting: Family

## 2024-02-25 ENCOUNTER — Encounter: Payer: Self-pay | Admitting: Family

## 2024-02-25 VITALS — HR 60 | Temp 98.5°F | Ht 62.0 in | Wt 172.0 lb

## 2024-02-25 DIAGNOSIS — Z789 Other specified health status: Secondary | ICD-10-CM | POA: Diagnosis not present

## 2024-02-25 DIAGNOSIS — D688 Other specified coagulation defects: Secondary | ICD-10-CM | POA: Diagnosis not present

## 2024-02-25 DIAGNOSIS — Z23 Encounter for immunization: Secondary | ICD-10-CM

## 2024-02-25 DIAGNOSIS — D500 Iron deficiency anemia secondary to blood loss (chronic): Secondary | ICD-10-CM | POA: Diagnosis not present

## 2024-02-25 DIAGNOSIS — H6691 Otitis media, unspecified, right ear: Secondary | ICD-10-CM

## 2024-02-25 DIAGNOSIS — Z0000 Encounter for general adult medical examination without abnormal findings: Secondary | ICD-10-CM | POA: Insufficient documentation

## 2024-02-25 DIAGNOSIS — Z0001 Encounter for general adult medical examination with abnormal findings: Secondary | ICD-10-CM

## 2024-02-25 DIAGNOSIS — Z13220 Encounter for screening for lipoid disorders: Secondary | ICD-10-CM | POA: Diagnosis not present

## 2024-02-25 LAB — LIPID PANEL
Cholesterol: 196 mg/dL (ref 0–200)
HDL: 63.4 mg/dL (ref >39.00)
LDL Cholesterol: 113 mg/dL — ABNORMAL HIGH (ref 0–99)
NonHDL: 132.84
Total CHOL/HDL Ratio: 3
Triglycerides: 100 mg/dL (ref 0.0–149.0)
VLDL: 20 mg/dL (ref 0.0–40.0)

## 2024-02-25 LAB — CBC
HCT: 39.9 % (ref 36.0–46.0)
Hemoglobin: 13 g/dL (ref 12.0–15.0)
MCHC: 32.6 g/dL (ref 30.0–36.0)
MCV: 90.2 fl (ref 78.0–100.0)
Platelets: 348 K/uL (ref 150.0–400.0)
RBC: 4.43 Mil/uL (ref 3.87–5.11)
RDW: 13.8 % (ref 11.5–15.5)
WBC: 6.1 K/uL (ref 4.0–10.5)

## 2024-02-25 LAB — IBC + FERRITIN
Ferritin: 13.8 ng/mL (ref 10.0–291.0)
Iron: 97 ug/dL (ref 42–145)
Saturation Ratios: 25 % (ref 20.0–50.0)
TIBC: 387.8 ug/dL (ref 250.0–450.0)
Transferrin: 277 mg/dL (ref 212.0–360.0)

## 2024-02-25 LAB — TSH: TSH: 0.91 u[IU]/mL (ref 0.35–5.50)

## 2024-02-25 MED ORDER — AMOXICILLIN 875 MG PO TABS: 875.0000 mg | ORAL_TABLET | Freq: Two times a day (BID) | ORAL | 0 refills | Status: AC

## 2024-02-25 NOTE — Progress Notes (Signed)
 Established Patient Office Visit  Subjective:      CC:  Chief Complaint  Patient presents with   Establish Care    HPI: Susan Hoffman  Susan Hoffman Hoffman is a 33 y.o. female presenting on 02/25/2024 for Establish Care .  Discussed the use of AI scribe software for clinical note transcription with the patient, who gave verbal consent to proceed.  History of Present Illness Susan Hoffman  Susan Hoffman Hoffman is a 33 year old female who presents for a primary care visit and follow-up postpartum care. She is accompanied by her baby, Susan Hoffman Hoffman.  She is currently breastfeeding and supplementing with formula due to insufficient milk production. She experiences ongoing postpartum bleeding, which has decreased but still requires daily pad use. Post-delivery, she had significant complications, including a ruptured hematoma and extensive internal tearing, necessitating an epidural and extensive stitches. She continues to experience pain.  She has a history of anemia, which worsened during pregnancy, and she continues to take iron and prenatal vitamins. 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  She reports partial deafness in her right ear and has been experiencing recurrent ear infections since pregnancy, with the latest onset two days ago. Eardrops have been effective, and her last treatment was two to three months ago. She is not allergic to penicillin but has had diarrhea with cefdinir .  She follows a pescatarian diet, which she has adhered to since childhood due to familial dietary habits. She is currently on maternity leave and walks five to six days a week for an hour each day. She is also performing pelvic floor exercises daily.  She has a family history of lung cancer in her maternal grandfather and breast cancer in her maternal aunt, who was diagnosed in her fifties. Her father has high  blood pressure and hypothyroidism. She experienced an early miscarriage last year before her current pregnancy.  She has a history of an abnormal Pap smear, but her last Pap smear before pregnancy was normal. She is allergic to codeine and mushrooms, with a history of an anaphylactic reaction to the COVID vaccine.         Social history:  Relevant past medical, surgical, family and social history reviewed and updated as indicated. Interim medical history since our last visit reviewed.  Allergies and medications reviewed and updated.  DATA REVIEWED: CHART IN EPIC     ROS: Negative unless specifically indicated above in HPI.    Current Outpatient Medications:    Ferrous Sulfate (IRON PO), Take 1 tablet by mouth daily., Disp: , Rfl:    Prenatal MV-Min-Fe Fum-FA-DHA (PRENATAL 1 PO), , Disp: , Rfl:         Objective:        Pulse 60   Temp 98.5 F (36.9 C) (Temporal)   Ht 5' 2 (1.575 m)   Wt 172 lb (78 kg)   LMP 04/08/2023   SpO2 98%   Breastfeeding Yes   BMI 31.46 kg/m   Physical Exam HEENT: Right tympanic membrane is erythematous.  Wt Readings from Last 3 Encounters:  02/25/24 172 lb (78 kg)  12/16/23 198 lb (89.8 kg)  06/03/23 150 lb (68 kg)    Physical Exam Vitals reviewed.  Constitutional:      General: She is not in acute distress.    Appearance: Normal appearance. She is obese. She is not ill-appearing.  HENT:  Head: Normocephalic.     Right Ear: Tympanic membrane is erythematous.     Left Ear: Tympanic membrane normal.     Ears:     Comments: Non intact TM Canal also with erythema    Nose: Nose normal.     Mouth/Throat:     Mouth: Mucous membranes are moist.  Eyes:     Extraocular Movements: Extraocular movements intact.     Pupils: Pupils are equal, round, and reactive to light.  Cardiovascular:     Rate and Rhythm: Normal rate and regular rhythm.  Pulmonary:     Effort: Pulmonary effort is normal.     Breath sounds: Normal  breath sounds.  Abdominal:     General: Abdomen is flat. Bowel sounds are normal.     Palpations: Abdomen is soft.     Tenderness: There is no guarding or rebound.  Musculoskeletal:        General: Normal range of motion.     Cervical back: Normal range of motion.  Skin:    General: Skin is warm.     Capillary Refill: Capillary refill takes less than 2 seconds.  Neurological:     General: No focal deficit present.     Mental Status: She is alert.  Psychiatric:        Mood and Affect: Mood normal.        Behavior: Behavior normal.        Thought Content: Thought content normal.        Judgment: Judgment normal.    we      Results LABS WBC: elevated (01/12/2024)  Assessment & Plan:   Assessment and Plan Assessment & Plan Adult Wellness Visit Postpartum status with ongoing bleeding and pain due to extensive internal tearing and hematoma rupture post-delivery. Engages in regular walking and pelvic floor exercises. Follows a pescatarian diet for inflammatory reasons. Abnormal Pap smear history with subsequent normal result. No current alcohol or tobacco use. On maternity leave. Miscarriage history. Family history of lung and breast cancer. Advanced directive in place but not on file. - Perform physical examination - Administer flu vaccine - Repeat blood work - [employer] paper copy of advanced directive for records -Patient Counseling(The following topics were reviewed):  Preventative care handout given to pt  Health maintenance and immunizations reviewed. Please refer to Health maintenance section. Pt advised on safe sex, wearing seatbelts in car, and proper nutrition labwork ordered today for annual Dental health: Discussed importance of regular tooth brushing, flossing, and dental visits.   Otitis media, right ear Recurrent otitis media in the right ear since pregnancy. Current episode started 2 days ago with redness in the tympanic membrane area. 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 No full eardrum present. Likely exacerbated by environmental factors such as construction at home. - Prescribe amoxicillin 875 mg bid x 10 days, deemed safe for breastfeeding  Iron deficiency anemia secondary to blood loss (chronic) Chronic iron deficiency anemia, worsened during pregnancy. Currently taking iron supplements and prenatal vitamins. Significant blood loss post-delivery due to internal tearing and hematoma rupture. 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 Increased risk due to Ashkenazi Jewish heritage.       Return in about 1 year (around 02/24/2025) for f/u CPE.     Ginger Patrick, MSN, APRN, FNP-C [facility]

## 2024-02-26 ENCOUNTER — Ambulatory Visit: Payer: Self-pay | Admitting: Family

## 2024-03-02 DIAGNOSIS — Z1332 Encounter for screening for maternal depression: Secondary | ICD-10-CM | POA: Diagnosis not present

## 2024-03-03 ENCOUNTER — Encounter: Payer: Self-pay | Admitting: Family

## 2024-03-03 ENCOUNTER — Other Ambulatory Visit: Payer: Self-pay

## 2024-03-03 DIAGNOSIS — H66001 Acute suppurative otitis media without spontaneous rupture of ear drum, right ear: Secondary | ICD-10-CM

## 2024-03-03 MED ORDER — CIPROFLOXACIN-DEXAMETHASONE 0.3-0.1 % OT SUSP
4.0000 [drp] | Freq: Two times a day (BID) | OTIC | 0 refills | Status: DC
  Filled 2024-03-03: qty 7.5, 7d supply, fill #0

## 2024-03-03 MED ORDER — CIPROFLOXACIN-DEXAMETHASONE 0.3-0.1 % OT SUSP: 4.0000 [drp] | Freq: Two times a day (BID) | OTIC | 0 refills | Status: AC

## 2024-03-03 NOTE — Addendum Note (Signed)
 Addended by: CORWIN ANTU on: 03/03/2024 12:41 PM   Modules accepted: Orders

## 2024-03-03 NOTE — Addendum Note (Signed)
 Addended by: CORWIN ANTU on: 03/03/2024 01:36 PM   Modules accepted: Orders

## 2024-03-09 DIAGNOSIS — L929 Granulomatous disorder of the skin and subcutaneous tissue, unspecified: Secondary | ICD-10-CM | POA: Diagnosis not present

## 2024-03-09 DIAGNOSIS — O924 Hypogalactia: Secondary | ICD-10-CM | POA: Diagnosis not present

## 2024-03-22 ENCOUNTER — Encounter: Payer: Self-pay | Admitting: Family

## 2024-03-22 DIAGNOSIS — M62838 Other muscle spasm: Secondary | ICD-10-CM | POA: Diagnosis not present

## 2024-03-22 DIAGNOSIS — L929 Granulomatous disorder of the skin and subcutaneous tissue, unspecified: Secondary | ICD-10-CM | POA: Diagnosis not present

## 2024-03-22 DIAGNOSIS — E079 Disorder of thyroid, unspecified: Secondary | ICD-10-CM | POA: Diagnosis not present

## 2024-03-28 DIAGNOSIS — N61 Mastitis without abscess: Secondary | ICD-10-CM | POA: Diagnosis not present

## 2024-04-05 ENCOUNTER — Encounter: Payer: Self-pay | Admitting: Family

## 2024-04-05 ENCOUNTER — Telehealth: Admitting: Family

## 2024-04-05 DIAGNOSIS — N61 Mastitis without abscess: Secondary | ICD-10-CM | POA: Insufficient documentation

## 2024-04-05 MED ORDER — DICLOXACILLIN SODIUM 500 MG PO CAPS: 500.0000 mg | ORAL_CAPSULE | Freq: Four times a day (QID) | ORAL | 0 refills | Status: AC

## 2024-04-05 NOTE — Assessment & Plan Note (Signed)
 Extend dicloxacillin  x 4 more days for total 14 days.  Please monitor site for worsening signs/symptoms of infection to include: increasing redness, increasing tenderness, increase in size, and or pustulant drainage from site. If this is to occur please let me know immediately.   If this occurs will consider change to bactrim bid x 10-14 days with close observation  Encouraged prior to feeding or pumping warm compresses and gentle massage to stimulate milk flow.  Cold packs, ibuprofen , tylenol prn pain.

## 2024-04-05 NOTE — Progress Notes (Signed)
 Virtual Visit via Video note  I connected with Susan  Ameena Hoffman on 04/05/24 at home by video and verified that I am speaking with the correct person using two identifiers.The provider, Ginger Patrick, FNP is located in their home at time of visit.  I discussed the limitations, risks, security and privacy concerns of performing an evaluation and management service by video and the availability of in person appointments. I also discussed with the patient that there may be a patient responsible charge related to this service. The patient expressed understanding and agreed to proceed.  Subjective: PCP: Patrick Ginger, FNP  Chief Complaint  Patient presents with   Follow-up    Follow up from most recent urgent care visit. Was diagnosed with mastitis and was put on antibiotics. She is still having a low supply of breast milk production.    HPI   Went to urgent care 12/1 for mastitis. She was given dicloxacillin  500 mg QID for ten days. Recommendation were given for massage to site, use of cold packs, and to wear loose fitting bras. Encouraged good fluid intake and rest. Ibuprofen  tylenol prn pain.she is pumping every 3-4 hours from that breast. She has not had a fever.   She states there has been good improvement in her breast but it is still pink, she still feels some soreness and her milk production is decreased significantly on that side.       ROS: Per HPI  Current Outpatient Medications:    dicloxacillin  (DYNAPEN ) 500 MG capsule, Take 1 capsule (500 mg total) by mouth in the morning, at noon, in the evening, and at bedtime for 4 days., Disp: 16 capsule, Rfl: 0   Ferrous Sulfate (IRON PO), Take 1 tablet by mouth daily., Disp: , Rfl:    Prenatal MV-Min-Fe Fum-FA-DHA (PRENATAL 1 PO), , Disp: , Rfl:   Observations/Objective: Physical Exam Constitutional:      General: She is not in acute distress.    Appearance: Normal appearance. She is not ill-appearing.  Pulmonary:      Effort: Pulmonary effort is normal.  Neurological:     General: No focal deficit present.     Mental Status: She is alert and oriented to person, place, and time.  Psychiatric:        Mood and Affect: Mood normal.        Behavior: Behavior normal.        Thought Content: Thought content normal.     Assessment and Plan: Acute mastitis of right breast Assessment & Plan: Extend dicloxacillin  x 4 more days for total 14 days.  Please monitor site for worsening signs/symptoms of infection to include: increasing redness, increasing tenderness, increase in size, and or pustulant drainage from site. If this is to occur please let me know immediately.   If this occurs will consider change to bactrim bid x 10-14 days with close observation  Encouraged prior to feeding or pumping warm compresses and gentle massage to stimulate milk flow.  Cold packs, ibuprofen , tylenol prn pain.     Orders: -     Dicloxacillin  Sodium; Take 1 capsule (500 mg total) by mouth in the morning, at noon, in the evening, and at bedtime for 4 days.  Dispense: 16 capsule; Refill: 0    Follow Up Instructions: Return if symptoms worsen or fail to improve.   I discussed the assessment and treatment plan with the patient. The patient was provided an opportunity to ask questions and all were answered. The patient agreed  with the plan and demonstrated an understanding of the instructions.   The patient was advised to call back or seek an in-person evaluation if the symptoms worsen or if the condition fails to improve as anticipated.  The above assessment and management plan was discussed with the patient. The patient verbalized understanding of and has agreed to the management plan. Patient is aware to call the clinic if symptoms persist or worsen. Patient is aware when to return to the clinic for a follow-up visit. Patient educated on when it is appropriate to go to the emergency department.     Ginger Patrick, MSN, APRN,  FNP-C [facility]

## 2024-04-15 DIAGNOSIS — Z9189 Other specified personal risk factors, not elsewhere classified: Secondary | ICD-10-CM | POA: Diagnosis not present

## 2024-04-15 DIAGNOSIS — R32 Unspecified urinary incontinence: Secondary | ICD-10-CM | POA: Diagnosis not present

## 2024-04-15 DIAGNOSIS — L929 Granulomatous disorder of the skin and subcutaneous tissue, unspecified: Secondary | ICD-10-CM | POA: Diagnosis not present

## 2024-04-15 DIAGNOSIS — N898 Other specified noninflammatory disorders of vagina: Secondary | ICD-10-CM | POA: Diagnosis not present

## 2024-04-20 NOTE — Telephone Encounter (Signed)
 I would prefer she is seen in person.  Urgent care unless we have openings?  Just bc last bout of mastitis was so complex and we already did 14 days of that one prior

## 2024-05-09 ENCOUNTER — Ambulatory Visit

## 2024-05-17 ENCOUNTER — Other Ambulatory Visit: Payer: Self-pay

## 2024-05-17 ENCOUNTER — Ambulatory Visit

## 2024-05-17 DIAGNOSIS — M6281 Muscle weakness (generalized): Secondary | ICD-10-CM | POA: Insufficient documentation

## 2024-05-17 DIAGNOSIS — R102 Pelvic and perineal pain unspecified side: Secondary | ICD-10-CM | POA: Insufficient documentation

## 2024-05-17 DIAGNOSIS — O26899 Other specified pregnancy related conditions, unspecified trimester: Secondary | ICD-10-CM | POA: Insufficient documentation

## 2024-05-17 DIAGNOSIS — R2689 Other abnormalities of gait and mobility: Secondary | ICD-10-CM | POA: Insufficient documentation

## 2024-05-17 DIAGNOSIS — N3946 Mixed incontinence: Secondary | ICD-10-CM | POA: Insufficient documentation

## 2024-05-17 NOTE — Therapy (Addendum)
 " OUTPATIENT PHYSICAL THERAPY FEMALE PELVIC EVALUATION   Patient Name: Susan  Darchelle Hoffman MRN: [id number] DOB:Oct 26, 1990, 34 y.o., female Today's Date: 05/17/2024  END OF SESSION:  PT End of Session - 05/17/24 1403     Visit Number 1   Number of Visits 9    Date for Recertification  07/16/24    Authorization Type [employer] and [place] [employer]    Progress Note Due on Visit 10    PT Start Time 1357    PT Stop Time 1440    PT Time Calculation (min) 43 min    Activity Tolerance Patient tolerated treatment well    Behavior During Therapy [facility] for tasks assessed/performed          Past Medical History:  Diagnosis Date   Abnormal cells of cervix 2016   Was told needs PAP every 6 months due to abnormal cells.    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   Attention deficit hyperactivity disorder (ADHD), predominantly hyperactive type 02/13/2017   Nummular dermatitis 02/13/2017   Deafness in right ear 02/13/2017   Migraine headache with aura 01/07/2012    PCP: Ginger Patrick, MD  REFERRING PROVIDER: Sima Seitz FNP  REFERRING DIAG: Obstetrical laceration and UI  THERAPY DIAG:  Other abnormalities of gait and mobility  Muscle weakness (generalized)  Pelvic pain  Mixed incontinence  Rationale for Evaluation and Treatment: Rehabilitation  ONSET DATE: 04/26/24 referral date  SUBJECTIVE:                                                                                                                                                                                           SUBJECTIVE STATEMENT:  URINARY FUNCTION: Pt stated frequency is normal throughout the day. The  incontinence occurs 2-3x/day. Pt reported she experiences leakage when going from sitting to standing from the toilet. SUI with vomiting last week with GI virus.  BOWEL FUNCTION: still has hemorrhoids but no bowel issues.  SEXUAL FUNCTION: no pain with intercourse but still feels laceration internally at all times, especially with squatting.  One period since 12/2023 delivery, moderate bleeding, migraine (hx of migraines), really bad cramping for two of six days of period. Pt used pads for now. She feels like PFM are weak but no  issues climaxing. No issues with dryness.  CORE STABILITY: herniated two lumbar spine discs during labor and delivery and has RLE and hip N/T, R foot is the worse. Pt did have balance issues 2/2 N/T but has been performing SLS exercises and steps were challenging at first. Better in the morning, worse with activity-especially at the end of the day. Worse with heavy lifting of patients. 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 The laceration was cervix to nearly vaginal opening during labor, it is still healing (close to cervix)-she goes once a month but did not have to use silver nitrate last visit. Had a hematoma somewhere internally. Laceration is more of an ache, at worst: 4/10, at best: 0/10. It was 8/10 at first. Pt is breastfeeding.   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 No alcohol.    FUNCTIONAL LIMITATIONS: squatting, toileting, heavy lifting at work, limited hip ext in general and weaker   PERTINENT HISTORY:  Medications for current condition: none Surgeries: none but stitches for laceration Other: Migraine, deafness in R ear, miscarriage 2024, PP 12/2023 Sexual abuse: No  PAIN:  Are you having pain? Yes NPRS scale: 1/10 Pain location: Internal  Pain type: tight Pain description: constant   Aggravating factors: squatting, heavy lifting Relieving factors: rest,  stretching  PRECAUTIONS: None N/T in RLE from herniated discs  RED FLAGS: None   WEIGHT BEARING RESTRICTIONS: No  FALLS:  Has patient fallen in last 6 months? 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 (chest/tri, back/bi, leg/shoulders day and core exercises)   PLOF: Independent  PATIENT GOALS: To not have the incontinence, help with the strength deficits    BOWEL MOVEMENT: Pain with bowel movement: Yes 2/2 hemorrhoids  Type of bowel movement:Frequency daily  Fully empty rectum: Yes:   Leakage: No                                                  Caused by:  Bowel urgency:  Pads: No Fiber supplement/laxative: No  URINATION: Pain with urination: No Fully empty bladder: Yes:                                           Post-void dribble: Yes  Stream: Strong Urgency: No Frequency:during the day every few hours                                                        Nocturia: Yes: goes multiple times a night   Leakage: Exercise and vomiting Pads/briefs: No   INTERCOURSE:  Ability to have vaginal penetration Yes  Pain with intercourse: none Dryness: Yes  Climax: no issues Marinoff Scale: 1/3 Lubricant: none  PREGNANCY: Number of prenancies: 2 Vaginal deliveries 1 Tearing Yes: cervical to vaginal opening Episiotomy No C-section deliveries 0 Currently pregnant No  PROLAPSE: None   OBJECTIVE:  Note: Objective measures were  completed at Evaluation unless otherwise noted.    COGNITION: Overall cognitive status: Within functional limits for tasks assessed     SENSATION: Light touch: Deficits R hip/LE  FUNCTIONAL TESTS:  Single leg stance:  Rt: incr. Postural sway   Lt: WNL Squat: with shoes-post. Pelvic tilt at end range, without shoes: no posterior pelvic tilt noted  GAIT: Assistive device utilized: None Comments: decr. Trunk rot and hip  ext.  POSTURE: increased lumbar lordosis and anterior pelvic tilt   LUMBARAROM/PROM:  A/PROM A/PROM  Eval (% available)  Flexion WFL  Extension WFL with concordant LBP  Right lateral flexion WFL with concordant LBP  Left lateral flexion WFL with concordant LBP  Right rotation Limited by approx. 25%  Left rotation Limited by approx. 25%   (Blank rows = not tested)  LOWER EXTREMITY ROM: Passive B hip IR/ER in hooklying WFL however, pt reported hip tension when seated with L hip in ER and R hip in IR.  Passive ROM Right eval Left eval  Hip flexion    Hip extension    Hip abduction    Hip adduction    Hip internal rotation    Hip external rotation    Knee flexion    Knee extension    Ankle dorsiflexion    Ankle plantarflexion    Ankle inversion    Ankle eversion     (Blank rows = not tested)  LOWER EXTREMITY MMT:   MMT Right eval Left eval  Hip flexion    Hip extension    Hip abduction    Hip adduction    Hip internal rotation    Hip external rotation    Knee flexion    Knee extension    Ankle dorsiflexion    Ankle plantarflexion    Ankle inversion    Ankle eversion     (Blank rows = not tested) PALPATION:  General: TTP over L5 in standing  Pelvic Alignment: anterior pelvic tilt  Abdominal: incr. Tension at diaphragm (B), slight incr. Intrasternal angle  Diastasis: Yes: at umbilicus (1.25 fingers width at umbilicus and inf. To umbilicus (1 fingers width) which decr. With chin to chest/abdominal activation)  Blank= not tested Distortion:   Breathing:  Scar tissue:  Active Straight Leg Raise:                 External Perineal Exam:                              Internal Pelvic Floor:   Patient confirms identification and approves PT to assess internal pelvic floor and treatment  All internal or external pelvic floor assessments and/or treatments are completed with proper hand hygiene and gloves hands. If needed gloves are changed with hand hygiene  during patient care time.  PELVIC MMT:   MMT eval  Vaginal   Internal Anal Sphincter   External Anal Sphincter   Puborectalis   (Blank rows = not tested)        TONE: limited by time constraints  PROLAPSE: limited by time constraints   TODAY'S TREATMENT:  DATE: 05/18/24   EVAL   NMR: Access Code: [email or web address] URL: [email or web address] Date: 05/17/2024 Prepared by: Delon Pinal  Exercises - Child's Pose with Thread the Needle  - 1 x daily - 7 x weekly - 1 sets - 5-10 reps - Cat Cow  - 1 x daily - 7 x weekly - 1 sets - 5 reps - Supine Diaphragmatic Breathing  - 1 x daily - 7 x weekly - 1 sets - 5 reps Cues and demo for proper technique. S for safety. No pain reported.    PATIENT EDUCATION:  Education details: PT educated pt on main functions of the pelvic floor, IAP, breath and PFM relationship. PT discussed POC, frequency and duration. 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 Person educated: Patient Education method: Explanation, Demonstration, and Handouts Education comprehension: verbalized understanding, returned demonstration, and needs further education  HOME EXERCISE PROGRAM: [email or web address]  ASSESSMENT:  CLINICAL IMPRESSION: Patient is a pleasant 34 y.o. female who was seen today for physical therapy evaluation and treatment for obstetrical laceration and UI.  Pt's PMH is significant for the following: Migraine, deafness in R ear, miscarriage 2024, PP 12/2023. The following impairments were noted upon exam: limited ROM, back and pelvic pain, obstetrical laceration, postural dysfunction, impaired R SLS balance, RLE N/T, decr. Strength likely 2/2 subjective reports and gait deviations, nocturia (but pt is up to breastfeed at night, which is  likely incr. Nocturia), and SUI. Pt would benefit from skilled PT to improve safety and decr. Pain during all ADLs.   OBJECTIVE IMPAIRMENTS: Abnormal gait, decreased balance, decreased coordination, decreased ROM, decreased strength, hypomobility, increased fascial restrictions, improper body mechanics, postural dysfunction, and pain.   ACTIVITY LIMITATIONS: carrying, lifting, bending, squatting, transfers, continence, toileting, locomotion level, and caring for others  PARTICIPATION LIMITATIONS: cleaning, laundry, interpersonal relationship, and occupation  PERSONAL FACTORS: Past/current experiences and 3+ comorbidities: see above are also affecting patient's functional outcome.   REHAB POTENTIAL: Excellent  CLINICAL DECISION MAKING: Evolving/moderate complexity obstetrical laceration is not fully healed-being monitored by OBGYN  EVALUATION COMPLEXITY: Low   GOALS: Goals reviewed with patient? Yes  SHORT TERM GOALS: Target date: for all STGs: 06/14/24  Pt will be IND in HEP to improve pain, strength, coordination. Baseline: pt working out but with compensatory strategy Goal status: INITIAL  2.  Finish exam and write goals as indicated. Baseline: limited by time constraints Goal status: INITIAL  3.  Pt will demonstrated improved relaxation and contraction of PFM with coordination of breath to reduce urinary leakage to </=three/week. Baseline: 2-3x/day Goal status: INITIAL   LONG TERM GOALS: Target date: for all LTGs: 07/12/24  Pt will demonstrated improved relaxation and contraction of PFM with coordination of breath to reduce urinary leakage to </=once/week. Baseline: 2-3x/day Goal status: INITIAL  2.  Pt will demonstrate improved relaxation and contraction of pelvic floor muscles (PFM) with coordination of breath to decr. discomfort with intercourse with spouse. Baseline: 1/3 marinoff scale with positions limited 2/2 laceration Goal status: INITIAL  3.  Pt will report  being able to strength train 3-4x/week without leakage or pelvic pain to maximize gains made in PT and improve QOL. Baseline: strength trains with compensatory strategies and limited 2/2 pain and leakage. Goal status: INITIAL  PLAN: finish exam (palpation, ROM, MMT, DR) review and HEP. Hip ext vs. Hamstrings.   PT FREQUENCY: 1x/week  PT DURATION: 8 weeks  PLANNED INTERVENTIONS: 02835- PT  Re-evaluation, 97110-Therapeutic exercises, 97530- Therapeutic activity, [id number]- Neuromuscular re-education, [phone]- Self Care, 02859- Manual therapy, [phone]- Gait training, [phone] (1-2 muscles), [id number] (3+ muscles)- Dry Needling, Patient/Family education, Balance training, Stair training, Taping, Joint mobilization, Joint manipulation, Spinal manipulation, Spinal mobilization, Scar mobilization, Moist heat, and Biofeedback   Dorlis Judice L, PT 05/17/2024, 2:04 PM  Delon Pinal, PT,DPT 05/17/24 2:04 PM Phone: [phone] Fax: [phone]  "

## 2024-05-20 ENCOUNTER — Other Ambulatory Visit: Payer: Self-pay

## 2024-05-20 MED ORDER — NORETHINDRONE 0.35 MG PO TABS
1.0000 | ORAL_TABLET | Freq: Every day | ORAL | 3 refills | Status: AC
  Filled 2024-05-20: qty 84, 84d supply, fill #0

## 2024-05-24 ENCOUNTER — Other Ambulatory Visit: Payer: Self-pay

## 2024-05-24 ENCOUNTER — Ambulatory Visit

## 2024-05-24 DIAGNOSIS — R102 Pelvic and perineal pain unspecified side: Secondary | ICD-10-CM

## 2024-05-24 DIAGNOSIS — N3946 Mixed incontinence: Secondary | ICD-10-CM

## 2024-05-24 DIAGNOSIS — R2689 Other abnormalities of gait and mobility: Secondary | ICD-10-CM

## 2024-05-24 DIAGNOSIS — M6281 Muscle weakness (generalized): Secondary | ICD-10-CM

## 2024-05-24 NOTE — Patient Instructions (Signed)
~  Hip extension: prone with knee straight, contract glutes at 50% and then lift leg. 10 reps/side. Foam roll/massage gun for 10 seconds prior to glute activation activities and then for 30 seconds after activity. OR take a 5 minute brisk walk to activate glutes.  Bird dog: keep foot in contact with floor, do not lift leg yet.   Squats: no shoes.  90/90 pullbacks: in phone text.

## 2024-05-24 NOTE — Therapy (Addendum)
 " OUTPATIENT PHYSICAL THERAPY FEMALE PELVIC TREATMENT    Patient Name: Susan  Murray Hoffman MRN: [id number] DOB:[birth year], 34 y.o., female Today's Date: 05/24/2024  END OF SESSION:  PT End of Session - 05/17/24 1403     Visit Number 2   Number of Visits 9    Date for Recertification  07/16/24    Authorization Type [employer] and [place] [employer]    Progress Note Due on Visit 10    PT Start Time 1357    PT Stop Time 1445    PT Time Calculation (min) 47 min    Activity Tolerance Patient tolerated treatment well    Behavior During Therapy [facility] for tasks assessed/performed          Past Medical History:  Diagnosis Date   Abnormal cells of cervix 2016   Was told needs PAP every 6 months due to abnormal cells.    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   Attention deficit hyperactivity disorder (ADHD), predominantly hyperactive type 02/13/2017   Nummular dermatitis 02/13/2017   Deafness in right ear 02/13/2017   Migraine headache with aura 01/07/2012    PCP: Ginger Patrick, MD  REFERRING PROVIDER: Sima Seitz FNP  REFERRING DIAG: Obstetrical laceration and UI  THERAPY DIAG:  Other abnormalities of gait and mobility  Muscle weakness (generalized)  Pelvic pain  Mixed incontinence  Pelvic pressure in pregnancy  Rationale for Evaluation and Treatment: Rehabilitation  ONSET DATE: 04/26/24 referral date  SUBJECTIVE:                                                                                                                                                                                           SUBJECTIVE STATEMENT: Pt reported she went to [facility] and was told  her cervix is completely healed. She been performing TrA exercises seated vs. Lying down.   EVAL: URINARY FUNCTION: Pt stated frequency is normal throughout the day. The incontinence occurs 2-3x/day. Pt reported she experiences leakage when going from sitting to standing from the toilet. SUI with vomiting last week with GI virus.  BOWEL FUNCTION: still has hemorrhoids but no bowel issues.  SEXUAL FUNCTION: no pain with intercourse but still feels laceration internally at all times, especially with squatting.  One period since  12/2023 delivery, moderate bleeding, migraine (hx of migraines), really bad cramping for two of six days of period. Pt used pads for now. She feels like PFM are weak but no issues climaxing. No issues with dryness.  CORE STABILITY: herniated two lumbar spine discs during labor and delivery and has RLE and hip N/T, R foot is the worse. Pt did have balance issues 2/2 N/T but has been performing SLS exercises and steps were challenging at first. Better in the morning, worse with activity-especially at the end of the day. Worse with heavy lifting of patients. 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 The laceration was cervix to nearly vaginal opening during labor, it is still healing (close to cervix)-she goes once a month but did not have to use silver nitrate last visit. Had a hematoma somewhere internally. Laceration is more of an ache, at worst: 4/10, at best: 0/10. It was 8/10 at first. Pt is breastfeeding.   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 No alcohol.    FUNCTIONAL LIMITATIONS: squatting, toileting, heavy lifting at work, limited hip ext in general and weaker   PERTINENT HISTORY:  Medications for current condition: none Surgeries: none but stitches for laceration Other: Migraine, deafness in R ear, miscarriage 2024, PP 12/2023 Sexual abuse: No  PAIN:  Are you having pain? Yes NPRS scale:  4/10 Pain location: low back  Pain type: achy and radiating 4 Pain description: constant   Aggravating factors: squatting, heavy lifting Relieving factors: rest, stretching  PRECAUTIONS: None N/T in RLE from herniated discs  RED FLAGS: None   WEIGHT BEARING RESTRICTIONS: No  FALLS:  Has patient fallen in last 6 months? 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 (chest/tri, back/bi, leg/shoulders day and core exercises)   PLOF: Independent  PATIENT GOALS: To not have the incontinence, help with the strength deficits    BOWEL MOVEMENT: Pain with bowel movement: Yes 2/2 hemorrhoids  Type of bowel movement:Frequency daily  Fully empty rectum: Yes:   Leakage: No                                                  Caused by:  Bowel urgency:  Pads: No Fiber supplement/laxative: No  URINATION: Pain with urination: No Fully empty bladder: Yes:                                           Post-void dribble: Yes  Stream: Strong Urgency: No Frequency:during the day every few hours                                                        Nocturia: Yes: goes multiple times a night   Leakage: Exercise and vomiting Pads/briefs: No   INTERCOURSE:  Ability to have vaginal penetration Yes  Pain with intercourse: none Dryness: Yes  Climax: no issues Marinoff Scale: 1/3 Lubricant:  none  PREGNANCY: Number of prenancies: 2 Vaginal deliveries 1 Tearing Yes: cervical to vaginal opening Episiotomy No C-section deliveries 0 Currently pregnant No  PROLAPSE: None   OBJECTIVE:  Note: Objective measures were completed at Evaluation unless otherwise noted.    COGNITION: Overall cognitive status: Within functional limits for tasks assessed     SENSATION: Light touch: Deficits R hip/LE  FUNCTIONAL TESTS:  Single leg stance:  Rt: incr. Postural sway   Lt: WNL Squat:  with shoes-post. Pelvic tilt at end range, without shoes: no posterior pelvic tilt noted  GAIT: Assistive device utilized: None Comments: decr. Trunk rot and hip ext.  POSTURE: increased lumbar lordosis and anterior pelvic tilt   LUMBARAROM/PROM:  A/PROM A/PROM  Eval (% available)  Flexion WFL  Extension WFL with concordant LBP  Right lateral flexion WFL with concordant LBP  Left lateral flexion WFL with concordant LBP  Right rotation Limited by approx. 25%  Left rotation Limited by approx. 25%   (Blank rows = not tested)  LOWER EXTREMITY ROM: Passive B hip IR/ER in hooklying WFL however, pt reported hip tension when seated with L hip in ER and R hip in IR.  Passive ROM Right eval Left eval  Hip flexion    Hip extension    Hip abduction    Hip adduction    Hip internal rotation    Hip external rotation    Knee flexion    Knee extension    Ankle dorsiflexion    Ankle plantarflexion    Ankle inversion    Ankle eversion     (Blank rows = not tested)  LOWER EXTREMITY MMT:   MMT Right eval Left eval  Hip flexion 4 4+  Hip extension    Hip abduction 3+ 3+  Hip adduction 3+ 3+  Hip internal rotation 4 4+  Hip external rotation 4 4+  Knee flexion 3+ 4+  Knee extension 4+ 5  Ankle dorsiflexion 5 5  Ankle plantarflexion    Ankle inversion    Ankle eversion     (Blank rows = not tested) PALPATION:  General: TTP over L5 in standing  Pelvic Alignment: anterior pelvic tilt  Abdominal: incr. Tension at diaphragm (B), slight incr. Intrasternal angle  Diastasis: Yes: at umbilicus (1.25 fingers width at umbilicus and inf. To umbilicus (1 fingers width) which decr. With chin to chest/abdominal activation)  Blank= not tested Distortion:   Breathing:  Scar tissue:  Active Straight Leg Raise:   05/24/24 TTP over R glutes/hip rotators-feels irritated per pt and TTP tight distal hamstrings and calves. ,  hypomobility of upper tx spine, hip weakness. Incr. Infrasternal  rib angle and decr. Lat/post rib translation during diaphragmatic breathing.                 External Perineal Exam: Pt able to perform with cues for breath coordination but B glutes noted to also fire even with cues.                             Internal Pelvic Floor: not performed  All external pelvic floor assessments and/or treatments are completed with proper hand hygiene and gloves hands. If needed gloves are changed with hand hygiene during patient care time.  PELVIC MMT:   MMT eval  Vaginal   Internal Anal Sphincter   External Anal Sphincter   Puborectalis   (Blank rows = not tested)        TONE:  limited by time constraints  PROLAPSE: limited by time constraints   TODAY'S TREATMENT:                                                                                                                              DATE: 05/24/24   Physical function test: PT completed exam (palpation, MMT, DR, ROM). See above for details. B hip IR/ER was [facility] during testing in prone, however, pt noted to shift hips to allow more range. Provided pt with pullbacks to address.  MANUAL THERAPY: PT assessed and then performed skin rolling to B upper and lower abdominal area (more time spent on L upper abdominal and side flank area 2/2 incr. Tension), as pt reported L side tightness during rotational activities. PT then had pt perform to ensure proper technique. PT performed STM to B thoracolumbar junction to decr. Tension.   NMR: Access Code: [email or web address] URL: [email or web address] Date: 05/17/2024 Prepared by: Delon Pinal  Exercises - Child's Pose with Thread the Needle  - 1 x daily - 7 x weekly - 1 sets - 5-10 reps - Cat Cow  - 1 x daily - 7 x weekly - 1 sets - 5 reps - Supine Diaphragmatic Breathing  - 1 x daily - 7 x weekly - 1 sets - 5 reps PT reviewed pt exercises she's performing at home to ensure proper technique: -Bear planks with PT palpating abdominals with 2-3 second  hold -Hooklying marches with TrA activation-hip rocking noted. Janeal dog with palpation, PT had pt modify to keeping foot in contact to mat vs. Raising LE. -Prone hip ext (hamstrings fired first during palpation) but improved with cues and pt firing B glute max at 50% prior to performing hip ext with knee straight. -90/90 pullbacks to improve hip rotation x10 reps. Cues and demo for proper technique. S for safety. No pain reported.   SELF CARE: PATIENT EDUCATION:  Education details: PT educated pt how to progress HEP safely and properly and to hold off on running right now 2/2 hip abd/add weakness could incr. S/s and delay healing process. Person educated: Patient Education method: Explanation, Demonstration, and Handouts Education comprehension: verbalized understanding, returned demonstration, and needs further education  HOME EXERCISE PROGRAM: [email or web address]  ASSESSMENT:  CLINICAL IMPRESSION: Skilled session focused on completing exam (difficulty coordinating breath with PFM contraction without glute compensation, decr. Post/lab Rib translation with inhale, TTP over R SIJ (B SIJ hypermobile) and thoracolumbar junction and R glutes/hip rotators, and hypomobility upper tx spine, weak hips (R>L), B hamstrings firing prior to glutes during hip ext-improved with cues)).  The following impairments continue to be noted: limited ROM, back and pelvic pain, obstetrical laceration, postural dysfunction, impaired R SLS balance, RLE N/T, decr. Strength (hips and core), nocturia (but pt is up to breastfeed at night, which is likely incr. Nocturia), and SUI. Pt would continue to benefit from skilled PT to improve safety and decr. Pain during all ADLs.   OBJECTIVE IMPAIRMENTS: Abnormal  gait, decreased balance, decreased coordination, decreased ROM, decreased strength, hypomobility, increased fascial restrictions, improper body mechanics, postural dysfunction, and pain.   ACTIVITY LIMITATIONS: carrying,  lifting, bending, squatting, transfers, continence, toileting, locomotion level, and caring for others  PARTICIPATION LIMITATIONS: cleaning, laundry, interpersonal relationship, and occupation  PERSONAL FACTORS: Past/current experiences and 3+ comorbidities: see above are also affecting patient's functional outcome.   REHAB POTENTIAL: Excellent  CLINICAL DECISION MAKING: Evolving/moderate complexity obstetrical laceration is not fully healed-being monitored by OBGYN  EVALUATION COMPLEXITY: Low   GOALS: Goals reviewed with patient? Yes  SHORT TERM GOALS: Target date: for all STGs: 06/14/24  Pt will be IND in HEP to improve pain, strength, coordination. Baseline: pt working out but with compensatory strategy Goal status: INITIAL  2.  Finish exam and write goals as indicated. Baseline: limited by time constraints Goal status: MET  3.  Pt will demonstrated improved relaxation and contraction of PFM with coordination of breath to reduce urinary leakage to </=three/week. Baseline: 2-3x/day Goal status: INITIAL   LONG TERM GOALS: Target date: for all LTGs: 07/12/24  Pt will demonstrated improved relaxation and contraction of PFM with coordination of breath to reduce urinary leakage to </=once/week. Baseline: 2-3x/day Goal status: INITIAL  2.  Pt will demonstrate improved relaxation and contraction of pelvic floor muscles (PFM) with coordination of breath to decr. discomfort with intercourse with spouse. Baseline: 1/3 marinoff scale with positions limited 2/2 laceration Goal status: INITIAL  3.  Pt will report being able to strength train 3-4x/week without leakage or pelvic pain to maximize gains made in PT and improve QOL. Baseline: strength trains with compensatory strategies and limited 2/2 pain and leakage. Goal status: INITIAL  PLAN:progress HEP. Provide running exercises. Anti-rotational, overhead in half kneeling, monitor RDLs, side planks with add on bench?   PT  FREQUENCY: 1x/week  PT DURATION: 8 weeks  PLANNED INTERVENTIONS: 97164- PT Re-evaluation, 97110-Therapeutic exercises, 97530- Therapeutic activity, 97112- Neuromuscular re-education, 97535- Self Care, [id number]- Manual therapy, [phone]- Gait training, [phone] (1-2 muscles), [phone] (3+ muscles)- Dry Needling, Patient/Family education, Balance training, Stair training, Taping, Joint mobilization, Joint manipulation, Spinal manipulation, Spinal mobilization, Scar mobilization, Moist heat, and Biofeedback   Leno Mathes L, PT 05/24/2024, 1:58 PM  Delon Pinal, PT,DPT 05/24/24 1:58 PM Phone: [phone] Fax: [phone]  "

## 2024-06-14 ENCOUNTER — Ambulatory Visit

## 2024-06-21 ENCOUNTER — Ambulatory Visit

## 2024-06-28 ENCOUNTER — Ambulatory Visit

## 2024-07-05 ENCOUNTER — Ambulatory Visit

## 2024-07-12 ENCOUNTER — Ambulatory Visit

## 2024-10-07 ENCOUNTER — Encounter: Admitting: Family
